# Patient Record
Sex: Male | Born: 1950
Health system: Southern US, Community
[De-identification: ages and names within clinical notes are randomized; demographics above are authoritative.]

## PROBLEM LIST (undated history)

## (undated) DIAGNOSIS — M1990 Unspecified osteoarthritis, unspecified site: Secondary | ICD-10-CM

## (undated) DIAGNOSIS — I10 Essential (primary) hypertension: Secondary | ICD-10-CM

## (undated) DIAGNOSIS — N400 Enlarged prostate without lower urinary tract symptoms: Secondary | ICD-10-CM

## (undated) DIAGNOSIS — G5711 Meralgia paresthetica, right lower limb: Secondary | ICD-10-CM

## (undated) DIAGNOSIS — F419 Anxiety disorder, unspecified: Secondary | ICD-10-CM

## (undated) DIAGNOSIS — N189 Chronic kidney disease, unspecified: Secondary | ICD-10-CM

## (undated) DIAGNOSIS — F32A Depression, unspecified: Secondary | ICD-10-CM

## (undated) DIAGNOSIS — K219 Gastro-esophageal reflux disease without esophagitis: Secondary | ICD-10-CM

## (undated) DIAGNOSIS — H269 Unspecified cataract: Secondary | ICD-10-CM

## (undated) DIAGNOSIS — C4492 Squamous cell carcinoma of skin, unspecified: Secondary | ICD-10-CM

## (undated) DIAGNOSIS — F329 Major depressive disorder, single episode, unspecified: Secondary | ICD-10-CM

## (undated) DIAGNOSIS — Z5189 Encounter for other specified aftercare: Secondary | ICD-10-CM

## (undated) HISTORY — DX: Unspecified cataract: H26.9

## (undated) HISTORY — DX: Unspecified osteoarthritis, unspecified site: M19.90

## (undated) HISTORY — DX: Benign prostatic hyperplasia without lower urinary tract symptoms: N40.0

## (undated) HISTORY — DX: Encounter for other specified aftercare: Z51.89

## (undated) HISTORY — DX: Meralgia paresthetica, right lower limb: G57.11

## (undated) HISTORY — DX: Depression, unspecified: F32.A

## (undated) HISTORY — DX: Essential (primary) hypertension: I10

## (undated) HISTORY — DX: Chronic kidney disease, unspecified: N18.9

## (undated) HISTORY — DX: Major depressive disorder, single episode, unspecified: F32.9

## (undated) HISTORY — DX: Gastro-esophageal reflux disease without esophagitis: K21.9

## (undated) HISTORY — DX: Anxiety disorder, unspecified: F41.9

## (undated) HISTORY — PX: SPLENECTOMY: SUR1306

## (undated) HISTORY — DX: Squamous cell carcinoma of skin, unspecified: C44.92

## (undated) HISTORY — PX: HERNIA REPAIR: SHX51

---

## 2004-12-24 ENCOUNTER — Ambulatory Visit: Payer: Self-pay | Admitting: Internal Medicine

## 2005-06-30 ENCOUNTER — Ambulatory Visit: Payer: Self-pay | Admitting: Internal Medicine

## 2005-07-07 ENCOUNTER — Ambulatory Visit: Payer: Self-pay | Admitting: Internal Medicine

## 2007-02-09 ENCOUNTER — Ambulatory Visit: Payer: Self-pay | Admitting: Internal Medicine

## 2007-02-09 LAB — CONVERTED CEMR LAB
ALT: 20 units/L (ref 0–40)
Albumin: 3.4 g/dL — ABNORMAL LOW (ref 3.5–5.2)
Alkaline Phosphatase: 67 units/L (ref 39–117)
BUN: 10 mg/dL (ref 6–23)
Basophils Absolute: 0.1 10*3/uL (ref 0.0–0.1)
Basophils Relative: 0.8 % (ref 0.0–1.0)
Bilirubin Urine: NEGATIVE
CO2: 35 meq/L — ABNORMAL HIGH (ref 19–32)
Calcium: 9.4 mg/dL (ref 8.4–10.5)
Creatinine, Ser: 0.9 mg/dL (ref 0.4–1.5)
Hemoglobin, Urine: NEGATIVE
Hemoglobin: 15.6 g/dL (ref 13.0–17.0)
Ketones, ur: NEGATIVE mg/dL
LDL Cholesterol: 121 mg/dL — ABNORMAL HIGH (ref 0–99)
Leukocytes, UA: NEGATIVE
MCHC: 34 g/dL (ref 30.0–36.0)
Monocytes Relative: 13.4 % — ABNORMAL HIGH (ref 3.0–11.0)
Platelets: 311 10*3/uL (ref 150–400)
RBC: 4.76 M/uL (ref 4.22–5.81)
RDW: 12.7 % (ref 11.5–14.6)
Specific Gravity, Urine: 1.025 (ref 1.000–1.03)
TSH: 1.68 microintl units/mL (ref 0.35–5.50)
Total Bilirubin: 1 mg/dL (ref 0.3–1.2)
Total Protein, Urine: NEGATIVE mg/dL
Total Protein: 6.8 g/dL (ref 6.0–8.3)
Triglycerides: 124 mg/dL (ref 0–149)
Urine Glucose: NEGATIVE mg/dL
VLDL: 25 mg/dL (ref 0–40)
pH: 7 (ref 5.0–8.0)

## 2007-02-14 ENCOUNTER — Ambulatory Visit: Payer: Self-pay | Admitting: Internal Medicine

## 2007-03-01 ENCOUNTER — Ambulatory Visit: Payer: Self-pay

## 2007-04-06 ENCOUNTER — Ambulatory Visit: Payer: Self-pay | Admitting: Internal Medicine

## 2007-06-25 ENCOUNTER — Encounter: Payer: Self-pay | Admitting: Internal Medicine

## 2007-06-25 DIAGNOSIS — F411 Generalized anxiety disorder: Secondary | ICD-10-CM | POA: Insufficient documentation

## 2007-06-25 DIAGNOSIS — N400 Enlarged prostate without lower urinary tract symptoms: Secondary | ICD-10-CM | POA: Insufficient documentation

## 2007-06-25 DIAGNOSIS — F329 Major depressive disorder, single episode, unspecified: Secondary | ICD-10-CM

## 2007-06-25 DIAGNOSIS — F528 Other sexual dysfunction not due to a substance or known physiological condition: Secondary | ICD-10-CM | POA: Insufficient documentation

## 2007-06-25 DIAGNOSIS — F3289 Other specified depressive episodes: Secondary | ICD-10-CM | POA: Insufficient documentation

## 2007-06-25 DIAGNOSIS — I10 Essential (primary) hypertension: Secondary | ICD-10-CM | POA: Insufficient documentation

## 2007-06-25 DIAGNOSIS — K219 Gastro-esophageal reflux disease without esophagitis: Secondary | ICD-10-CM | POA: Insufficient documentation

## 2009-03-13 ENCOUNTER — Ambulatory Visit: Payer: Self-pay | Admitting: Internal Medicine

## 2009-03-13 LAB — CONVERTED CEMR LAB
Albumin: 3.7 g/dL (ref 3.5–5.2)
BUN: 10 mg/dL (ref 6–23)
Basophils Relative: 3.4 % — ABNORMAL HIGH (ref 0.0–3.0)
Chloride: 103 meq/L (ref 96–112)
Cholesterol: 207 mg/dL — ABNORMAL HIGH (ref 0–200)
Creatinine, Ser: 1 mg/dL (ref 0.4–1.5)
Direct LDL: 150.3 mg/dL
Eosinophils Relative: 1.4 % (ref 0.0–5.0)
GFR calc non Af Amer: 81.25 mL/min (ref >60)
HDL: 46.4 mg/dL (ref >39.00)
Hemoglobin, Urine: NEGATIVE
Leukocytes, UA: NEGATIVE
MCV: 96.8 fL (ref 78.0–100.0)
Monocytes Relative: 12.2 % — ABNORMAL HIGH (ref 3.0–12.0)
Neutrophils Relative %: 50.8 % (ref 43.0–77.0)
Nitrite: NEGATIVE
PSA: 2.2 ng/mL (ref 0.10–4.00)
Platelets: 290 10*3/uL (ref 150.0–400.0)
Potassium: 3.9 meq/L (ref 3.5–5.1)
RBC: 4.9 M/uL (ref 4.22–5.81)
Specific Gravity, Urine: 1.01 (ref 1.000–1.030)
TSH: 1.88 microintl units/mL (ref 0.35–5.50)
Total CHOL/HDL Ratio: 4
Total Protein: 7.3 g/dL (ref 6.0–8.3)
Urobilinogen, UA: 0.2 (ref 0.0–1.0)
VLDL: 13.4 mg/dL (ref 0.0–40.0)
WBC: 7.2 10*3/uL (ref 4.5–10.5)

## 2009-03-19 ENCOUNTER — Ambulatory Visit: Payer: Self-pay | Admitting: Internal Medicine

## 2009-03-19 DIAGNOSIS — G5710 Meralgia paresthetica, unspecified lower limb: Secondary | ICD-10-CM | POA: Insufficient documentation

## 2010-05-15 ENCOUNTER — Ambulatory Visit: Payer: Self-pay | Admitting: Internal Medicine

## 2010-05-19 LAB — CONVERTED CEMR LAB
ALT: 20 units/L (ref 0–53)
AST: 26 units/L (ref 0–37)
Albumin: 3.9 g/dL (ref 3.5–5.2)
Alkaline Phosphatase: 67 units/L (ref 39–117)
BUN: 19 mg/dL (ref 6–23)
Basophils Absolute: 0.1 10*3/uL (ref 0.0–0.1)
Bilirubin Urine: NEGATIVE
Calcium: 9.5 mg/dL (ref 8.4–10.5)
Chloride: 99 meq/L (ref 96–112)
Cholesterol: 206 mg/dL — ABNORMAL HIGH (ref 0–200)
Creatinine, Ser: 1 mg/dL (ref 0.4–1.5)
GFR calc non Af Amer: 85.86 mL/min (ref >60)
HCT: 46.3 % (ref 39.0–52.0)
Ketones, ur: NEGATIVE mg/dL
Leukocytes, UA: NEGATIVE
Lymphs Abs: 2.2 10*3/uL (ref 0.7–4.0)
MCV: 98.3 fL (ref 78.0–100.0)
Monocytes Absolute: 0.9 10*3/uL (ref 0.1–1.0)
Monocytes Relative: 10.6 % (ref 3.0–12.0)
Neutrophils Relative %: 59.9 % (ref 43.0–77.0)
Platelets: 316 10*3/uL (ref 150.0–400.0)
RDW: 13.9 % (ref 11.5–14.6)
TSH: 1.29 microintl units/mL (ref 0.35–5.50)
Total CHOL/HDL Ratio: 5
Urobilinogen, UA: 0.2 (ref 0.0–1.0)

## 2010-05-23 ENCOUNTER — Ambulatory Visit: Payer: Self-pay | Admitting: Internal Medicine

## 2010-05-23 ENCOUNTER — Encounter: Payer: Self-pay | Admitting: Internal Medicine

## 2010-05-23 DIAGNOSIS — K439 Ventral hernia without obstruction or gangrene: Secondary | ICD-10-CM | POA: Insufficient documentation

## 2010-06-12 ENCOUNTER — Encounter: Payer: Self-pay | Admitting: Internal Medicine

## 2010-08-08 ENCOUNTER — Encounter (INDEPENDENT_AMBULATORY_CARE_PROVIDER_SITE_OTHER): Payer: Self-pay | Admitting: *Deleted

## 2010-08-14 ENCOUNTER — Ambulatory Visit (HOSPITAL_COMMUNITY): Admission: RE | Admit: 2010-08-14 | Discharge: 2010-08-15 | Payer: Self-pay | Admitting: General Surgery

## 2010-09-22 ENCOUNTER — Ambulatory Visit: Payer: Self-pay | Admitting: Internal Medicine

## 2010-11-26 NOTE — Letter (Signed)
Summary: Referral - not able to see patient  [facility]  [address] [place], [place] [address]   Phone: [phone]  Fax: [phone]    August 08, 2010   Georgina Quint. Plotnikov, M.D. [address] [place], [place] [address]   Re:   Jeffrey [name] DOB:  Apr 14, 1951 MRN:   [id number]    Dear Dr. Posey Rea:  Thank you for your kind referral of the above patient.  We have attempted to schedule the recommended procedure Screening Colonoscopy but have not been able to schedule because:   X  The patient was not available by phone and/or has not returned our calls.  ___ The patient declined to schedule the procedure at this time.  We appreciate the referral and hope that we will have the opportunity to treat this patient in the future.    Sincerely,    [facility] [phone]

## 2010-11-26 NOTE — Consult Note (Signed)
Summary: [facility]   Imported By: Lester Bennett 07/03/2010 09:53:14  _____________________________________________________________________  External Attachment:    Type:   Image     Comment:   External Document

## 2010-11-26 NOTE — Assessment & Plan Note (Signed)
Summary: CPX/WELLPATH/#/CD   Vital Signs:  Patient profile:   60 year old male Height:      72 inches Weight:      250 pounds BMI:     34.03 O2 Sat:      94 % on Room air Temp:     98.4 degrees F oral Pulse rate:   83 / minute Pulse rhythm:   regular Resp:     16 per minute BP sitting:   140 / 90  (left arm) Cuff size:   regular  Vitals Entered By: Lanier Prude, CMA(AAMA) (May 23, 2010 10:45 AM)  O2 Flow:  Room air CC: CPX Is Patient Diabetic? No Comments pt states he is not  taking Ibuprofen or KlorCon   CC:  CPX.  History of Present Illness: The patient presents for a wellness examination  C/o R abd swelling and pain  Current Medications (verified): 1)  Flomax 0.4 Mg  Cp24 (Tamsulosin Hcl) .... Once Daily 2)  Accuretic 20-12.5 Mg  Tabs (Quinapril-Hydrochlorothiazide) .... Two Tabs Q Am 3)  Adult Aspirin Ec Low Strength 81 Mg  Tbec (Aspirin) .... Once Daily 4)  Klor-Con 20 Meq  Pack (Potassium Chloride) .... Once Daily 5)  Vitamin D 1000 Unit  Tabs (Cholecalciferol) .... Once Daily 6)  Ultram 50 Mg  Tabs (Tramadol Hcl) .... Qid As Needed 7)  Ibuprofen 600 Mg  Tabs (Ibuprofen) .... Qid As Needed 8)  Prilosec 20 Mg Cpdr (Omeprazole) .Marland Kitchen.. 1 Once Daily  Allergies (verified): 1)  ! Wellbutrin (Bupropion Hcl) 2)  ! Effexor 3)  ! Mevacor 4)  ! * Higher Dose of Accuretic  Past History:  Past Medical History: Last updated: 03/19/2009 Anxiety Depression GERD Hypertension Benign prostatic hypertrophy M paresthetica R  Past Surgical History: Last updated: 03/19/2009 Splenectomy  1988 Inguinal herniorrhaphy R  Family History: Last updated: 03/19/2009 HTN  Social History: Last updated: 05/23/2010 Occupation new job - :travels a lot Married Never Smoked Alcohol use-no Drug use-no Regular exercise-no  Social History: Occupation new job - :travels a lot Married Never Smoked Alcohol use-no Drug use-no Regular exercise-no  Review of Systems   The patient complains of abdominal pain.  The patient denies anorexia, fever, weight loss, weight gain, vision loss, decreased hearing, hoarseness, chest pain, syncope, dyspnea on exertion, peripheral edema, prolonged cough, headaches, hemoptysis, melena, hematochezia, severe indigestion/heartburn, hematuria, incontinence, genital sores, muscle weakness, suspicious skin lesions, transient blindness, difficulty walking, depression, unusual weight change, abnormal bleeding, enlarged lymph nodes, angioedema, and testicular masses.    Physical Exam  General:  overweight-appearing.   Head:  Normocephalic and atraumatic without obvious abnormalities. No apparent alopecia or balding. Eyes:  No corneal or conjunctival inflammation noted. EOMI. Perrla. Ears:  External ear exam shows no significant lesions or deformities.  Otoscopic examination reveals clear canals, tympanic membranes are intact bilaterally without bulging, retraction, inflammation or discharge. Hearing is grossly normal bilaterally. Nose:  External nasal examination shows no deformity or inflammation. Nasal mucosa are pink and moist without lesions or exudates. Mouth:  Oral mucosa and oropharynx without lesions or exudates.  Teeth in good repair. Neck:  No deformities, masses, or tenderness noted. Chest Wall:  No deformities, masses, tenderness or gynecomastia noted. Lungs:  Normal respiratory effort, chest expands symmetrically. Lungs are clear to auscultation, no crackles or wheezes. Heart:  Normal rate and regular rhythm. S1 and S2 normal without gallop, murmur, click, rub or other extra sounds. Abdomen:  Bowel sounds positive,abdomen large  soft and non-tender without masses,  organomegaly  A large midline hernia more to R is noted again and it  is bigger now. Midline scar is present Rectal:  No external abnormalities noted. Normal sphincter tone. No rectal masses or tenderness. G(-) Genitalia:  Testes bilaterally descended without  nodularity, tenderness or masses. No scrotal masses or lesions. No penis lesions or urethral discharge. Prostate:  1+ enlarged.   Msk:  No deformity or scoliosis noted of thoracic or lumbar spine.   Pulses:  R and L carotid,radial,femoral,dorsalis pedis and posterior tibial pulses are full and equal bilaterally Extremities:  No clubbing, cyanosis, edema, or deformity noted with normal full range of motion of all joints.   Neurologic:  No cranial nerve deficits noted. Station and gait are normal. Plantar reflexes are down-going bilaterally. DTRs are symmetrical throughout. Sensory, motor and coordinative functions appear intact. Skin:  Intact without suspicious lesions or rashes Cervical Nodes:  No lymphadenopathy noted Inguinal Nodes:  No significant adenopathy Psych:  Cognition and judgment appear intact. Alert and cooperative with normal attention span and concentration. No apparent delusions, illusions, hallucinations   Impression & Recommendations:  Problem # 1:  WELL ADULT EXAM (ICD-V70.0) Assessment New Health and age related issues were discussed. Available screening tests and vaccinations were discussed as well. Healthy life style including good diet and execise was discussed.  The labs were reviewed with the patient.  Orders: EKG w/ Interpretation (93000) Gastroenterology Referral (GI) colon  Problem # 2:  VENTRAL HERNIA (ICD-553.20) Assessment: Deteriorated  Orders: Surgical Referral (Surgery)  Problem # 3:  HYPERTENSION (ICD-401.9) Assessment: Unchanged  His updated medication list for this problem includes:    Accuretic 20-12.5 Mg Tabs (Quinapril-hydrochlorothiazide) .Marland Kitchen..Marland Kitchen Two tabs q am  BP today: 140/90 Prior BP: 124/86 (03/19/2009)  Labs Reviewed: K+: 4.1 (05/15/2010) Creat: : 1.0 (05/15/2010)   Chol: 206 (05/15/2010)   HDL: 43.20 (05/15/2010)   LDL: 121 (02/09/2007)   TG: 99.0 (05/15/2010)  Problem # 4:  DEPRESSION (ICD-311) Assessment: Improved  Problem #  5:  ERECTILE DYSFUNCTION (ICD-302.72) Assessment: Unchanged  Problem # 6:  SPLENECTOMY, TOTAL, HX OF (ICD-V45.79) Assessment: Comment Only Will vaccinate today Augmentin Rx  Problem # 7:  PSA, INCREASED (ICD-790.93) Assessment: Comment Only  Problem # 8:  MERALGIA PARESTHETICA (ICD-355.1) Assessment: Improved  Complete Medication List: 1)  Flomax 0.4 Mg Cp24 (Tamsulosin hcl) .... Once daily 2)  Accuretic 20-12.5 Mg Tabs (Quinapril-hydrochlorothiazide) .... Two tabs q am 3)  Adult Aspirin Ec Low Strength 81 Mg Tbec (Aspirin) .... Once daily 4)  Klor-con 20 Meq Pack (Potassium chloride) .... Once daily 5)  Vitamin D 1000 Unit Tabs (Cholecalciferol) .... Once daily 6)  Ultram 50 Mg Tabs (Tramadol hcl) .... Qid as needed 7)  Ibuprofen 600 Mg Tabs (Ibuprofen) .... Qid as needed 8)  Prilosec 20 Mg Cpdr (Omeprazole) .Marland Kitchen.. 1 once daily 9)  Augmentin 875-125 Mg Tabs (Amoxicillin-pot clavulanate) .Marland Kitchen.. 1 by mouth bid  Other Orders: Pneumococcal Vaccine ([address]) Admin 1st Vaccine (60454) Menactra IM (09811) Admin of Any Addtl Vaccine (91478)  Patient Instructions: 1)  Please schedule a follow-up appointment in 4 months. 2)  BMP prior to visit, ICD-9: 3)  Lipid Panel prior to visit, ICD-9:272.0 4)  PSA prior to visit, ICD-9:596.0 Prescriptions: AUGMENTIN 875-125 MG TABS (AMOXICILLIN-POT CLAVULANATE) 1 by mouth bid  #20 x 1   Entered and Authorized by:   Tresa Garter MD   Signed by:   Tresa Garter MD on 05/23/2010   Method used:   Print then Give to  Patient   RxID:   [id number] ACCURETIC 20-12.5 MG  TABS (QUINAPRIL-HYDROCHLOROTHIAZIDE) two tabs q am  #60 Tablet x 11   Entered and Authorized by:   Tresa Garter MD   Signed by:   Tresa Garter MD on 05/23/2010   Method used:   Print then Give to Patient   RxID:   [id number] FLOMAX 0.4 MG  CP24 (TAMSULOSIN HCL) once daily  #30 Capsule x 11   Entered and Authorized by:   Tresa Garter MD    Signed by:   Tresa Garter MD on 05/23/2010   Method used:   Print then Give to Patient   RxID:   [id number]    Immunizations Administered:  Pneumonia Vaccine:    Vaccine Type: Pneumovax    Site: left deltoid    Mfr: Merck    Dose: 0.5 ml    Route: IM    Given by: Lanier Prude, CMA(AAMA)    Exp. Date: 11/12/2011    Lot #: [id number]    VIS given: 05/23/96 version given May 23, 2010.  Meningococcal Vaccine:    Vaccine Type: Menactra    Site: right deltoid    Mfr: Sanofi Pasteur    Dose: 0.5 ml    Route: IM    Given by: Lanier Prude, CMA(AAMA)    Exp. Date: 07/14/2010    Lot #: [id number]    VIS given: 11/22/06 version given May 23, 2010.

## 2011-01-07 LAB — SURGICAL PCR SCREEN: Staphylococcus aureus: POSITIVE — AB

## 2011-01-07 LAB — COMPREHENSIVE METABOLIC PANEL
Alkaline Phosphatase: 65 U/L (ref 39–117)
BUN: 9 mg/dL (ref 6–23)
Calcium: 9.1 mg/dL (ref 8.4–10.5)
Glucose, Bld: 93 mg/dL (ref 70–99)
Total Protein: 6.9 g/dL (ref 6.0–8.3)

## 2011-01-07 LAB — DIFFERENTIAL
Basophils Relative: 1 % (ref 0–1)
Lymphs Abs: 2 10*3/uL (ref 0.7–4.0)
Monocytes Relative: 12 % (ref 3–12)
Neutro Abs: 3.6 10*3/uL (ref 1.7–7.7)
Neutrophils Relative %: 56 % (ref 43–77)

## 2011-01-07 LAB — CBC
HCT: 45.7 % (ref 39.0–52.0)
MCH: 33 pg (ref 26.0–34.0)
MCHC: 34 g/dL (ref 30.0–36.0)
MCV: 97.1 fL (ref 78.0–100.0)
RDW: 13.7 % (ref 11.5–15.5)

## 2011-01-07 LAB — PROTIME-INR: INR: 0.93 (ref 0.00–1.49)

## 2011-01-07 LAB — TYPE AND SCREEN: ABO/RH(D): B POS

## 2011-03-13 NOTE — Assessment & Plan Note (Signed)
[facility]                           PRIMARY CARE OFFICE NOTE   CALEM, COCOZZA                       MRN:          [id number]  DATE:02/14/2007                            DOB:          1951/08/17    HISTORY:  The patient is a 60 year old male who presents for a wellness  examination.  Past medical history, family history, and social history  as per July 07, 2005 note.  He got remarried about a year ago.   REVIEW OF SYSTEMS:  No chest pain or shortness of breath.  No syncope.  No neurologic complaints.  He has some myalgias and arthralgias that he  attributes to Mevacor.  The rest of the 18-point review of systems is  negative.   MEDICINES:  Reviewed with the patient.   PHYSICAL EXAMINATION:  VITAL SIGNS:  Blood pressure 151/89.  Pulse 65.  Temperature 98.8.  Weight 249 pounds (was 243).  Looks well.  HEENT:  Moist mucosa.  NECK:  Supple.  Mild right-sided bruit.  No thyromegaly.  LUNGS:  Clear.  No wheezes or rales.  HEART:  S1, S2.  No murmur, no gallop.  ABDOMEN:  Soft, nontender.  No organomegaly, no mass.  EXTREMITIES:  Lower extremities without edema.  NEUROLOGIC:  He is alert, oriented, cooperative.  Denies being  depressed.  SKIN:  Clear with aging changes.  There is AK lesion on the left ear.  RECTAL:  Normal.  Prostate without nodules, no masses.  Stool guaiac  negative.   LABORATORY DATA:  On February 09, 2007 reveal CBC normal.  Total  cholesterol 190, LDL 121.  TSH normal.  PSA normal.  Urinalysis normal.  EKG today with normal sinus rhythm.   ASSESSMENT/PLAN:  1. Normal wellness examination.  Age/health related issues discussed.      Healthy lifestyle discussed.  Weight loss and regular exercise      emphasized.  He has been actually playing in a softball league.  2. Hypertension with elevated blood pressure discussed.  He will try      to lose weight.  3. Weight gain discussed:  Diet.  4. Elevated glucose.  He will try  to lose weight and stay on a low      carbohydrate diet.  5. Myalgias.  We will discontinue Mevacor.  We will recheck in three      months.  6. Dyslipidemia.  May start on Crestor low dose every other day, if      tolerated.  7. Right carotid bruit.  Repeat ultrasound.     Jeffrey Quint. Plotnikov, MD  Electronically Signed   AVP/MedQ  DD: 02/15/2007  DT: 02/15/2007  Job #: [id number]

## 2011-05-07 ENCOUNTER — Other Ambulatory Visit: Payer: Self-pay | Admitting: Internal Medicine

## 2011-05-08 ENCOUNTER — Other Ambulatory Visit: Payer: Self-pay | Admitting: Internal Medicine

## 2011-06-18 ENCOUNTER — Encounter: Payer: Self-pay | Admitting: Internal Medicine

## 2011-06-18 DIAGNOSIS — Z0000 Encounter for general adult medical examination without abnormal findings: Secondary | ICD-10-CM

## 2011-06-18 DIAGNOSIS — Z0389 Encounter for observation for other suspected diseases and conditions ruled out: Secondary | ICD-10-CM

## 2011-06-19 ENCOUNTER — Other Ambulatory Visit (INDEPENDENT_AMBULATORY_CARE_PROVIDER_SITE_OTHER): Payer: Self-pay

## 2011-06-19 ENCOUNTER — Other Ambulatory Visit: Payer: Self-pay

## 2011-06-19 DIAGNOSIS — Z0000 Encounter for general adult medical examination without abnormal findings: Secondary | ICD-10-CM

## 2011-06-19 DIAGNOSIS — Z0389 Encounter for observation for other suspected diseases and conditions ruled out: Secondary | ICD-10-CM

## 2011-06-19 LAB — URINALYSIS, ROUTINE W REFLEX MICROSCOPIC
Bilirubin Urine: NEGATIVE
Nitrite: NEGATIVE
Total Protein, Urine: NEGATIVE
pH: 7 (ref 5.0–8.0)

## 2011-06-19 LAB — COMPREHENSIVE METABOLIC PANEL
Albumin: 3.5 g/dL (ref 3.5–5.2)
CO2: 34 mEq/L — ABNORMAL HIGH (ref 19–32)
Chloride: 102 mEq/L (ref 96–112)
GFR: 83.52 mL/min (ref >60.00)
Glucose, Bld: 92 mg/dL (ref 70–99)
Potassium: 4.1 mEq/L (ref 3.5–5.1)
Sodium: 142 mEq/L (ref 135–145)
Total Protein: 6.6 g/dL (ref 6.0–8.3)

## 2011-06-19 LAB — CBC WITH DIFFERENTIAL/PLATELET
Basophils Absolute: 0 10*3/uL (ref 0.0–0.1)
Eosinophils Relative: 1.3 % (ref 0.0–5.0)
Monocytes Absolute: 0.9 10*3/uL (ref 0.1–1.0)
Monocytes Relative: 12.1 % — ABNORMAL HIGH (ref 3.0–12.0)
Neutrophils Relative %: 58 % (ref 43.0–77.0)
Platelets: 347 10*3/uL (ref 150.0–400.0)
RDW: 13.7 % (ref 11.5–14.6)
WBC: 7.6 10*3/uL (ref 4.5–10.5)

## 2011-06-19 LAB — TSH: TSH: 1.52 u[IU]/mL (ref 0.35–5.50)

## 2011-06-25 ENCOUNTER — Ambulatory Visit (INDEPENDENT_AMBULATORY_CARE_PROVIDER_SITE_OTHER): Payer: No Typology Code available for payment source | Admitting: Internal Medicine

## 2011-06-25 VITALS — BP 128/82 | HR 68 | Temp 98.4°F | Wt 249.0 lb

## 2011-06-25 DIAGNOSIS — I10 Essential (primary) hypertension: Secondary | ICD-10-CM

## 2011-06-25 DIAGNOSIS — Z9081 Acquired absence of spleen: Secondary | ICD-10-CM

## 2011-06-25 DIAGNOSIS — N400 Enlarged prostate without lower urinary tract symptoms: Secondary | ICD-10-CM

## 2011-06-25 DIAGNOSIS — R972 Elevated prostate specific antigen [PSA]: Secondary | ICD-10-CM

## 2011-06-25 DIAGNOSIS — Z9089 Acquired absence of other organs: Secondary | ICD-10-CM

## 2011-06-25 DIAGNOSIS — K439 Ventral hernia without obstruction or gangrene: Secondary | ICD-10-CM

## 2011-06-25 DIAGNOSIS — Z0000 Encounter for general adult medical examination without abnormal findings: Secondary | ICD-10-CM

## 2011-06-25 DIAGNOSIS — Z136 Encounter for screening for cardiovascular disorders: Secondary | ICD-10-CM

## 2011-06-25 MED ORDER — FINASTERIDE 5 MG PO TABS: 5.0000 mg | ORAL_TABLET | Freq: Every day | ORAL | Status: DC

## 2011-06-25 MED ORDER — AMOXICILLIN-POT CLAVULANATE 875-125 MG PO TABS: 1.0000 | ORAL_TABLET | Freq: Two times a day (BID) | ORAL | Status: AC

## 2011-06-25 MED ORDER — QUINAPRIL-HYDROCHLOROTHIAZIDE 20-12.5 MG PO TABS: 1.0000 | ORAL_TABLET | Freq: Every day | ORAL | Status: DC

## 2011-06-25 MED ORDER — TAMSULOSIN HCL 0.4 MG PO CAPS: 0.4000 mg | ORAL_CAPSULE | Freq: Every day | ORAL | Status: DC

## 2011-06-25 MED ORDER — TADALAFIL 5 MG PO TABS: 5.0000 mg | ORAL_TABLET | Freq: Every day | ORAL | Status: DC

## 2011-06-25 NOTE — Assessment & Plan Note (Signed)
Start Proscar Urol consult

## 2011-06-25 NOTE — Progress Notes (Signed)
Subjective:    Patient ID: Jeffrey [name], male    DOB: 07/16/1951, 60 y.o.   MRN: [id number]  HPI  The patient is here for a wellness exam. The patient has been doing well overall without major physical or psychological issues going on lately. Review of Systems  Constitutional: Negative for fever, appetite change, fatigue and unexpected weight change.  HENT: Negative for nosebleeds, congestion, sore throat, sneezing, trouble swallowing and neck pain.   Eyes: Negative for itching and visual disturbance.  Respiratory: Positive for shortness of breath. Negative for cough.   Cardiovascular: Negative for chest pain, palpitations and leg swelling.  Gastrointestinal: Negative for nausea, diarrhea, blood in stool, abdominal distention and anal bleeding.  Genitourinary: Negative for frequency and hematuria.  Musculoskeletal: Negative for back pain, joint swelling and gait problem.  Skin: Negative for rash.  Neurological: Negative for dizziness, tremors, speech difficulty and weakness.  Psychiatric/Behavioral: Negative for suicidal ideas, sleep disturbance, dysphoric mood and agitation. The patient is not nervous/anxious and is not hyperactive.    Wt Readings from Last 3 Encounters:  06/25/11 249 lb (112.946 kg)  05/23/10 250 lb (113.399 kg)  03/19/09 249 lb (112.946 kg)   BP Readings from Last 3 Encounters:  06/25/11 128/82  05/23/10 140/90  03/19/09 124/86        Objective:   Physical Exam  Constitutional: He is oriented to person, place, and time. He appears well-developed. No distress.       Obese  HENT:  Head: Normocephalic and atraumatic.  Right Ear: External ear normal.  Left Ear: External ear normal.  Nose: Nose normal.  Mouth/Throat: Oropharynx is clear and moist. No oropharyngeal exudate.  Eyes: Conjunctivae and EOM are normal. Pupils are equal, round, and reactive to light. Right eye exhibits no discharge. Left eye exhibits no discharge. No scleral icterus.  Neck: Normal  range of motion. Neck supple. No JVD present. No tracheal deviation present. No thyromegaly present.  Cardiovascular: Normal rate, regular rhythm, normal heart sounds and intact distal pulses.  Exam reveals no gallop and no friction rub.   No murmur heard. Pulmonary/Chest: Effort normal and breath sounds normal. No stridor. No respiratory distress. He has no wheezes. He has no rales. He exhibits no tenderness.  Abdominal: Soft. Bowel sounds are normal. He exhibits mass (hernia is better). He exhibits no distension. There is no tenderness. There is no rebound and no guarding.  Genitourinary: Rectum normal and penis normal. Guaiac negative stool. No penile tenderness.       R>L lobe prostate. No nodules. 1+. NT  Musculoskeletal: Normal range of motion. He exhibits no edema and no tenderness.  Lymphadenopathy:    He has no cervical adenopathy.  Neurological: He is alert and oriented to person, place, and time. He has normal reflexes. No cranial nerve deficit. He exhibits normal muscle tone. Coordination normal.  Skin: Skin is warm and dry. No rash noted. He is not diaphoretic. No erythema. No pallor.  Psychiatric: He has a normal mood and affect. His behavior is normal. Judgment and thought content normal.     Lab Results  Component Value Date   WBC 7.6 06/19/2011   HGB 15.1 06/19/2011   HCT 46.5 06/19/2011   PLT 347.0 06/19/2011   CHOL 188 06/19/2011   TRIG 62.0 06/19/2011   HDL 43.50 06/19/2011   LDLDIRECT 151.8 05/15/2010   ALT 15 06/19/2011   AST 19 06/19/2011   NA 142 06/19/2011   K 4.1 06/19/2011   CL 102 06/19/2011  CREATININE 1.0 06/19/2011   BUN 16 06/19/2011   CO2 34* 06/19/2011   TSH 1.52 06/19/2011   PSA 6.28* 06/19/2011   INR 0.93 08/12/2010        Assessment & Plan:

## 2011-06-25 NOTE — Assessment & Plan Note (Signed)
tD <10 y Zostavax advised We discussed age appropriate health related issues, including available/recomended screening tests and vaccinations. We discussed a need for adhering to healthy diet and exercise. Labs/EKG were reviewed/ordered. All questions were answered.

## 2011-06-26 ENCOUNTER — Encounter: Payer: Self-pay | Admitting: Internal Medicine

## 2011-06-26 NOTE — Assessment & Plan Note (Signed)
Urol cons in the view of elevated PSA Added Proscar

## 2011-06-26 NOTE — Assessment & Plan Note (Signed)
Doing well 

## 2011-06-26 NOTE — Assessment & Plan Note (Signed)
Continue with current prescription therapy as reflected on the Med list.  

## 2011-06-26 NOTE — Assessment & Plan Note (Signed)
Vaccines Augmentin

## 2011-07-04 ENCOUNTER — Other Ambulatory Visit: Payer: Self-pay | Admitting: Internal Medicine

## 2011-07-06 ENCOUNTER — Other Ambulatory Visit: Payer: Self-pay | Admitting: Internal Medicine

## 2012-02-04 ENCOUNTER — Other Ambulatory Visit: Payer: Self-pay | Admitting: Internal Medicine

## 2012-02-06 ENCOUNTER — Other Ambulatory Visit: Payer: Self-pay | Admitting: Internal Medicine

## 2012-02-08 NOTE — Telephone Encounter (Signed)
Received refill request for quinapril hct 3 month supply. Pt was given 30 day supply with 11 refills in 05/2011. Called Paula at [facility] and cancelled remaining refills from 05/2011 Rx and sent 90 day supply with no refills. Pt was due for f/u in February. Please call pt to arrange f/u.

## 2012-02-24 NOTE — Telephone Encounter (Signed)
HAVE LEFT MESSAGES TO CALL FOR APPT

## 2012-03-18 ENCOUNTER — Other Ambulatory Visit: Payer: Self-pay | Admitting: Internal Medicine

## 2012-06-25 ENCOUNTER — Other Ambulatory Visit: Payer: Self-pay | Admitting: Internal Medicine

## 2012-07-15 ENCOUNTER — Other Ambulatory Visit: Payer: Self-pay | Admitting: Internal Medicine

## 2012-09-02 ENCOUNTER — Other Ambulatory Visit (INDEPENDENT_AMBULATORY_CARE_PROVIDER_SITE_OTHER): Payer: No Typology Code available for payment source

## 2012-09-02 ENCOUNTER — Other Ambulatory Visit: Payer: Self-pay | Admitting: *Deleted

## 2012-09-02 ENCOUNTER — Other Ambulatory Visit: Payer: No Typology Code available for payment source

## 2012-09-02 DIAGNOSIS — Z0000 Encounter for general adult medical examination without abnormal findings: Secondary | ICD-10-CM

## 2012-09-02 DIAGNOSIS — Z0389 Encounter for observation for other suspected diseases and conditions ruled out: Secondary | ICD-10-CM

## 2012-09-02 LAB — CBC WITH DIFFERENTIAL/PLATELET
Basophils Absolute: 0.1 10*3/uL (ref 0.0–0.1)
Basophils Relative: 0.7 % (ref 0.0–3.0)
Eosinophils Absolute: 0.1 10*3/uL (ref 0.0–0.7)
Hemoglobin: 15.4 g/dL (ref 13.0–17.0)
Lymphocytes Relative: 28.2 % (ref 12.0–46.0)
MCHC: 33 g/dL (ref 30.0–36.0)
Monocytes Relative: 12.3 % — ABNORMAL HIGH (ref 3.0–12.0)
Neutro Abs: 4.8 10*3/uL (ref 1.4–7.7)
Neutrophils Relative %: 57.8 % (ref 43.0–77.0)
RBC: 4.78 Mil/uL (ref 4.22–5.81)

## 2012-09-02 LAB — URINALYSIS, ROUTINE W REFLEX MICROSCOPIC
Leukocytes, UA: NEGATIVE
Nitrite: NEGATIVE
Specific Gravity, Urine: 1.015 (ref 1.000–1.030)
Urobilinogen, UA: 0.2 (ref 0.0–1.0)
pH: 7 (ref 5.0–8.0)

## 2012-09-02 LAB — HEPATIC FUNCTION PANEL
Albumin: 3.5 g/dL (ref 3.5–5.2)
Total Protein: 6.8 g/dL (ref 6.0–8.3)

## 2012-09-02 LAB — LIPID PANEL
HDL: 43.2 mg/dL (ref >39.00)
Total CHOL/HDL Ratio: 5
Triglycerides: 87 mg/dL (ref 0.0–149.0)
VLDL: 17.4 mg/dL (ref 0.0–40.0)

## 2012-09-02 LAB — BASIC METABOLIC PANEL
CO2: 34 mEq/L — ABNORMAL HIGH (ref 19–32)
Calcium: 9.5 mg/dL (ref 8.4–10.5)
Creatinine, Ser: 0.9 mg/dL (ref 0.4–1.5)
GFR: 94.31 mL/min (ref >60.00)
Glucose, Bld: 83 mg/dL (ref 70–99)

## 2012-09-02 LAB — PSA: PSA: 1.74 ng/mL (ref 0.10–4.00)

## 2012-09-02 LAB — TSH: TSH: 1.57 u[IU]/mL (ref 0.35–5.50)

## 2012-09-09 ENCOUNTER — Encounter: Payer: Self-pay | Admitting: Internal Medicine

## 2012-09-09 ENCOUNTER — Ambulatory Visit (INDEPENDENT_AMBULATORY_CARE_PROVIDER_SITE_OTHER): Payer: BC Managed Care – PPO | Admitting: Internal Medicine

## 2012-09-09 VITALS — BP 130/76 | HR 84 | Temp 98.1°F | Resp 16 | Ht 72.0 in | Wt 243.0 lb

## 2012-09-09 DIAGNOSIS — Z1211 Encounter for screening for malignant neoplasm of colon: Secondary | ICD-10-CM

## 2012-09-09 DIAGNOSIS — Z0000 Encounter for general adult medical examination without abnormal findings: Secondary | ICD-10-CM

## 2012-09-09 DIAGNOSIS — R972 Elevated prostate specific antigen [PSA]: Secondary | ICD-10-CM

## 2012-09-09 DIAGNOSIS — L570 Actinic keratosis: Secondary | ICD-10-CM

## 2012-09-09 DIAGNOSIS — N400 Enlarged prostate without lower urinary tract symptoms: Secondary | ICD-10-CM

## 2012-09-09 DIAGNOSIS — G5710 Meralgia paresthetica, unspecified lower limb: Secondary | ICD-10-CM

## 2012-09-09 DIAGNOSIS — Z9081 Acquired absence of spleen: Secondary | ICD-10-CM

## 2012-09-09 DIAGNOSIS — I10 Essential (primary) hypertension: Secondary | ICD-10-CM

## 2012-09-09 DIAGNOSIS — Z9089 Acquired absence of other organs: Secondary | ICD-10-CM

## 2012-09-09 DIAGNOSIS — F528 Other sexual dysfunction not due to a substance or known physiological condition: Secondary | ICD-10-CM

## 2012-09-09 MED ORDER — OMEPRAZOLE 20 MG PO CPDR: 20.0000 mg | DELAYED_RELEASE_CAPSULE | Freq: Every day | ORAL | Status: DC

## 2012-09-09 MED ORDER — FINASTERIDE 5 MG PO TABS: 5.0000 mg | ORAL_TABLET | Freq: Every day | ORAL | Status: DC

## 2012-09-09 MED ORDER — TAMSULOSIN HCL 0.4 MG PO CAPS: 0.4000 mg | ORAL_CAPSULE | Freq: Every day | ORAL | Status: DC

## 2012-09-09 MED ORDER — QUINAPRIL-HYDROCHLOROTHIAZIDE 20-12.5 MG PO TABS: 2.0000 | ORAL_TABLET | Freq: Every day | ORAL | Status: DC

## 2012-09-09 MED ORDER — AMOXICILLIN-POT CLAVULANATE 875-125 MG PO TABS: 1.0000 | ORAL_TABLET | Freq: Two times a day (BID) | ORAL | Status: DC

## 2012-09-09 NOTE — Assessment & Plan Note (Signed)
augmentin  reniewed

## 2012-09-09 NOTE — Assessment & Plan Note (Signed)
Continue with current prescription therapy as reflected on the Med list.  

## 2012-09-09 NOTE — Progress Notes (Signed)
Subjective:    HPI  The patient is here for a wellness exam. The patient has been doing well overall. Mother died last month 62 yo.   Review of Systems  Constitutional: Negative for fever, appetite change, fatigue and unexpected weight change.  HENT: Negative for nosebleeds, congestion, sore throat, sneezing, trouble swallowing and neck pain.   Eyes: Negative for itching and visual disturbance.  Respiratory: Positive for shortness of breath. Negative for cough.   Cardiovascular: Negative for chest pain, palpitations and leg swelling.  Gastrointestinal: Negative for nausea, diarrhea, blood in stool, abdominal distention and anal bleeding.  Genitourinary: Negative for frequency and hematuria.  Musculoskeletal: Negative for back pain, joint swelling and gait problem.  Skin: Negative for rash.  Neurological: Negative for dizziness, tremors, speech difficulty and weakness.  Psychiatric/Behavioral: Negative for suicidal ideas, sleep disturbance, dysphoric mood and agitation. The patient is not nervous/anxious and is not hyperactive.    Wt Readings from Last 3 Encounters:  09/09/12 243 lb (110.224 kg)  06/25/11 249 lb (112.946 kg)  05/23/10 250 lb (113.399 kg)   BP Readings from Last 3 Encounters:  09/09/12 130/76  06/25/11 128/82  05/23/10 140/90        Objective:   Physical Exam  Constitutional: He is oriented to person, place, and time. He appears well-developed. No distress.       Obese  HENT:  Head: Normocephalic and atraumatic.  Right Ear: External ear normal.  Left Ear: External ear normal.  Nose: Nose normal.  Mouth/Throat: Oropharynx is clear and moist. No oropharyngeal exudate.  Eyes: Conjunctivae normal and EOM are normal. Pupils are equal, round, and reactive to light. Right eye exhibits no discharge. Left eye exhibits no discharge. No scleral icterus.  Neck: Normal range of motion. Neck supple. No JVD present. No tracheal deviation present. No thyromegaly  present.  Cardiovascular: Normal rate, regular rhythm, normal heart sounds and intact distal pulses.  Exam reveals no gallop and no friction rub.   No murmur heard. Pulmonary/Chest: Effort normal and breath sounds normal. No stridor. No respiratory distress. He has no wheezes. He has no rales. He exhibits no tenderness.  Abdominal: Soft. Bowel sounds are normal. He exhibits mass (hernia is better). He exhibits no distension. There is no tenderness. There is no rebound and no guarding.  Genitourinary: Rectum normal and penis normal. Guaiac negative stool. No penile tenderness.       R>L lobe prostate. No nodules. 1+. NT  Musculoskeletal: Normal range of motion. He exhibits no edema and no tenderness.  Lymphadenopathy:    He has no cervical adenopathy.  Neurological: He is alert and oriented to person, place, and time. He has normal reflexes. No cranial nerve deficit. He exhibits normal muscle tone. Coordination normal.  Skin: Skin is warm and dry. No rash noted. He is not diaphoretic. No erythema. No pallor.  Psychiatric: He has a normal mood and affect. His behavior is normal. Judgment and thought content normal.   Scalp AKs  Lab Results  Component Value Date   WBC 8.3 09/02/2012   HGB 15.4 09/02/2012   HCT 46.7 09/02/2012   PLT 295.0 09/02/2012   CHOL 195 09/02/2012   TRIG 87.0 09/02/2012   HDL 43.20 09/02/2012   LDLDIRECT 151.8 05/15/2010   ALT 15 09/02/2012   AST 20 09/02/2012   NA 139 09/02/2012   K 4.5 09/02/2012   CL 100 09/02/2012   CREATININE 0.9 09/02/2012   BUN 12 09/02/2012   CO2 34* 09/02/2012  TSH 1.57 09/02/2012   PSA 1.74 09/02/2012   INR 0.93 08/12/2010        Assessment & Plan:

## 2012-09-09 NOTE — Assessment & Plan Note (Signed)
Discussed Wt loss

## 2012-09-09 NOTE — Assessment & Plan Note (Signed)
Recheck PSA 

## 2012-11-05 ENCOUNTER — Other Ambulatory Visit: Payer: Self-pay | Admitting: Internal Medicine

## 2012-11-10 NOTE — Telephone Encounter (Signed)
Please advise on Rf 

## 2012-12-21 ENCOUNTER — Encounter: Payer: Self-pay | Admitting: Internal Medicine

## 2012-12-21 ENCOUNTER — Ambulatory Visit (INDEPENDENT_AMBULATORY_CARE_PROVIDER_SITE_OTHER): Payer: BC Managed Care – PPO | Admitting: Internal Medicine

## 2012-12-21 VITALS — BP 140/90 | HR 76 | Temp 97.9°F | Resp 16 | Wt 250.0 lb

## 2012-12-21 DIAGNOSIS — R5381 Other malaise: Secondary | ICD-10-CM

## 2012-12-21 DIAGNOSIS — R5383 Other fatigue: Secondary | ICD-10-CM | POA: Insufficient documentation

## 2012-12-21 DIAGNOSIS — I10 Essential (primary) hypertension: Secondary | ICD-10-CM

## 2012-12-21 DIAGNOSIS — J189 Pneumonia, unspecified organism: Secondary | ICD-10-CM

## 2012-12-21 DIAGNOSIS — R682 Dry mouth, unspecified: Secondary | ICD-10-CM

## 2012-12-21 DIAGNOSIS — L570 Actinic keratosis: Secondary | ICD-10-CM

## 2012-12-21 DIAGNOSIS — K117 Disturbances of salivary secretion: Secondary | ICD-10-CM

## 2012-12-21 MED ORDER — AMOXICILLIN-POT CLAVULANATE 875-125 MG PO TABS: 1.0000 | ORAL_TABLET | Freq: Two times a day (BID) | ORAL | Status: DC

## 2012-12-21 NOTE — Progress Notes (Signed)
Subjective:    HPI  F/u pneumonia - 2 wks ago at [facility] -- pneumonia on CXR-- he took Abx and Prednisone and had a f/u visit... F/u HTN, GERD and anxiety C/o dry mouth and fatigue   Review of Systems  Constitutional: Negative for fever, appetite change, fatigue and unexpected weight change.  HENT: Negative for nosebleeds, congestion, sore throat, sneezing, trouble swallowing and neck pain.   Eyes: Negative for itching and visual disturbance.  Respiratory: Positive for shortness of breath. Negative for cough.   Cardiovascular: Negative for chest pain, palpitations and leg swelling.  Gastrointestinal: Negative for nausea, diarrhea, blood in stool, abdominal distention and anal bleeding.  Genitourinary: Negative for frequency and hematuria.  Musculoskeletal: Negative for back pain, joint swelling and gait problem.  Skin: Negative for rash.  Neurological: Negative for dizziness, tremors, speech difficulty and weakness.  Psychiatric/Behavioral: Negative for suicidal ideas, sleep disturbance, dysphoric mood and agitation. The patient is not nervous/anxious and is not hyperactive.    Wt Readings from Last 3 Encounters:  12/21/12 250 lb (113.399 kg)  09/09/12 243 lb (110.224 kg)  06/25/11 249 lb (112.946 kg)   BP Readings from Last 3 Encounters:  12/21/12 140/90  09/09/12 130/76  06/25/11 128/82        Objective:   Physical Exam  Constitutional: He is oriented to person, place, and time. He appears well-developed. No distress.  Obese  HENT:  Head: Normocephalic and atraumatic.  Right Ear: External ear normal.  Left Ear: External ear normal.  Nose: Nose normal.  Mouth/Throat: Oropharynx is clear and moist. No oropharyngeal exudate.  Eyes: Conjunctivae and EOM are normal. Pupils are equal, round, and reactive to light. Right eye exhibits no discharge. Left eye exhibits no discharge. No scleral icterus.  Neck: Normal range of motion. Neck supple. No JVD present. No tracheal  deviation present. No thyromegaly present.  Cardiovascular: Normal rate, regular rhythm, normal heart sounds and intact distal pulses.  Exam reveals no gallop and no friction rub.   No murmur heard. Pulmonary/Chest: Effort normal and breath sounds normal. No stridor. No respiratory distress. He has no wheezes. He has no rales. He exhibits no tenderness.  Abdominal: Soft. Bowel sounds are normal. He exhibits mass (hernia is better). He exhibits no distension. There is no tenderness. There is no rebound and no guarding.  Genitourinary: Rectum normal and penis normal. Guaiac negative stool. No penile tenderness.  R>L lobe prostate. No nodules. 1+. NT  Musculoskeletal: Normal range of motion. He exhibits no edema and no tenderness.  Lymphadenopathy:    He has no cervical adenopathy.  Neurological: He is alert and oriented to person, place, and time. He has normal reflexes. No cranial nerve deficit. He exhibits normal muscle tone. Coordination normal.  Skin: Skin is warm and dry. No rash noted. He is not diaphoretic. No erythema. No pallor.  Psychiatric: He has a normal mood and affect. His behavior is normal. Judgment and thought content normal.   Scalp AKs  Lab Results  Component Value Date   WBC 8.3 09/02/2012   HGB 15.4 09/02/2012   HCT 46.7 09/02/2012   PLT 295.0 09/02/2012   CHOL 195 09/02/2012   TRIG 87.0 09/02/2012   HDL 43.20 09/02/2012   LDLDIRECT 151.8 05/15/2010   ALT 15 09/02/2012   AST 20 09/02/2012   NA 139 09/02/2012   K 4.5 09/02/2012   CL 100 09/02/2012   CREATININE 0.9 09/02/2012   BUN 12 09/02/2012   CO2 34* 09/02/2012  TSH 1.57 09/02/2012   PSA 1.74 09/02/2012   INR 0.93 08/12/2010        Assessment & Plan:

## 2012-12-21 NOTE — Assessment & Plan Note (Signed)
Treated; recovering

## 2012-12-21 NOTE — Assessment & Plan Note (Signed)
Continue with current prescription therapy as reflected on the Med list.  

## 2012-12-21 NOTE — Assessment & Plan Note (Signed)
2/14 post-pneumonia discussed

## 2012-12-21 NOTE — Assessment & Plan Note (Signed)
See procedure 

## 2012-12-21 NOTE — Assessment & Plan Note (Signed)
cbg  

## 2013-03-17 ENCOUNTER — Other Ambulatory Visit: Payer: Self-pay | Admitting: Internal Medicine

## 2013-09-18 ENCOUNTER — Other Ambulatory Visit: Payer: Self-pay | Admitting: Internal Medicine

## 2013-10-04 ENCOUNTER — Ambulatory Visit (INDEPENDENT_AMBULATORY_CARE_PROVIDER_SITE_OTHER): Payer: BC Managed Care – PPO | Admitting: Nurse Practitioner

## 2013-10-04 ENCOUNTER — Encounter: Payer: Self-pay | Admitting: Nurse Practitioner

## 2013-10-04 VITALS — BP 140/80 | HR 61 | Temp 98.2°F | Ht 72.0 in | Wt 257.5 lb

## 2013-10-04 DIAGNOSIS — R8290 Unspecified abnormal findings in urine: Secondary | ICD-10-CM

## 2013-10-04 DIAGNOSIS — N10 Acute tubulo-interstitial nephritis: Secondary | ICD-10-CM

## 2013-10-04 DIAGNOSIS — R82998 Other abnormal findings in urine: Secondary | ICD-10-CM

## 2013-10-04 LAB — POCT URINALYSIS DIPSTICK
Bilirubin, UA: NEGATIVE
Glucose, UA: NEGATIVE
Nitrite, UA: POSITIVE
Urobilinogen, UA: NEGATIVE

## 2013-10-04 MED ORDER — CIPROFLOXACIN HCL 500 MG PO TABS: 500.0000 mg | ORAL_TABLET | Freq: Two times a day (BID) | ORAL | Status: DC

## 2013-10-04 MED ORDER — CEFTRIAXONE SODIUM 1 G IJ SOLR
1.0000 g | Freq: Once | INTRAMUSCULAR | Status: AC
  Administered 2013-10-04: 1 g via INTRAMUSCULAR

## 2013-10-04 NOTE — Progress Notes (Signed)
   Subjective:    Patient ID: Jeffrey [name], male    DOB: 04-01-1951, 62 y.o.   MRN: [id number]  Back Pain This is a new (pt reports last time he had back pain of this nature, he was treated for pneumonia) problem. The current episode started in the past 7 days (4da). The problem occurs constantly (pt states didn't feel well 4 da, then 3 da developed body aches & chills, yesterday developed back pain described as severe. Also noticed malodorous urine). The problem has been gradually worsening since onset. The pain is present in the thoracic spine. The quality of the pain is described as aching. The pain does not radiate. The pain is moderate. The pain is the same all the time. Exacerbated by: nothing. Stiffness is present all day. Pertinent negatives include no abdominal pain, chest pain, dysuria, fever, headaches, numbness, pelvic pain, tingling or weakness. (Frequency & malodorous urine) Risk factors: asplenia. Treatments tried: hydrocodone, and "some antibiotics he had at home" The treatment provided moderate relief.      Review of Systems  Constitutional: Positive for chills. Negative for fever and activity change.  HENT: Negative for congestion and sore throat.   Respiratory: Negative for cough, chest tightness, shortness of breath and wheezing.   Cardiovascular: Negative for chest pain.  Gastrointestinal: Negative for nausea, vomiting, abdominal pain and diarrhea.  Genitourinary: Positive for frequency and flank pain (bilateral). Negative for dysuria and pelvic pain.       Malodorous urine  Musculoskeletal: Positive for back pain and myalgias.  Neurological: Negative for tingling, weakness, numbness and headaches.       Objective:   Physical Exam  Vitals reviewed. Constitutional: He is oriented to person, place, and time. He appears well-developed and well-nourished. No distress.  HENT:  Head: Normocephalic and atraumatic.  Right Ear: External ear normal.  Left Ear: External ear  normal.  Mouth/Throat: Oropharynx is clear and moist. No oropharyngeal exudate.  Eyes: Conjunctivae are normal. Right eye exhibits no discharge. Left eye exhibits no discharge.  L eye mildly injected  Neck: Normal range of motion. Neck supple. No thyromegaly present.  Cardiovascular: Normal rate, regular rhythm and normal heart sounds.   No murmur heard. Pulmonary/Chest: Effort normal and breath sounds normal. No respiratory distress. He has no wheezes.  Abdominal: He exhibits distension. He exhibits no mass. There is no tenderness. There is no rebound and no guarding.  Musculoskeletal: He exhibits no tenderness (no cva tenderness).  Lymphadenopathy:    He has no cervical adenopathy.  Neurological: He is alert and oriented to person, place, and time.  Skin: Skin is warm and dry.  Psychiatric: He has a normal mood and affect. His behavior is normal. Thought content normal.          Assessment & Plan:  1.Acute pyelonephritis - POCT urinalysis dipstick- mod leuks, + nites, lg blood - Urine Culture; Future - Urinalysis, Routine w reflex microscopic; Future - cefTRIAXone (ROCEPHIN) injection 1 g; Inject 1 g into the muscle once. - ciprofloxacin (CIPRO) 500 MG tablet; Take 1 tablet (500 mg total) by mouth 2 (two) times daily.  Dispense: 10 tablet; Refill: 0 See pt instructions.  2 Asplenia

## 2013-10-04 NOTE — Patient Instructions (Signed)
You have a kidney infection. Please sip fluids every hour. Start oral antibiotic tomorrow morning. Eat yogurt daily to help prevent diarrhea. We need to see you in 2 days. Avoid ibuprophen for the next 2 weeks. Take tylenol for pain.  Pyelonephritis, Adult Pyelonephritis is a kidney infection. In general, there are 2 main types of pyelonephritis:  Infections that come on quickly without any warning (acute pyelonephritis).  Infections that persist for a long period of time (chronic pyelonephritis). CAUSES  Two main causes of pyelonephritis are:  Bacteria traveling from the bladder to the kidney. This is a problem especially in pregnant women. The urine in the bladder can become filled with bacteria from multiple causes, including:  Inflammation of the prostate gland (prostatitis).  Sexual intercourse in females.  Bladder infection (cystitis).  Bacteria traveling from the bloodstream to the tissue part of the kidney. Problems that may increase your risk of getting a kidney infection include:  Diabetes.  Kidney stones or bladder stones.  Cancer.  Catheters placed in the bladder.  Other abnormalities of the kidney or ureter. SYMPTOMS   Abdominal pain.  Pain in the side or flank area.  Fever.  Chills.  Upset stomach.  Blood in the urine (dark urine).  Frequent urination.  Strong or persistent urge to urinate.  Burning or stinging when urinating. DIAGNOSIS  Your caregiver may diagnose your kidney infection based on your symptoms. A urine sample may also be taken. TREATMENT  In general, treatment depends on how severe the infection is.   If the infection is mild and caught early, your caregiver may treat you with oral antibiotics and send you home.  If the infection is more severe, the bacteria may have gotten into the bloodstream. This will require intravenous (IV) antibiotics and a hospital stay. Symptoms may include:  High fever.  Severe flank pain.  Shaking  chills.  Even after a hospital stay, your caregiver may require you to be on oral antibiotics for a period of time.  Other treatments may be required depending upon the cause of the infection. HOME CARE INSTRUCTIONS   Take your antibiotics as directed. Finish them even if you start to feel better.  Make an appointment to have your urine checked to make sure the infection is gone.  Drink enough fluids to keep your urine clear or pale yellow.  Take medicines for the bladder if you have urgency and frequency of urination as directed by your caregiver. SEEK IMMEDIATE MEDICAL CARE IF:   You have a fever or persistent symptoms for more than 2-3 days.  You have a fever and your symptoms suddenly get worse.  You are unable to take your antibiotics or fluids.  You develop shaking chills.  You experience extreme weakness or fainting.  There is no improvement after 2 days of treatment. MAKE SURE YOU:  Understand these instructions.  Will watch your condition.  Will get help right away if you are not doing well or get worse. Document Released: 10/12/2005 Document Revised: 04/12/2012 Document Reviewed: 03/18/2011 [facility] Patient Information 2014 [place], [place].

## 2013-10-04 NOTE — Progress Notes (Signed)
Pre-visit discussion using our clinic review tool. No additional management support is needed unless otherwise documented below in the visit note.  

## 2013-10-05 ENCOUNTER — Other Ambulatory Visit: Payer: Self-pay | Admitting: Internal Medicine

## 2013-10-05 LAB — URINALYSIS, ROUTINE W REFLEX MICROSCOPIC
Bilirubin Urine: NEGATIVE
Glucose, UA: NEGATIVE mg/dL
Ketones, ur: NEGATIVE mg/dL
Protein, ur: NEGATIVE mg/dL
pH: 6.5 (ref 5.0–8.0)

## 2013-10-05 LAB — URINALYSIS, MICROSCOPIC ONLY
Casts: NONE SEEN
Squamous Epithelial / LPF: NONE SEEN

## 2013-10-06 ENCOUNTER — Encounter: Payer: Self-pay | Admitting: Nurse Practitioner

## 2013-10-06 ENCOUNTER — Ambulatory Visit (INDEPENDENT_AMBULATORY_CARE_PROVIDER_SITE_OTHER): Payer: BC Managed Care – PPO | Admitting: Nurse Practitioner

## 2013-10-06 VITALS — BP 148/82 | HR 65 | Temp 98.5°F | Wt 255.0 lb

## 2013-10-06 DIAGNOSIS — N10 Acute tubulo-interstitial nephritis: Secondary | ICD-10-CM

## 2013-10-06 NOTE — Patient Instructions (Signed)
Continue antibiotic & flomax. Likely you developed the urinary tract infection because flomax was stopped which led to enlarged prostate and incomplete bladder emptying. Consider seeing urology again to discuss sequela of not using flomax. Continue to sip fluids hourly to flush kidneys & bladder.  Benign Prostatic Hypertrophy  The prostate gland is part of the reproductive system of men. A normal prostate is about the size and shape of a walnut. The prostate gland makes a fluid that is mixed with sperm to make semen. This gland surrounds the urethra and is located in front of the rectum and just below the bladder. The bladder is where urine is stored. The urethra is the tube through which urine passes from the bladder to get out of the body. The prostate grows as a man ages. An enlarged prostate not caused by cancer is called benign prostatic hypertrophy (BPH). This is a common health problem in men over age 30. This condition is a normal part of aging. An enlarged prostate presses on the urethra. This makes it harder to pass urine. In the early stages of enlargement, the bladder can get by with a narrowed urethra by forcing the urine through. If the problem gets worse, medical or surgical treatment may be required.  This condition should be followed by your caregiver. Longstanding back pressure on the kidneys can cause infection. Back pressure and infection can progress to bladder damage and kidney (renal) failure. If needed, your caregiver may refer you to a specialist in kidney and prostate disease (urologist). CAUSES  The exact cause is not known.  SYMPTOMS   You are not able to completely empty your bladder.  Getting up often during the night to urinate.  Need to urinate frequently during the day.  Difficultly in starting urine flow.  Decrease in size and strength of the urine stream.  Dribbling after urination.  Pain on urination (more common with infection).  Inability to pass your  water. This needs immediate treatment. DIAGNOSIS  These tests will help your caregiver understand your problem:  Digital rectal exam (DRE). In a rectal exam, your caregiver checks your prostate by putting a gloved, lubricated finger into the rectum to feel the back of your prostate gland. This exam detects the size of the gland and abnormal lumps or growths.  Urinalysis (exam of the urine). This may include a culture if there is concern about infection.  Prostate Specific Antigen (PSA). This is a blood test used to screen for prostate cancer. It is not used alone for diagnosing prostate cancer.  Rectal ultrasound (sonogram). This test uses sound waves to electronically produce a "picture" of the prostate. It helps examine the prostate gland for cancer. TREATMENT  Mild symptoms may not need treatment. Simple observation and yearly exams may be all that is required. Medications and surgery are options for more severe problems. Your caregiver can help you make an informed decision for what is best. Two classes of medications are available for relief of prostate symptoms:  Medications that shrink the prostate. This helps relieve symptoms.  Uncommon side effects include problems with sexual function.  Medications to relax the muscle of the prostate. This also relieves the obstruction.  Side effects can include dizziness, fatigue, lightheadedness, and retrograde ejaculation (diminished volume of ejaculate). Several types of surgical treatments are available for relief of prostate symptoms:  Transurethral resection of the prostate (TURP). In this treatment, an instrument is inserted through opening at the tip of the penis. It is used to cut  away pieces of the inner core of the prostate. The pieces are removed through the same opening of the penis. This removes the obstruction and helps get rid of the symptoms.  Transurethral incision (TUIP). In this procedure, small cuts are made in the prostate.  This lessens the prostates pressure on the urethra.  Transurethral microwave thermotherapy (TUMT). This procedure uses microwaves to create heat. The heat destroys and removes a small amount of prostate tissue.  Transurethral needle ablation (TUNA). This is a procedure that uses radio frequencies to do the same as TUMT.  Interstitial laser coagulation (ILC). This is a procedure that uses a laser to do the same as TUMT and TUNA.  Transurethral electrovaporization (TUVP). This is a procedure that uses electrodes to do the same as the procedures listed above. Regardless of the method of treatment chosen, you and your caregiver will discuss the options. With this knowledge, you along with your caregiver can decide upon the best treatment for you. SEEK MEDICAL CARE IF:   You develop chills, fever of 100.5 F (38.1 C), or night sweats.  There is unexplained back pain.  Symptoms are not helped by medications prescribed.  You develop medication side effects.  Your urine becomes very dark or has a bad smell. SEEK IMMEDIATE MEDICAL CARE IF:   You are suddenly unable to urinate. This is an emergency. You should be seen immediately.  There are large amounts of blood or clots in the urine.  Your urinary problems become unmanageable.  You develop lightheadedness, severe dizziness, or you feel faint.  You develop moderate to severe low back or flank pain.  You develop chills or fever. Document Released: 10/12/2005 Document Revised: 01/04/2012 Document Reviewed: 04/27/2013 [facility] Patient Information 2014 [place], [place].

## 2013-10-06 NOTE — Progress Notes (Signed)
Subjective:    Jeffrey [name] is a 62 y.o. male who is here for follow up of pyelonephritis. He was seen in office 2 days ago with body aches, back pain, & malodorous urine. UA was pos for leuks & blood. He was given IM rocephin in ofc & started on oral cipro.Increased risk was thought to be r/t asplenia. Pt feels much improved today, reporting less back pain and no body aches. He also states he started back on flomax. He had weaned off the medicne per urologists intructions. He has noticed frequency developing over time. He re-started the medicine yesterday and is no longer having frequency.  The following portions of the patient's history were reviewed and updated as appropriate: allergies, current medications, past medical history, past social history and problem list.  Review of Systems Constitutional: negative for fatigue, fevers and night sweats Genitourinary:pos hX for BPH treated by urology with flomax. Weaned off med several mos ago as PSA had dropped from 5 to 1. Frequency has resolved since starting back on flomax and starting ABX 2 da. Musculoskeletal:positive for chronic low back pain    Objective:    BP 148/82  Pulse 65  Temp(Src) 98.5 F (36.9 C) (Oral)  Wt 255 lb (115.667 kg)  SpO2 95% General appearance: alert, cooperative, appears stated age and no distress Head: Normocephalic, without obvious abnormality, atraumatic Eyes: lids & lashes clear, sclera still mildlt injected, bilat Back: symmetric, no curvature. ROM normal. No CVA tenderness., pt has kyphosis, obese abdomen Lungs: clear to auscultation bilaterally Heart: regular rate and rhythm, S1, S2 normal, no murmur, click, rub or gallop  Laboratory:  Urine culture- pos for e.coli. Treating w/cipro. Will monitor for sensitivity report.  Assessment & Plan:     1 Pyelonephritis, pt is improved after 1 inj rocephin & 2 days cipro. Will continue cipro for 3 more days.  Likely caused by BPH- pt resumed flomax. Urinary  frequency resolved within few hours. Recmd see urology again, as he was instructed to stop flomax due to improvement in PSA.  See pt instructions.

## 2013-10-06 NOTE — Progress Notes (Signed)
Pre-visit discussion using our clinic review tool. No additional management support is needed unless otherwise documented below in the visit note.  

## 2013-10-07 LAB — URINE CULTURE: Colony Count: >=100000

## 2013-11-11 ENCOUNTER — Ambulatory Visit: Payer: BC Managed Care – PPO | Admitting: Family Medicine

## 2013-11-13 ENCOUNTER — Ambulatory Visit (INDEPENDENT_AMBULATORY_CARE_PROVIDER_SITE_OTHER): Payer: BC Managed Care – PPO | Admitting: Family Medicine

## 2013-11-13 ENCOUNTER — Encounter: Payer: Self-pay | Admitting: Family Medicine

## 2013-11-13 ENCOUNTER — Other Ambulatory Visit (INDEPENDENT_AMBULATORY_CARE_PROVIDER_SITE_OTHER): Payer: BC Managed Care – PPO

## 2013-11-13 VITALS — BP 138/76 | HR 90 | Temp 97.5°F | Resp 18

## 2013-11-13 DIAGNOSIS — G8929 Other chronic pain: Secondary | ICD-10-CM | POA: Insufficient documentation

## 2013-11-13 DIAGNOSIS — M25561 Pain in right knee: Secondary | ICD-10-CM

## 2013-11-13 DIAGNOSIS — M25569 Pain in unspecified knee: Secondary | ICD-10-CM

## 2013-11-13 MED ORDER — MELOXICAM 15 MG PO TABS: 15.0000 mg | ORAL_TABLET | Freq: Every day | ORAL | Status: DC

## 2013-11-13 NOTE — Progress Notes (Signed)
I'm seeing this patient by the request  of:    CC: acute right knee pain.   HPI: Patient is a very pleasant 63 year old gentleman who was getting out of tractor and twisted his leg and had a large pop. Since that time he has been unable to bear weight on his knee. Patient is never injured his right knee previously. Patient is unable to bear weight still and is walking with crutches. Patient denies any swelling, denies any radiation of pain, denies any numbness or tingling. Patient states that if he rotates his leg taken a severe pain that is 9/10 in severity. Patient has not tried anything other than for crutches which is the only thing that seems to help. Did have some mild discomfort at night as well. :  Past medical, surgical, family and social history reviewed. Medications reviewed all in the electronic medical record.   Review of Systems: No headache, visual changes, nausea, vomiting, diarrhea, constipation, dizziness, abdominal pain, skin rash, fevers, chills, night sweats, weight loss, swollen lymph nodes, body aches, joint swelling, muscle aches, chest pain, shortness of breath, mood changes.   Objective:    Blood pressure 138/76, pulse 90, temperature 97.5 F (36.4 C), temperature source Oral, resp. rate 18, SpO2 96.00%.   General: No apparent distress alert and oriented x3 mood and affect normal, dressed appropriately.  HEENT: Pupils equal, extraocular movements intact Respiratory: Patient's speak in full sentences and does not appear short of breath Cardiovascular: No lower extremity edema, non tender, no erythema Skin: Warm dry intact with no signs of infection or rash on extremities or on axial skeleton. Abdomen: Soft nontender Neuro: Cranial nerves II through XII are intact, neurovascularly intact in all extremities with 2+ DTRs and 2+ pulses. Lymph: No lymphadenopathy of posterior or anterior cervical chain or axillae bilaterally.  Gait normal with good balance and  coordination.  MSK: Non tender with full range of motion and good stability and symmetric strength and tone of shoulders, elbows, wrist, and ankles bilaterally.  Knee: Right Normal to inspection with no erythema or effusion or obvious bony abnormalities. Palpation normal with no warmth, minimal medial and lateral joint line tenderness, no patellar tenderness, or condyle tenderness. ROM full in flexion and extension and lower leg rotation. Ligaments with solid consistent endpoints including ACL, PCL, LCL, MCL. Positive Mcmurray's, Apley's, and Thessalonian tests. Non painful patellar compression. Patellar glide without crepitus. Patellar and quadriceps tendons unremarkable. Hamstring and quadriceps strength is normal.  Contralateral knee moderate grinding noted a full range of motion and full strength in neurovascularly intact distally.  MSK US performed of: Right knee This study was ordered, performed, and interpreted by [name] D.O.  Knee: All structures visualized. Anteromedial, anterolateral, and posterolateral menisci unremarkable without tearing, fraying, effusion, or displacement. Posterior medial meniscal. Additional displacement as well as the potential tear. There is increased upper flow in this area with hypoechoic changes. Patient's lateral meniscus was normal the patient does have significant swelling or hypoechoic changes surrounding the popliteus muscle. Patellar Tendon unremarkable on long and transverse views without effusion. No abnormality of prepatellar bursa. LCL and MCL unremarkable on long and transverse views. No abnormality of origin of medial or lateral head of the gastrocnemius.  IMPRESSION:  Right medial meniscal tear with questionable popliteus subluxation   After informed written and verbal consent, patient was seated on exam table. Right knee was prepped with alcohol swab and utilizing anterolateral approach, patient's right knee space was injected with  4:1  marcaine 0.5%: Kenalog  40mg /dL. Patient tolerated the procedure well without immediate complications.  Impression and Recommendations:     This case required medical decision making of moderate complexity.

## 2013-11-13 NOTE — Patient Instructions (Signed)
Very nice to meet you.  Try the brace Ice 20 minutes 2 times daily.  Exercises most days of the week starting in 2 days.  Meloxicam daily for 10 days then as needed Come back and see me again in 10 days.

## 2013-11-13 NOTE — Assessment & Plan Note (Signed)
Patient's history and physical was concerning for possible meniscal tear. Ultrasound did show a medial meniscal tear. Patient given an injection today and did have some pain relief but still has some discomfort with weightbearing. Patient will try conservative therapy with anti-inflammatories, bracing which patient was given today in fitted by me, as well as home exercise program. Patient will return again in 2-4 weeks for further evaluation. Patient continues to have mechanical symptoms I would like to consider further imaging including x-rays of potential MRI.  Differential also includes the popliteus subluxation over the patient will improve slowly but patient did respond well to the injection today which is optimistic .

## 2013-11-22 ENCOUNTER — Ambulatory Visit (INDEPENDENT_AMBULATORY_CARE_PROVIDER_SITE_OTHER): Payer: BC Managed Care – PPO | Admitting: Family Medicine

## 2013-11-22 ENCOUNTER — Encounter: Payer: Self-pay | Admitting: Family Medicine

## 2013-11-22 VITALS — BP 138/76 | HR 58 | Temp 98.5°F | Resp 16 | Wt 247.8 lb

## 2013-11-22 DIAGNOSIS — M25561 Pain in right knee: Secondary | ICD-10-CM

## 2013-11-22 DIAGNOSIS — M25569 Pain in unspecified knee: Secondary | ICD-10-CM

## 2013-11-22 NOTE — Progress Notes (Signed)
Pre-visit discussion using our clinic review tool. No additional management support is needed unless otherwise documented below in the visit note.  

## 2013-11-22 NOTE — Progress Notes (Signed)
   CC: Right knee pain followup  HPI: Patient is coming back for followup of his right knee pain. Patient did have a meniscal tear and was treated with a steroid injection as well as home exercises and bracing. Patient states that he is partially 95% better. Patient is able to do all activities of daily living without any discomfort and denies any swelling, any radiation of pain, any numbness or any mechanical symptoms. Patient is very happy with the results at this time. Patient does want to discuss other new problems but then again he states that he is ready nighttime. Patient did bring up right box pain and left shoulder pain but did not want to go into detail :  Past medical, surgical, family and social history reviewed. Medications reviewed all in the electronic medical record.   Review of Systems: No headache, visual changes, nausea, vomiting, diarrhea, constipation, dizziness, abdominal pain, skin rash, fevers, chills, night sweats, weight loss, swollen lymph nodes, body aches, joint swelling, muscle aches, chest pain, shortness of breath, mood changes.   Objective:    Blood pressure 138/76, pulse 58, temperature 98.5 F (36.9 C), temperature source Oral, resp. rate 16, weight 247 lb 12.8 oz (112.401 kg), SpO2 96.00%.   General: No apparent distress alert and oriented x3 mood and affect normal, dressed appropriately.  HEENT: Pupils equal, extraocular movements intact Respiratory: Patient's speak in full sentences and does not appear short of breath Cardiovascular: No lower extremity edema, non tender, no erythema Skin: Warm dry intact with no signs of infection or rash on extremities or on axial skeleton. Abdomen: Soft nontender Neuro: Cranial nerves II through XII are intact, neurovascularly intact in all extremities with 2+ DTRs and 2+ pulses. Lymph: No lymphadenopathy of posterior or anterior cervical chain or axillae bilaterally.  Gait normal with good balance and coordination.   MSK: Non tender with full range of motion and good stability and symmetric strength and tone of shoulders, elbows, wrist, and ankles bilaterally.  Knee: Right Normal to inspection with no erythema or effusion or obvious bony abnormalities. Palpation normal with no warmth, no medial and lateral joint line tenderness, no patellar tenderness, or condyle tenderness. ROM full in flexion and extension and lower leg rotation. Ligaments with solid consistent endpoints including ACL, PCL, LCL, MCL. Negative Mcmurray's, Apley's, and Thessalonian tests. Non painful patellar compression. Patellar glide without crepitus. Patellar and quadriceps tendons unremarkable. Hamstring and quadriceps strength is normal.  Contralateral knee moderate grinding noted a full range of motion and full strength in neurovascularly intact distally.   Impression and Recommendations:     This case required medical decision making of moderate complexity.

## 2013-11-22 NOTE — Assessment & Plan Note (Signed)
Patient has improved significantly at this time Patient encouraged to continue the exercises 3 times a week for the next 6 weeks. Discussed wearing a brace with increasing activity the patient is able to start walking again on a regular basis. Patient will come back and see me again in 6 weeks for further followup. Patient was given a handout for questionable piriformis syndrome as well shoulder bursitis today but we did not go into greater detail. Spent greater than 25 minutes with patient face-to-face and had greater than 50% of counseling including as described above in assessment and plan.

## 2013-11-22 NOTE — Patient Instructions (Signed)
Great to see you Continue exercises 3 times a week for next 6 weeks.  For the piriformis wear a tennis ball in back right pocket with sitting.  Wear brace when on treadmill and long walking.  Ice 20 minutes after activity.  Giving you exercises for hip and shoulder to have potentially do 2 times a week.  Fish oil 2 grams daily Tylenol 650 mg three times a day best medicine for arthritis.  Tumeric 500mg  two times day Glucosamine 750 mg twice daily can help as well.  Come back again in 6 weeks.

## 2013-12-06 ENCOUNTER — Other Ambulatory Visit (INDEPENDENT_AMBULATORY_CARE_PROVIDER_SITE_OTHER): Payer: BC Managed Care – PPO

## 2013-12-06 ENCOUNTER — Ambulatory Visit (INDEPENDENT_AMBULATORY_CARE_PROVIDER_SITE_OTHER): Payer: BC Managed Care – PPO | Admitting: Internal Medicine

## 2013-12-06 ENCOUNTER — Encounter: Payer: Self-pay | Admitting: Internal Medicine

## 2013-12-06 VITALS — BP 160/98 | HR 76 | Temp 98.5°F | Resp 16

## 2013-12-06 DIAGNOSIS — Z23 Encounter for immunization: Secondary | ICD-10-CM

## 2013-12-06 DIAGNOSIS — R972 Elevated prostate specific antigen [PSA]: Secondary | ICD-10-CM

## 2013-12-06 DIAGNOSIS — Z9081 Acquired absence of spleen: Secondary | ICD-10-CM

## 2013-12-06 DIAGNOSIS — Z0000 Encounter for general adult medical examination without abnormal findings: Secondary | ICD-10-CM

## 2013-12-06 DIAGNOSIS — K219 Gastro-esophageal reflux disease without esophagitis: Secondary | ICD-10-CM

## 2013-12-06 DIAGNOSIS — Z9089 Acquired absence of other organs: Secondary | ICD-10-CM

## 2013-12-06 DIAGNOSIS — I10 Essential (primary) hypertension: Secondary | ICD-10-CM

## 2013-12-06 LAB — CBC WITH DIFFERENTIAL/PLATELET
BASOS PCT: 0.3 % (ref 0.0–3.0)
Basophils Absolute: 0 10*3/uL (ref 0.0–0.1)
EOS PCT: 1 % (ref 0.0–5.0)
Eosinophils Absolute: 0.1 10*3/uL (ref 0.0–0.7)
HCT: 49.1 % (ref 39.0–52.0)
Hemoglobin: 15.7 g/dL (ref 13.0–17.0)
LYMPHS PCT: 22.6 % (ref 12.0–46.0)
Lymphs Abs: 1.7 10*3/uL (ref 0.7–4.0)
MCHC: 32 g/dL (ref 30.0–36.0)
MCV: 100 fl (ref 78.0–100.0)
Monocytes Absolute: 0.8 10*3/uL (ref 0.1–1.0)
Monocytes Relative: 10.1 % (ref 3.0–12.0)
NEUTROS PCT: 66 % (ref 43.0–77.0)
Neutro Abs: 5 10*3/uL (ref 1.4–7.7)
Platelets: 323 10*3/uL (ref 150.0–400.0)
RBC: 4.91 Mil/uL (ref 4.22–5.81)
RDW: 14.6 % (ref 11.5–14.6)
WBC: 7.6 10*3/uL (ref 4.5–10.5)

## 2013-12-06 LAB — URINALYSIS
Bilirubin Urine: NEGATIVE
HGB URINE DIPSTICK: NEGATIVE
Ketones, ur: NEGATIVE
Leukocytes, UA: NEGATIVE
Nitrite: NEGATIVE
SPECIFIC GRAVITY, URINE: 1.015 (ref 1.000–1.030)
Total Protein, Urine: NEGATIVE
URINE GLUCOSE: NEGATIVE
UROBILINOGEN UA: 0.2 (ref 0.0–1.0)
pH: 7.5 (ref 5.0–8.0)

## 2013-12-06 LAB — BASIC METABOLIC PANEL
BUN: 13 mg/dL (ref 6–23)
CALCIUM: 9.8 mg/dL (ref 8.4–10.5)
CO2: 34 mEq/L — ABNORMAL HIGH (ref 19–32)
Chloride: 101 mEq/L (ref 96–112)
Creatinine, Ser: 0.9 mg/dL (ref 0.4–1.5)
GFR: 91.5 mL/min (ref >60.00)
GLUCOSE: 91 mg/dL (ref 70–99)
Potassium: 4.7 mEq/L (ref 3.5–5.1)
Sodium: 141 mEq/L (ref 135–145)

## 2013-12-06 LAB — HEPATIC FUNCTION PANEL
ALT: 16 U/L (ref 0–53)
AST: 17 U/L (ref 0–37)
Albumin: 3.7 g/dL (ref 3.5–5.2)
Alkaline Phosphatase: 77 U/L (ref 39–117)
BILIRUBIN DIRECT: 0.1 mg/dL (ref 0.0–0.3)
TOTAL PROTEIN: 7.6 g/dL (ref 6.0–8.3)
Total Bilirubin: 1.1 mg/dL (ref 0.3–1.2)

## 2013-12-06 LAB — TSH: TSH: 2.18 u[IU]/mL (ref 0.35–5.50)

## 2013-12-06 MED ORDER — QUINAPRIL-HYDROCHLOROTHIAZIDE 20-12.5 MG PO TABS: 2.0000 | ORAL_TABLET | Freq: Every day | ORAL | Status: DC

## 2013-12-06 MED ORDER — TAMSULOSIN HCL 0.4 MG PO CAPS: 0.4000 mg | ORAL_CAPSULE | Freq: Every day | ORAL | Status: DC

## 2013-12-06 NOTE — Assessment & Plan Note (Signed)
We discussed age appropriate health related issues, including available/recomended screening tests and vaccinations. We discussed a need for adhering to healthy diet and exercise. Labs/EKG were reviewed/ordered. All questions were answered.   

## 2013-12-06 NOTE — Progress Notes (Signed)
Pre visit review using our clinic review tool, if applicable. No additional management support is needed unless otherwise documented below in the visit note. 

## 2013-12-10 ENCOUNTER — Encounter: Payer: Self-pay | Admitting: Internal Medicine

## 2013-12-10 NOTE — Assessment & Plan Note (Signed)
Check PSA. ?

## 2013-12-10 NOTE — Assessment & Plan Note (Signed)
Vaccinations Abx for fever prn

## 2013-12-10 NOTE — Assessment & Plan Note (Signed)
Continue with current prescription therapy as reflected on the Med list.  

## 2013-12-10 NOTE — Progress Notes (Signed)
Subjective:    HPI  The patient is here for a wellness exam. The patient has been doing well overall without major physical or psychological issues going on lately. F/u HTN, GERD, elev PSA and anxiety    Review of Systems  Constitutional: Negative for fever, appetite change, fatigue and unexpected weight change.  HENT: Negative for congestion, nosebleeds, sneezing, sore throat and trouble swallowing.   Eyes: Negative for itching and visual disturbance.  Respiratory: Positive for shortness of breath. Negative for cough.   Cardiovascular: Negative for chest pain, palpitations and leg swelling.  Gastrointestinal: Negative for nausea, diarrhea, blood in stool, abdominal distention and anal bleeding.  Genitourinary: Negative for frequency and hematuria.  Musculoskeletal: Negative for back pain, gait problem, joint swelling and neck pain.  Skin: Negative for rash.  Neurological: Negative for dizziness, tremors, speech difficulty and weakness.  Psychiatric/Behavioral: Negative for suicidal ideas, sleep disturbance, dysphoric mood and agitation. The patient is not nervous/anxious and is not hyperactive.    Wt Readings from Last 3 Encounters:  11/22/13 247 lb 12.8 oz (112.401 kg)  10/06/13 255 lb (115.667 kg)  10/04/13 257 lb 8 oz (116.801 kg)   BP Readings from Last 3 Encounters:  12/06/13 160/98  11/22/13 138/76  11/13/13 138/76        Objective:   Physical Exam  Constitutional: He is oriented to person, place, and time. He appears well-developed. No distress.  Obese  HENT:  Head: Normocephalic and atraumatic.  Right Ear: External ear normal.  Left Ear: External ear normal.  Nose: Nose normal.  Mouth/Throat: Oropharynx is clear and moist. No oropharyngeal exudate.  Eyes: Conjunctivae and EOM are normal. Pupils are equal, round, and reactive to light. Right eye exhibits no discharge. Left eye exhibits no discharge. No scleral icterus.  Neck: Normal range of motion. Neck  supple. No JVD present. No tracheal deviation present. No thyromegaly present.  Cardiovascular: Normal rate, regular rhythm, normal heart sounds and intact distal pulses.  Exam reveals no gallop and no friction rub.   No murmur heard. Pulmonary/Chest: Effort normal and breath sounds normal. No stridor. No respiratory distress. He has no wheezes. He has no rales. He exhibits no tenderness.  Abdominal: Soft. Bowel sounds are normal. He exhibits mass (hernia is better). He exhibits no distension. There is no tenderness. There is no rebound and no guarding.  Genitourinary: Rectum normal and penis normal. Guaiac negative stool. No penile tenderness.  R>L lobe prostate. No nodules. 1+. NT  Musculoskeletal: Normal range of motion. He exhibits no edema and no tenderness.  Lymphadenopathy:    He has no cervical adenopathy.  Neurological: He is alert and oriented to person, place, and time. He has normal reflexes. No cranial nerve deficit. He exhibits normal muscle tone. Coordination normal.  Skin: Skin is warm and dry. No rash noted. He is not diaphoretic. No erythema. No pallor.  Psychiatric: He has a normal mood and affect. His behavior is normal. Judgment and thought content normal.    Lab Results  Component Value Date   WBC 7.6 12/06/2013   HGB 15.7 12/06/2013   HCT 49.1 12/06/2013   PLT 323.0 12/06/2013   CHOL 195 09/02/2012   TRIG 87.0 09/02/2012   HDL 43.20 09/02/2012   LDLDIRECT 151.8 05/15/2010   ALT 16 12/06/2013   AST 17 12/06/2013   NA 141 12/06/2013   K 4.7 12/06/2013   CL 101 12/06/2013   CREATININE 0.9 12/06/2013   BUN 13 12/06/2013   CO2 34* 12/06/2013  TSH 2.18 12/06/2013   PSA 1.74 09/02/2012   INR 0.93 08/12/2010        Assessment & Plan:

## 2013-12-11 ENCOUNTER — Ambulatory Visit: Payer: BC Managed Care – PPO | Admitting: Family Medicine

## 2013-12-14 ENCOUNTER — Ambulatory Visit (INDEPENDENT_AMBULATORY_CARE_PROVIDER_SITE_OTHER)
Admission: RE | Admit: 2013-12-14 | Discharge: 2013-12-14 | Disposition: A | Discharge status: Home / Self Care | Payer: BC Managed Care – PPO | Source: Ambulatory Visit | Attending: Family Medicine | Admitting: Family Medicine

## 2013-12-14 ENCOUNTER — Ambulatory Visit (INDEPENDENT_AMBULATORY_CARE_PROVIDER_SITE_OTHER): Payer: BC Managed Care – PPO | Admitting: Family Medicine

## 2013-12-14 ENCOUNTER — Encounter: Payer: Self-pay | Admitting: Family Medicine

## 2013-12-14 VITALS — BP 136/78 | HR 77 | Temp 97.3°F | Resp 18 | Wt 247.0 lb

## 2013-12-14 DIAGNOSIS — M25569 Pain in unspecified knee: Secondary | ICD-10-CM

## 2013-12-14 DIAGNOSIS — M25561 Pain in right knee: Secondary | ICD-10-CM

## 2013-12-14 MED ORDER — DICLOFENAC SODIUM 2 % TD SOLN: 2.0000 "application " | Freq: Two times a day (BID) | TRANSDERMAL | Status: DC

## 2013-12-14 NOTE — Patient Instructions (Signed)
Good to see you Start the exercises again 3-4 times a week Ice 20 minutes at the end of the day or after exercises.  Biking or elliptical would be better than treadmill.  Wear brace Try topical medicine 2 times a day Continue the tylenol 650 mg three times a day.  Lets get xrays downstairs today No news is good news  Come back in 3-4 weeks.

## 2013-12-14 NOTE — Progress Notes (Signed)
Pre visit review using our clinic review tool, if applicable. No additional management support is needed unless otherwise documented below in the visit note. 

## 2013-12-14 NOTE — Progress Notes (Signed)
   CC: Right knee pain followup  HPI: Patient is coming back for followup of his right knee pain. Patient did have a meniscal tear and was treated with a steroid injection as well as home exercises and bracing. Patient was seen previously was 95% better. Patient states he started doing significant more activity and has been climbing on and off tractors more frequently. Patient states that he is having more soreness. Patient does state over the course last 3 days it seems to almost resolved on its own again. Patient denies any giving on him or any clicking. Patient has been wearing a brace which has been helpful and has started taking Tylenol. Patient is unable to take meloxicam because it did raise his blood pressure. Has not been doing any icing. States that he needs to sleep with a pillow between his legs does that seems to help the pain.   Past medical, surgical, family and social history reviewed. Medications reviewed all in the electronic medical record.   Review of Systems: No headache, visual changes, nausea, vomiting, diarrhea, constipation, dizziness, abdominal pain, skin rash, fevers, chills, night sweats, weight loss, swollen lymph nodes, body aches, joint swelling, muscle aches, chest pain, shortness of breath, mood changes.   Objective:    Blood pressure 136/78, pulse 77, temperature 97.3 F (36.3 C), temperature source Oral, resp. rate 18, weight 247 lb (112.038 kg), SpO2 98.00%.   General: No apparent distress alert and oriented x3 mood and affect normal, dressed appropriately.  HEENT: Pupils equal, extraocular movements intact Respiratory: Patient's speak in full sentences and does not appear short of breath Cardiovascular: No lower extremity edema, non tender, no erythema Skin: Warm dry intact with no signs of infection or rash on extremities or on axial skeleton. Abdomen: Soft nontender Neuro: Cranial nerves II through XII are intact, neurovascularly intact in all extremities  with 2+ DTRs and 2+ pulses. Lymph: No lymphadenopathy of posterior or anterior cervical chain or axillae bilaterally.  Gait normal with good balance and coordination.  MSK: Non tender with full range of motion and good stability and symmetric strength and tone of shoulders, elbows, wrist, and ankles bilaterally.  Knee: Right Normal to inspection with no erythema or effusion or obvious bony abnormalities. Palpation normal with no warmth, no medial and lateral joint line tenderness, no patellar tenderness, or condyle tenderness. ROM full in flexion and extension and lower leg rotation. Ligaments with solid consistent endpoints including ACL, PCL, LCL, MCL. POSITIVE Mcmurray's, Apley's, and Thessalonian tests. Non painful patellar compression. Patellar glide without crepitus. Patellar and quadriceps tendons unremarkable. Hamstring and quadriceps strength is normal.  Contralateral knee moderate grinding noted a full range of motion and full strength in neurovascularly intact distally.   Impression and Recommendations:     This case required medical decision making of moderate complexity.

## 2013-12-14 NOTE — Assessment & Plan Note (Signed)
Patient does have a meniscal tear overall. He noticed and likely will do decent with conservative therapy. I would like to get x-rays to see how much underlying osteoarthritis is causing some discomfort. Patient is responding to Tylenol and we'll continue this for regular basis. I would also like him to do icing and start his exercises again in 3-4 times a week. In addition all this patient will try topical anti-inflammatories to try to decrease the side effects. We also discussed vitamins that can be beneficial. Patient will come back in 3 weeks for further evaluation. If he continues to have pain I would like to do an ultrasound and likely an injection again.

## 2013-12-18 ENCOUNTER — Telehealth: Payer: Self-pay | Admitting: *Deleted

## 2013-12-18 NOTE — Telephone Encounter (Signed)
PA initiated and faxed to [phone].  Waiting insurance approval.

## 2013-12-20 ENCOUNTER — Telehealth: Payer: Self-pay | Admitting: *Deleted

## 2013-12-20 NOTE — Telephone Encounter (Signed)
PA for Pennsaid approved by [employer] from [id number].  Letter sent to batch scan

## 2014-01-10 ENCOUNTER — Ambulatory Visit: Payer: BC Managed Care – PPO | Admitting: Family Medicine

## 2014-01-17 ENCOUNTER — Ambulatory Visit: Payer: BC Managed Care – PPO | Admitting: Family Medicine

## 2014-02-10 ENCOUNTER — Other Ambulatory Visit: Payer: Self-pay | Admitting: Internal Medicine

## 2014-12-01 ENCOUNTER — Encounter (HOSPITAL_BASED_OUTPATIENT_CLINIC_OR_DEPARTMENT_OTHER): Payer: Self-pay | Admitting: *Deleted

## 2014-12-01 ENCOUNTER — Emergency Department (HOSPITAL_BASED_OUTPATIENT_CLINIC_OR_DEPARTMENT_OTHER)
Admission: EM | Admit: 2014-12-01 | Discharge: 2014-12-01 | Disposition: A | Discharge status: Home / Self Care | Payer: BLUE CROSS/BLUE SHIELD | Attending: Emergency Medicine | Admitting: Emergency Medicine

## 2014-12-01 ENCOUNTER — Emergency Department (HOSPITAL_BASED_OUTPATIENT_CLINIC_OR_DEPARTMENT_OTHER): Payer: BLUE CROSS/BLUE SHIELD

## 2014-12-01 DIAGNOSIS — Z79899 Other long term (current) drug therapy: Secondary | ICD-10-CM | POA: Insufficient documentation

## 2014-12-01 DIAGNOSIS — F329 Major depressive disorder, single episode, unspecified: Secondary | ICD-10-CM | POA: Insufficient documentation

## 2014-12-01 DIAGNOSIS — Z8669 Personal history of other diseases of the nervous system and sense organs: Secondary | ICD-10-CM | POA: Insufficient documentation

## 2014-12-01 DIAGNOSIS — Y9289 Other specified places as the place of occurrence of the external cause: Secondary | ICD-10-CM | POA: Insufficient documentation

## 2014-12-01 DIAGNOSIS — N400 Enlarged prostate without lower urinary tract symptoms: Secondary | ICD-10-CM | POA: Insufficient documentation

## 2014-12-01 DIAGNOSIS — S8992XA Unspecified injury of left lower leg, initial encounter: Secondary | ICD-10-CM | POA: Diagnosis present

## 2014-12-01 DIAGNOSIS — X58XXXA Exposure to other specified factors, initial encounter: Secondary | ICD-10-CM | POA: Diagnosis not present

## 2014-12-01 DIAGNOSIS — Y9389 Activity, other specified: Secondary | ICD-10-CM | POA: Diagnosis not present

## 2014-12-01 DIAGNOSIS — Z791 Long term (current) use of non-steroidal anti-inflammatories (NSAID): Secondary | ICD-10-CM | POA: Diagnosis not present

## 2014-12-01 DIAGNOSIS — Z7982 Long term (current) use of aspirin: Secondary | ICD-10-CM | POA: Diagnosis not present

## 2014-12-01 DIAGNOSIS — M25562 Pain in left knee: Secondary | ICD-10-CM

## 2014-12-01 DIAGNOSIS — Y998 Other external cause status: Secondary | ICD-10-CM | POA: Diagnosis not present

## 2014-12-01 DIAGNOSIS — S8392XA Sprain of unspecified site of left knee, initial encounter: Secondary | ICD-10-CM

## 2014-12-01 DIAGNOSIS — K219 Gastro-esophageal reflux disease without esophagitis: Secondary | ICD-10-CM | POA: Diagnosis not present

## 2014-12-01 DIAGNOSIS — F419 Anxiety disorder, unspecified: Secondary | ICD-10-CM | POA: Diagnosis not present

## 2014-12-01 DIAGNOSIS — I10 Essential (primary) hypertension: Secondary | ICD-10-CM | POA: Insufficient documentation

## 2014-12-01 NOTE — ED Notes (Signed)
Patient here with left knee pain after twisting same yesterday while getting out of car, pain with any movement.

## 2014-12-01 NOTE — ED Notes (Signed)
Pt refused crutches.

## 2014-12-01 NOTE — ED Provider Notes (Signed)
CSN: [id number]     Arrival date & time 12/01/14  1219 History   First MD Initiated Contact with Patient 12/01/14 1233     Chief Complaint  Patient presents with  . Knee Injury     (Consider location/radiation/quality/duration/timing/severity/associated sxs/prior Treatment) The history is provided by the patient. No language interpreter was used.  GRANVIL DJORDJEVIC is a 64 y/o M with PMHx of anxiety, depression, GERD, HTN, BPH, meralgia paresthetica of the right side presenting to the ED with left knee pain that started yesterday afternoon after the patient twisted his left knee when getting out of the car. Patient reported that he has an aching sensation in his left knee with a mild throbbing discomfort that comes and goes. Stated that he feels a stretching sensation in his hamstring. Patient reported that he noticed a little swelling in his left knee, but stated that he has been icing his leg all day. Stated that 10 years ago he hurt his left knee that resolved within a couple of months. Stated that he has been seen before for his right knee at [facility], Dr. Tamala Julian regarding injury with cortisone shots and medications. Patient reported that he is able to apply pressure to the left knee without difficulty. Denied numbness, tingling, loss of sensation, surgery to the left knee, direct trauma to left knee, fever, changes to skin colored, hot to touch, hip pain. PCP Dr. Alain Marion  Past Medical History  Diagnosis Date  . Anxiety   . Depression   . GERD (gastroesophageal reflux disease)   . Hypertension   . BPH (benign prostatic hypertrophy)   . Meralgia paresthetica of right side    Past Surgical History  Procedure Laterality Date  . Hernia repair      inguinal  . Splenectomy     Family History  Problem Relation Age of Onset  . Hypertension Other   . Heart disease Mother 57    chf   History  Substance Use Topics  . Smoking status: Never Smoker   . Smokeless tobacco: Not  on file  . Alcohol Use: No    Review of Systems  Constitutional: Negative for fever.  Musculoskeletal: Positive for arthralgias (left knee pain ).  Skin: Negative for color change and wound.  Neurological: Negative for weakness and numbness.      Allergies  Bupropion hcl; Lovastatin; and Venlafaxine  Home Medications   Prior to Admission medications   Medication Sig Start Date End Date Taking? Authorizing Provider  aspirin 81 MG tablet Take 81 mg by mouth daily.      Historical Provider, MD  Cholecalciferol 1000 UNITS tablet Take 1,000 Units by mouth daily.      Historical Provider, MD  Diclofenac Sodium (PENNSAID) 2 % SOLN Place 2 application onto the skin 2 (two) times daily. 12/14/13   Lyndal Pulley, DO  finasteride (PROSCAR) 5 MG tablet TAKE 1 TABLET (5 MG TOTAL) BY MOUTH DAILY.    Aleksei Plotnikov V, MD  omeprazole (PRILOSEC) 20 MG capsule Take 1 capsule (20 mg total) by mouth daily. 09/09/12   Aleksei Plotnikov V, MD  quinapril-hydrochlorothiazide (ACCURETIC) 20-12.5 MG per tablet Take 2 tablets by mouth daily. 12/06/13   Aleksei Plotnikov V, MD  tadalafil (CIALIS) 5 MG tablet Take 5 mg by mouth daily. 06/25/11 10/06/13  Aleksei Plotnikov V, MD  tamsulosin (FLOMAX) 0.4 MG CAPS capsule Take 1 capsule (0.4 mg total) by mouth daily. 12/06/13   Cassandria Anger, MD  traMADol Veatrice Bourbon)  50 MG tablet Take 50 mg by mouth 4 (four) times daily as needed.      Historical Provider, MD   BP 153/70 mmHg  Pulse 58  Temp(Src) 97.7 F (36.5 C)  Resp 18  Ht 6' (1.829 m)  Wt 250 lb (113.399 kg)  BMI 33.90 kg/m2  SpO2 97% Physical Exam  Constitutional: He is oriented to person, place, and time. He appears well-developed and well-nourished. No distress.  HENT:  Head: Normocephalic and atraumatic.  Eyes: Conjunctivae and EOM are normal.  Neck: Normal range of motion. Neck supple.  Cardiovascular: Normal rate, regular rhythm and normal heart sounds.  Exam reveals no friction rub.   No  murmur heard. Pulses:      Radial pulses are 2+ on the right side, and 2+ on the left side.       Dorsalis pedis pulses are 2+ on the right side, and 2+ on the left side.  Cap refill < 3 seconds  Pulmonary/Chest: Effort normal and breath sounds normal. No respiratory distress. He has no wheezes. He has no rales.  Musculoskeletal: Normal range of motion. He exhibits edema and tenderness.       Left knee: He exhibits swelling and effusion. He exhibits normal range of motion, no ecchymosis, no deformity, no laceration, no erythema, no bony tenderness and normal meniscus. Tenderness found.       Legs: Mild edema noted to the left knee with negative deformities, ecchymosis, lacerations, lesions, sores, wounds. Full flexion and extension noted to the left knee without difficulty or ataxia. Full flexion of the left hip without difficulty or ataxia. FROM to the LLE.   Neurological: He is alert and oriented to person, place, and time. No cranial nerve deficit. He exhibits normal muscle tone. Coordination normal.  Strength 5+/5+ to lower extremities bilaterally with resistance applied, equal distribution noted Strength intact to the digits of the feet bilaterally  Sensation intact with differentiation to sharp and dull touch   Skin: Skin is warm and dry. No rash noted. He is not diaphoretic. No erythema.  Psychiatric: He has a normal mood and affect. His behavior is normal. Thought content normal.  Nursing note and vitals reviewed.   ED Course  Procedures (including critical care time)  Dg Knee Complete 4 Views Left  12/01/2014   CLINICAL DATA:  Twisted left knee, posterolateral pain  EXAM: LEFT KNEE - COMPLETE 4+ VIEW  COMPARISON:  None.  FINDINGS: No fracture or dislocation is seen.  The joint spaces are preserved.  The visualized soft tissues are unremarkable.  Possible small suprapatellar knee joint effusion.  IMPRESSION: No fracture or dislocation is seen.  Possible small suprapatellar knee joint  effusion.   Electronically Signed   By: Julian Hy M.D.   On: 12/01/2014 12:46    Labs Review Labs Reviewed - No data to display  Imaging Review Dg Knee Complete 4 Views Left  12/01/2014   CLINICAL DATA:  Twisted left knee, posterolateral pain  EXAM: LEFT KNEE - COMPLETE 4+ VIEW  COMPARISON:  None.  FINDINGS: No fracture or dislocation is seen.  The joint spaces are preserved.  The visualized soft tissues are unremarkable.  Possible small suprapatellar knee joint effusion.  IMPRESSION: No fracture or dislocation is seen.  Possible small suprapatellar knee joint effusion.   Electronically Signed   By: Julian Hy M.D.   On: 12/01/2014 12:46     EKG Interpretation None      MDM   Final diagnoses:  Left  knee sprain, initial encounter  Left knee pain    Medications - No data to display  Filed Vitals:   12/01/14 1229 12/01/14 1352  BP: 174/79 153/70  Pulse: 64 58  Temp: 97.7 F (36.5 C)   Resp: 18 18  Height: 6' (1.829 m)   Weight: 250 lb (113.399 kg)   SpO2: 98% 97%   Plain film of left knee no fracture dislocation is seen. Possible small suprapatellar joint effusion identified.  Cap refill less than 3 seconds. Pulses palpable and strong. Sensation intact. Strength intact to left lower extremity. Negative signs of ischemia. Patient is able to flex and extend the knee without difficulty - doubt patellar dislocation, patellar tendon rupture. Patient is able to flex and extend the left hip without difficulty - doubt quadriceps tendon rupture. Doubt septic joint. Doubt compartment syndrome. Suspicion to be left knee sprain secondary to presentation. Patient placed in knee immobilization with crutches. Patient stable, afebrile. Patient not septic appearing. Discharged patient. Referred patient to PCP and orthopedics. Discussed with patient to rest and stay hydrated. Discussed with patient to rest, ice, elevate. Discussed with patient to closely monitor symptoms and if symptoms  are to worsen or change to report back to the ED - strict return instructions given.  Patient agreed to plan of care, understood, all questions answered.   Jamse Mead, PA-C 12/01/14 1401  Veryl Speak, MD 12/01/14 [phone]

## 2014-12-01 NOTE — Discharge Instructions (Signed)
Please call your doctor for a followup appointment within 24-48 hours. When you talk to your doctor please let them know that you were seen in the emergency department and have them acquire all of your records so that they can discuss the findings with you and formulate a treatment plan to fully care for your new and ongoing problems. Please call and set up an appointment with your primary care  Provider to be seen and assessed Please follow-up with orthopedics Please keep knee immobilizer on at all times When resting please ice the knee-please have toes above nose to proper elevation Please avoid any physical or strenuous activity, please no heavy lifting Please use crutches when walking Please continue to monitor symptoms closely and if symptoms are to worsen or change (fever greater than 101, chills, sweating, nausea, vomiting, chest pain, shortness of breathe, difficulty breathing, weakness, numbness, tingling, worsening or changes to pain pattern, Fall, injury, increased swelling, redness to the knee, red streaks running up or down the leg, tightness to the calf, calf pain, changes to skin colored to the toes or foot, coldness to the foot or toes) please report back to the Emergency Department immediately.    Combined Knee Ligament Sprain Combined knee ligament sprain is a tear of more than one of the major ligaments of the knee. The four knee ligaments are the anterior cruciate ligament (ACL), posterior cruciate ligament (PCL), medial collateral ligament (MCL) and lateral collateral ligament (LCL). Ligaments connect bones. They often cross a joint to hold the bones together. The ligaments of the knee keep the thigh bone (femur) and shinbone (tibia) in alignment. These ligaments allow the joint to move within a certain range of motion. Movement outside this range causes a ligament strain. Injury to multiple ligaments at the same time results in difficulty playing sports and in daily living. The most  common multiple knee ligament injury involves the ACL and MCL. SYMPTOMS   A "popping" sound heard or felt at the time of injury.  Inability to continue activity after injury.  Inflammation of the knee within 6 hours after injury.  Possibly, deformity of the knee.  Inability to straighten the knee.  Feeling of the knee giving way or buckling.  Sometimes, locking of the knee, if the joint cartilage (meniscus) is injured.  Rarely, numbness, weakness, paralysis, discoloration, or coldness, due to nerve or blood vessel injury. CAUSES  Spraining of multiple ligaments occurs when a force is placed on the ligaments that exceeds their strength. This is often caused by a direct hit (trauma). It may also be caused by a non-contact injury (hyperextending the knee while twisting it).  RISK INCREASES WITH:  Contact sports (football, rugby, lacrosse). Sports that involve pivoting, jumping, cutting, or changing direction (basketball, gymnastics, soccer, volleyball). Sports on uneven ground (cross-country running, soccer).  Poor strength and/or flexibility.  Improper fitted or padded equipment. PREVENTION  Warm up and stretch properly before activity.  Maintain physical fitness:  Thigh, leg, and knee flexibility.  Muscle strength and endurance.  Learn and use proper exercise technique.  Wear proper and well fitting equipment (correct length of cleats for surface). PROGNOSIS  Without treatment, the knee will continue to give way and become vulnerable to recurring injury. Recurring injury can happen during athletics or daily living. If the injury includes damage to a nerve or artery, the chance of a poor outcome increases. Surgery is often needed to regain stability of the knee. RELATED COMPLICATIONS  Frequently recurring symptoms, including:  Knee giving  way.  Joint instability.  Inflammation.  Injury to the joint cartilage (meniscus). This may result in locking and/or swelling of  the knee.  Injury to joint (articular) cartilage of the thigh bone or shinbone. This may result in arthritis of the knee.  Injury to other ligaments of the knee.  Knee stiffness (loss of knee motion).  Permanent injury to nerves (numbness, weakness, or paralysis) or arteries.  Removal (amputation) of the leg, due to nerve or artery injury. TREATMENT  Treatment first involves medicine and ice, to reduce pain and inflammation. Crutches may be advised, to decrease pain while walking. The knee may be restrained. Rehabilitation focuses on reducing swelling, regaining range of motion, and regaining muscle control and strength. It may also include receiving proper use training, wearing a brace, and education. (Avoid sports that involve pivoting, cutting, changing direction, jumping and landing). Surgery often offers the best chance for full recovery. Surgery from combined ACL/MCL injury involves replacement (reconstruction) of the ACL. This also allows for MCL healing. Despite surgery, some athletes may never return to their prior level of competition. The ability to return to sports depends on the related injuries and demands of the sport.  MEDICATION   If pain medicine is needed, nonsteroidal anti-inflammatory medicines (aspirin and ibuprofen), or other minor pain relievers (acetaminophen), are often advised.  Do not take pain medicine for 7 days before surgery.  Stronger pain relievers may be prescribed. Use only as directed and only as much as you need.  Contact your caregiver immediately if any bleeding, stomach upset, or signs of an allergic reaction occur. COLD THERAPY  Cold treatment (icing) should be applied for 10 to 15 minutes every 2 to 3 hours for inflammation and pain, and immediately after activity that aggravates your symptoms. Use ice packs or an ice massage. SEEK MEDICAL CARE IF:   Symptoms get worse or do not improve in 6 weeks, despite treatment.  After injury or surgery, any  of the following occur:  Pain, numbness, coldness, or a blue, gray, or dark color occurs in the foot or toenails.  Increased pain, swelling, redness, drainage of fluids, or bleeding in the affected area.  Signs of infection (headache, muscle aches, dizziness, or a general ill feeling with fever).  New, unexplained symptoms develop. (Drugs used in treatment may produce side effects.) Document Released: 10/12/2005 Document Revised: 01/04/2012 Document Reviewed: 01/24/2009 [facility] Patient Information 2015 [place], [place]. This information is not intended to replace advice given to you by your health care provider. Make sure you discuss any questions you have with your health care provider.

## 2014-12-06 ENCOUNTER — Ambulatory Visit: Payer: BLUE CROSS/BLUE SHIELD | Admitting: Internal Medicine

## 2014-12-07 ENCOUNTER — Other Ambulatory Visit (INDEPENDENT_AMBULATORY_CARE_PROVIDER_SITE_OTHER): Payer: BLUE CROSS/BLUE SHIELD

## 2014-12-07 ENCOUNTER — Encounter: Payer: Self-pay | Admitting: Family Medicine

## 2014-12-07 ENCOUNTER — Ambulatory Visit (INDEPENDENT_AMBULATORY_CARE_PROVIDER_SITE_OTHER): Payer: BLUE CROSS/BLUE SHIELD | Admitting: Family Medicine

## 2014-12-07 VITALS — BP 140/88 | HR 59 | Ht 72.0 in | Wt 259.0 lb

## 2014-12-07 DIAGNOSIS — S8992XA Unspecified injury of left lower leg, initial encounter: Secondary | ICD-10-CM

## 2014-12-07 DIAGNOSIS — S8990XA Unspecified injury of unspecified lower leg, initial encounter: Secondary | ICD-10-CM | POA: Insufficient documentation

## 2014-12-07 DIAGNOSIS — M25562 Pain in left knee: Secondary | ICD-10-CM

## 2014-12-07 NOTE — Patient Instructions (Signed)
Good to see you Keep up with the chiropractor.  Ice can help still with a little inflammation.  Continue the pennsaid 2 times daily for next week then as needed Continue the compression for 2 weeks Exercises 3 times a week.  See me in 3 weeks if not perfect.    Popliteus Tendinitis with Rehab Tendonitis is a condition that is characterized by inflammation of a tendon. A tendon is the soft tissue that connects muscles to the skeletal system allowing for body movements. Popliteus tendonitis affects the popliteus tendon, which connects the popliteus muscle to the thigh bone (femur) near the knee. The popliteus muscle helps bend and rotate the knee. Popliteus tendonitis is often caused by a tendon tear (strain). Strains are classified into three categories. Grade 1 strains cause pain, but the tendon is not lengthened. Grade 2 strains include a lengthened ligament due to the ligament being stretched or partially ruptured. With grade 2 strains there is still function, although the function may be diminished. Grade 3 strains are characterized by a complete tear of the tendon or muscle, and function is usually impaired. SYMPTOMS   Pain in the knee, specifically the outer (lateral) and back (posterior) portions.  Pain that worsens with use of the popliteus muscle (standing on a slightly bent knee or rotating the knee).  A crackling sound (crepitation) when the tendon is moved or touched (uncommon, except when tested just after exercising). CAUSES Popliteus tendonitis occurs when damage to the popliteus tendon elicits an inflammatory (healing) response. Popliteus tendonitis is often an overuse injury. RISK INCREASES WITH:  Activities that require extensive running or walking downhill.  Poor strength and flexibility.  Failure to warm-up properly before activity.  Flat feet. PREVENTION  Warm up and stretch properly before activity.  Allow for adequate recovery between workouts.  Maintain  physical fitness:  Strength, flexibility, and endurance.  Cardiovascular fitness.  Learn and implement proper training regimens and sports technique.  Arch supports (orthotics) for individuals with flat feet. PROGNOSIS  If treated properly, then the symptoms of popliteus tendonitis usually resolve within 6 weeks.  RELATED COMPLICATIONS   Prolonged healing time, if improperly treated or re-injured.  Recurrent symptoms that result in a chronic problem. TREATMENT  Treatment initially involves the use of ice and medication to help reduce pain and inflammation. The use of strengthening and stretching exercises may help reduce pain with activity. These exercises may be performed at home or with referral to a therapist. Many individuals find that the use of a compression bandage or a knee sleeve helps reduce symptoms. If you have flat feet, then your caregiver may recommend arch supports. It is important to learn/modify techniques for running uphill/downhill that do not aggravate your symptoms. If symptoms persist for greater than 6 months despite conservative (non-surgical) treatment, then surgery may be recommended to remove the tendon sheath (lining).  MEDICATION   If pain medication is necessary, then nonsteroidal anti-inflammatory medications, such as aspirin and ibuprofen, or other minor pain relievers, such as acetaminophen, are often recommended.  Do not take pain medication within 7 days before surgery.  Prescription pain relievers may be given if deemed necessary by your caregiver. Use only as directed and only as much as you need. HEAT AND COLD  Cold treatment (icing) relieves pain and reduces inflammation. Cold treatment should be applied for 10 to 15 minutes every 2 to 3 hours for inflammation and pain and immediately after any activity that aggravates your symptoms. Use ice packs or massage the  area with a piece of ice (ice massage).  Heat treatment may be used prior to performing  the stretching and strengthening activities prescribed by your caregiver, physical therapist, or athletic trainer. Use a heat pack or soak the injury in warm water. SEEK MEDICAL CARE IF:  Treatment seems to offer no benefit, or the condition worsens.  Any medications produce adverse side effects. EXERCISES RANGE OF MOTION (ROM) AND STRETCHING EXERCISES - Popliteus Tendinitis These exercises may help you when beginning to rehabilitate your injury. Your symptoms may resolve with or without further involvement from your physician, physical therapist or athletic trainer. While completing these exercises, remember:   Restoring tissue flexibility helps normal motion to return to the joints. This allows healthier, less painful movement and activity.  An effective stretch should be held for at least 30 seconds.  A stretch should never be painful. You should only feel a gentle lengthening or release in the stretched tissue. STRETCH - Gastroc, Standing   Place hands on wall.  Extend right / left leg, keeping the front knee somewhat bent.  Slightly point your toes inward on your back foot.  Keeping your right / left heel on the floor and your knee straight, shift your weight toward the wall, not allowing your back to arch.  You should feel a gentle stretch in the right / left calf. Hold this position for __________ seconds. Repeat __________ times. Complete this stretch __________ times per day. STRETCH - Soleus, Standing   Place hands on wall.  Extend right / left leg, keeping the other knee somewhat bent.  Slightly point your toes inward on your back foot.  Keep your right / left heel on the floor, bend your back knee, and slightly shift your weight over the back leg so that you feel a gentle stretch deep in your back calf.  Hold this position for __________ seconds. Repeat __________ times. Complete this stretch __________ times per day. STRETCH - Gastrocsoleus, Standing  Note: This  exercise can place a lot of stress on your foot and ankle. Please complete this exercise only if specifically instructed by your caregiver.   Place the ball of your right / left foot on a step, keeping your other foot firmly on the same step.  Hold on to the wall or a rail for balance.  Slowly lift your other foot, allowing your body weight to press your heel down over the edge of the step.  You should feel a stretch in your right / left calf.  Hold this position for __________ seconds.  Repeat this exercise with a slight bend in your right / left knee. Repeat __________ times. Complete this stretch __________ times per day.  STRETCH - Hamstrings, Standing  Stand or sit and extend your right / left leg, placing your foot on a chair or foot stool  Keeping a slight arch in your low back and your hips straight forward.  Lead with your chest and lean forward at the waist until you feel a gentle stretch in the back of your right / left knee or thigh. (When done correctly, this exercise requires leaning only a small distance.)  Hold this position for __________ seconds. Repeat __________ times. Complete this stretch __________ times per day. STRETCH - Hamstrings, Supine   Lie on your back. Loop a belt or towel over the ball of your right / left foot.  Straighten your right / left knee and slowly pull on the belt to raise your leg. Do not  allow the right / left knee to bend. Keep your opposite leg flat on the floor.  Raise the leg until you feel a gentle stretch behind your right / left knee or thigh. Hold this position for __________ seconds. Repeat __________ times. Complete this stretch __________ times per day.  STRETCH - Hamstrings, Doorway  Lie on your back with your right / left leg extended and resting on the wall and the opposite leg flat on the ground through the door. Initially, position your bottom farther away from the wall than the illustration shows.  Keep your right /  left knee straight. If you feel a stretch behind your knee or thigh, hold this position for __________ seconds.  If you do not feel a stretch, scoot your bottom closer to the door, and hold __________ seconds. Repeat __________ times. Complete this stretch __________ times per day.  STRETCH - Quadriceps, Prone   Lie on your stomach on a firm surface, such as a bed or padded floor.  Bend your right / left knee and grasp your ankle. If you are unable to reach, your ankle or pant leg, use a belt around your foot to lengthen your reach.  Gently pull your heel toward your buttocks. Your knee should not slide out to the side. You should feel a stretch in the front of your thigh and/or knee.  Hold this position for __________ seconds. Repeat __________ times. Complete this stretch __________ times per day.  STRENGTHENING EXERCISES - Popliteus Tendinitis These exercises may help you when beginning to rehabilitate your injury. They may resolve your symptoms with or without further involvement from your physician, physical therapist or athletic trainer. While completing these exercises, remember:   Muscles can gain both the endurance and the strength needed for everyday activities through controlled exercises.  Complete these exercises as instructed by your physician, physical therapist or athletic trainer. Progress the resistance and repetitions only as guided. STRENGTH - Hamstring, Isometrics   Lie on your back on a firm surface.  Bend your right / left knee approximately __________ degrees.  Dig your heel into the surface as if you are trying to pull it toward your buttocks. Tighten the muscles in the back of your thighs to "dig" as hard as you can without increasing any pain.  Hold this position for __________ seconds.  Release the tension gradually and allow your muscle to completely relax for __________ seconds in between each exercise. Repeat __________ times. Complete this exercise  __________ times per day.  STRENGTH - Hamstring, Curls   Lay on your stomach with your legs extended. (If you lay on a bed, your feet may hang over the edge.)  Tighten the muscles in the back of your thigh to bend your right / left knee up to 90 degrees. Keep your hips flat on the bed/floor.  Hold this position for __________ seconds.  Slowly lower your leg back to the starting position. Repeat __________ times. Complete this exercise __________ times per day.  OPTIONAL ANKLE WEIGHTS: Begin with ____________________, but DO NOT exceed ____________________. Increase in1 lb/0.5 kg increments. Document Released: 10/12/2005 Document Revised: 01/04/2012 Document Reviewed: 01/24/2009 [facility] Patient Information 2015 [place], [place]. This information is not intended to replace advice given to you by your health care provider. Make sure you discuss any questions you have with your health care provider.

## 2014-12-07 NOTE — Progress Notes (Signed)
Pre visit review using our clinic review tool, if applicable. No additional management support is needed unless otherwise documented below in the visit note. 

## 2014-12-07 NOTE — Assessment & Plan Note (Signed)
Patient given HEP, icing, compression, Topical Nsaids, discussed avoiding activity RTC in 3 weeks.  Spent  25 minutes with patient face-to-face and had greater than 50% of counseling including as described above in assessment and plan.

## 2014-12-07 NOTE — Progress Notes (Signed)
   CC: left knee pain  HPI: patient is here for acute pain on his left knee. Patient was seen approximately 1 year ago for his right knee and was found to havechronic meniscal tear. Patient did do very well with conservative therapy after injection.  Patient though states that he was getting out of his car or truck and twisted his left knee. Patient heard an audible pop and then had significant amount of swelling and pain. Patient was seen in the emergency department and was diagnosed with a sprain. Patient describes his pain as more of a dull throbbing aching pain. Stated to get better after taking some anti-inflammatories. Patient did have some tramadol as well. Patient states that over the course of the last 72 hours it has improved where he is now ambulating better. Denies any radiation down the leg or any numbness or tingling. Patient rates the severity of pain and now is 3 out of 10.      Past medical, surgical, family and social history reviewed. Medications reviewed all in the electronic medical record.   Review of Systems: No headache, visual changes, nausea, vomiting, diarrhea, constipation, dizziness, abdominal pain, skin rash, fevers, chills, night sweats, weight loss, swollen lymph nodes, body aches, joint swelling, muscle aches, chest pain, shortness of breath, mood changes.   Objective:    Blood pressure 140/88, pulse 59, height 6' (1.829 m), weight 259 lb (117.482 kg), SpO2 97 %.   General: No apparent distress alert and oriented x3 mood and affect normal, dressed appropriately.  HEENT: Pupils equal, extraocular movements intact Respiratory: Patient's speak in full sentences and does not appear short of breath Cardiovascular: No lower extremity edema, non tender, no erythema Skin: Warm dry intact with no signs of infection or rash on extremities or on axial skeleton. Abdomen: Soft nontender Neuro: Cranial nerves II through XII are intact, neurovascularly intact in all  extremities with 2+ DTRs and 2+ pulses. Lymph: No lymphadenopathy of posterior or anterior cervical chain or axillae bilaterally.  Gait normal with good balance and coordination.  MSK: Non tender with full range of motion and good stability and symmetric strength and tone of shoulders, elbows, wrist, and ankles bilaterally.  Knee: left Mild valgus deformity Patient some mild tenderness over the lateral joint line ROM full in flexion and extension and lower leg rotation. Ligaments with solid consistent endpoints including ACL, PCL, LCL, MCL. Negative Mcmurray's, Apley's, and Thessalonian tests. Non painful patellar compression. Patellar glide without crepitus. Patellar and quadriceps tendons unremarkable. Hamstring and quadriceps strength is normal.  Contralateral knee moderate grinding noted a full range of motion and full strength in neurovascularly intact distally.  MSK US performed of: Left This study was ordered, performed, and interpreted by [name] D.O.  Knee: All structures visualized. Anteromedial, anterolateral, posteromedial, and posterolateral menisci unremarkable without tearing, fraying, effusion, or displacement. Severe arthritis. All compartment.s Significant swelling of popliteus no tear.  Patellar Tendon unremarkable on long and transverse views without effusion. No abnormality of prepatellar bursa. LCL and MCL unremarkable on long and transverse views. No abnormality of origin of medial or lateral head of the gastrocnemius.  IMPRESSION:  Popliteus tendonitis and severe arthritis.    Impression and Recommendations:     This case required medical decision making of moderate complexity.

## 2014-12-28 ENCOUNTER — Ambulatory Visit (INDEPENDENT_AMBULATORY_CARE_PROVIDER_SITE_OTHER): Payer: BLUE CROSS/BLUE SHIELD | Admitting: Internal Medicine

## 2014-12-28 ENCOUNTER — Encounter: Payer: Self-pay | Admitting: Internal Medicine

## 2014-12-28 ENCOUNTER — Ambulatory Visit (INDEPENDENT_AMBULATORY_CARE_PROVIDER_SITE_OTHER)
Admission: RE | Admit: 2014-12-28 | Discharge: 2014-12-28 | Disposition: A | Discharge status: Home / Self Care | Payer: BLUE CROSS/BLUE SHIELD | Source: Ambulatory Visit | Attending: Internal Medicine | Admitting: Internal Medicine

## 2014-12-28 VITALS — BP 146/80 | HR 65 | Temp 98.2°F | Wt 260.8 lb

## 2014-12-28 DIAGNOSIS — R05 Cough: Secondary | ICD-10-CM

## 2014-12-28 DIAGNOSIS — J208 Acute bronchitis due to other specified organisms: Secondary | ICD-10-CM

## 2014-12-28 DIAGNOSIS — N528 Other male erectile dysfunction: Secondary | ICD-10-CM

## 2014-12-28 DIAGNOSIS — F528 Other sexual dysfunction not due to a substance or known physiological condition: Secondary | ICD-10-CM

## 2014-12-28 DIAGNOSIS — N529 Male erectile dysfunction, unspecified: Secondary | ICD-10-CM | POA: Insufficient documentation

## 2014-12-28 DIAGNOSIS — R059 Cough, unspecified: Secondary | ICD-10-CM

## 2014-12-28 DIAGNOSIS — I10 Essential (primary) hypertension: Secondary | ICD-10-CM

## 2014-12-28 DIAGNOSIS — J209 Acute bronchitis, unspecified: Secondary | ICD-10-CM | POA: Insufficient documentation

## 2014-12-28 MED ORDER — QUINAPRIL-HYDROCHLOROTHIAZIDE 20-12.5 MG PO TABS: 2.0000 | ORAL_TABLET | Freq: Every day | ORAL | Status: DC

## 2014-12-28 MED ORDER — TADALAFIL 20 MG PO TABS: 20.0000 mg | ORAL_TABLET | Freq: Every day | ORAL | Status: DC | PRN

## 2014-12-28 MED ORDER — PROMETHAZINE-CODEINE 6.25-10 MG/5ML PO SYRP: 5.0000 mL | ORAL_SOLUTION | ORAL | Status: DC | PRN

## 2014-12-28 MED ORDER — TAMSULOSIN HCL 0.4 MG PO CAPS: 0.4000 mg | ORAL_CAPSULE | Freq: Every day | ORAL | Status: DC

## 2014-12-28 NOTE — Assessment & Plan Note (Signed)
Cialis 20 mg po qd prn

## 2014-12-28 NOTE — Assessment & Plan Note (Signed)
Denies cough on ACE

## 2014-12-28 NOTE — Progress Notes (Signed)
Subjective:    HPI  C/o bronchitis sx's. Was seen 1 wk ago and given Augmentin (makes stomach upset) Pt stopped Prednisone due to high BP. C/o ED Proair has helped He is 75% better F/u HTN, GERD, elev PSA and anxiety    Review of Systems  Constitutional: Negative for fever, appetite change, fatigue and unexpected weight change.  HENT: Negative for congestion, nosebleeds, sneezing, sore throat and trouble swallowing.   Eyes: Negative for itching and visual disturbance.  Respiratory: Positive for cough. Negative for shortness of breath.   Cardiovascular: Negative for chest pain, palpitations and leg swelling.  Gastrointestinal: Negative for nausea, diarrhea, blood in stool, abdominal distention and anal bleeding.  Genitourinary: Negative for frequency and hematuria.  Musculoskeletal: Negative for back pain, joint swelling, gait problem and neck pain.  Skin: Negative for rash.  Neurological: Negative for dizziness, tremors, speech difficulty and weakness.  Psychiatric/Behavioral: Negative for suicidal ideas, sleep disturbance, dysphoric mood and agitation. The patient is not nervous/anxious and is not hyperactive.    Wt Readings from Last 3 Encounters:  12/28/14 260 lb 12 oz (118.275 kg)  12/07/14 259 lb (117.482 kg)  12/01/14 250 lb (113.399 kg)   BP Readings from Last 3 Encounters:  12/28/14 146/80  12/07/14 140/88  12/01/14 153/70        Objective:   Physical Exam  Constitutional: He is oriented to person, place, and time. He appears well-developed. No distress.  Obese  HENT:  Head: Normocephalic and atraumatic.  Right Ear: External ear normal.  Left Ear: External ear normal.  Nose: Nose normal.  Mouth/Throat: Oropharynx is clear and moist. No oropharyngeal exudate.  Eyes: Conjunctivae and EOM are normal. Pupils are equal, round, and reactive to light. Right eye exhibits no discharge. Left eye exhibits no discharge. No scleral icterus.  Neck: Normal range of  motion. Neck supple. No JVD present. No tracheal deviation present. No thyromegaly present.  Cardiovascular: Normal rate, regular rhythm, normal heart sounds and intact distal pulses.  Exam reveals no gallop and no friction rub.   No murmur heard. Pulmonary/Chest: Effort normal and breath sounds normal. No stridor. No respiratory distress. He has no wheezes. He has no rales. He exhibits no tenderness.  Abdominal: Soft. Bowel sounds are normal. He exhibits mass (hernia is better). He exhibits no distension. There is no tenderness. There is no rebound and no guarding.  Genitourinary: Rectum normal and penis normal. Guaiac negative stool. No penile tenderness.  Musculoskeletal: Normal range of motion. He exhibits no edema or tenderness.  Lymphadenopathy:    He has no cervical adenopathy.  Neurological: He is alert and oriented to person, place, and time. He has normal reflexes. No cranial nerve deficit. He exhibits normal muscle tone. Coordination normal.  Skin: Skin is warm and dry. No rash noted. He is not diaphoretic. No erythema. No pallor.  Psychiatric: He has a normal mood and affect. His behavior is normal. Judgment and thought content normal.    Lab Results  Component Value Date   WBC 7.6 12/06/2013   HGB 15.7 12/06/2013   HCT 49.1 12/06/2013   PLT 323.0 12/06/2013   CHOL 195 09/02/2012   TRIG 87.0 09/02/2012   HDL 43.20 09/02/2012   LDLDIRECT 151.8 05/15/2010   ALT 16 12/06/2013   AST 17 12/06/2013   NA 141 12/06/2013   K 4.7 12/06/2013   CL 101 12/06/2013   CREATININE 0.9 12/06/2013   BUN 13 12/06/2013   CO2 34* 12/06/2013   TSH 2.18 12/06/2013  PSA 1.74 09/02/2012   INR 0.93 08/12/2010        Assessment & Plan:

## 2014-12-28 NOTE — Assessment & Plan Note (Signed)
CXR D/c abx  Prom/cod

## 2015-03-11 ENCOUNTER — Other Ambulatory Visit: Payer: Self-pay | Admitting: Internal Medicine

## 2015-07-04 ENCOUNTER — Ambulatory Visit (INDEPENDENT_AMBULATORY_CARE_PROVIDER_SITE_OTHER): Payer: BLUE CROSS/BLUE SHIELD | Admitting: Internal Medicine

## 2015-07-04 ENCOUNTER — Encounter: Payer: Self-pay | Admitting: Internal Medicine

## 2015-07-04 VITALS — BP 150/90 | HR 64 | Temp 98.2°F | Ht 72.0 in | Wt 258.0 lb

## 2015-07-04 DIAGNOSIS — F411 Generalized anxiety disorder: Secondary | ICD-10-CM | POA: Diagnosis not present

## 2015-07-04 DIAGNOSIS — M545 Low back pain, unspecified: Secondary | ICD-10-CM | POA: Insufficient documentation

## 2015-07-04 DIAGNOSIS — I10 Essential (primary) hypertension: Secondary | ICD-10-CM

## 2015-07-04 LAB — POCT URINALYSIS DIPSTICK
Bilirubin, UA: NEGATIVE
Blood, UA: NEGATIVE
Glucose, UA: NEGATIVE
Ketones, UA: NEGATIVE
LEUKOCYTES UA: NEGATIVE
Nitrite, UA: NEGATIVE
PROTEIN UA: NEGATIVE
SPEC GRAV UA: 1.025
Urobilinogen, UA: 2
pH, UA: 6

## 2015-07-04 MED ORDER — TRAMADOL HCL 50 MG PO TABS: 50.0000 mg | ORAL_TABLET | Freq: Four times a day (QID) | ORAL | Status: DC | PRN

## 2015-07-04 MED ORDER — PREDNISONE 10 MG PO TABS: ORAL_TABLET | ORAL | Status: DC

## 2015-07-04 MED ORDER — CYCLOBENZAPRINE HCL 5 MG PO TABS: 5.0000 mg | ORAL_TABLET | Freq: Three times a day (TID) | ORAL | Status: DC | PRN

## 2015-07-04 NOTE — Assessment & Plan Note (Signed)
Most likely c/w msk strain or DJD, DDD flare  - for Udip but likely to be negative I think, ok for tramadol prn, muscle relaxer prn, and predpac asd, for f/u PCP if not improved 2-3 days

## 2015-07-04 NOTE — Addendum Note (Signed)
Addended by: Lyman Bishop on: 07/04/2015 06:20 PM   Modules accepted: Orders

## 2015-07-04 NOTE — Patient Instructions (Signed)
Please take all new medication as prescribed - the pain medication, prednisone, and muscle relaxer as needed  Your urine specimen was negative for infection  Please continue all other medications as before, and refills have been done if requested.  Please have the pharmacy call with any other refills you may need.  Please keep your appointments with your specialists as you may have planned

## 2015-07-04 NOTE — Progress Notes (Signed)
Subjective:    Patient ID: Jeffrey [name], male    DOB: 10/31/1950, 64 y.o.   MRN: [id number]  HPI  Here to f/u with c/o 2 -3 days onset right LBP after bending to fix a faucet at home, but no bowel or bladder change, fever, wt loss,  worsening LE pain/numbness/weakness, gait change or falls.  Denies urinary symptoms such as dysuria, frequency, urgency, flank pain, hematuria or n/v, fever, chills. Pt denies chest pain, increased sob or doe, wheezing, orthopnea, PND, increased LE swelling, palpitations, dizziness or syncope.  He is very concerned about UTI and PNA as he had back discomfort with these as well in fairly recent past, and is s/p splenectomy, going out of town next wk.  Sees urology with BPH, no hx of renal stone. Denies worsening depressive symptoms, suicidal ideation, or panic; Past Medical History  Diagnosis Date  . Anxiety   . Depression   . GERD (gastroesophageal reflux disease)   . Hypertension   . BPH (benign prostatic hypertrophy)   . Meralgia paresthetica of right side    Past Surgical History  Procedure Laterality Date  . Hernia repair      inguinal  . Splenectomy      reports that he has never smoked. He does not have any smokeless tobacco history on file. He reports that he does not drink alcohol or use illicit drugs. family history includes Heart disease (age of onset: 21) in his mother; Hypertension in his other. Allergies  Allergen Reactions  . Bupropion Hcl   . Lovastatin     REACTION: myalgia  . Venlafaxine    Current Outpatient Prescriptions on File Prior to Visit  Medication Sig Dispense Refill  . albuterol (PROVENTIL HFA;VENTOLIN HFA) 108 (90 BASE) MCG/ACT inhaler Inhale 2 puffs into the lungs every 6 (six) hours as needed for wheezing or shortness of breath.    Marland Kitchen aspirin 81 MG tablet Take 81 mg by mouth daily.      . Cholecalciferol 1000 UNITS tablet Take 1,000 Units by mouth daily.      . Diclofenac Sodium (PENNSAID) 2 % SOLN Place 2 application  onto the skin 2 (two) times daily. 112 g 1  . finasteride (PROSCAR) 5 MG tablet TAKE 1 TABLET (5 MG TOTAL) BY MOUTH DAILY. 90 tablet 2  . glucosamine-chondroitin 500-400 MG tablet Take 2 tablets by mouth 1 day or 1 dose.    Marland Kitchen omeprazole (PRILOSEC) 20 MG capsule Take 1 capsule (20 mg total) by mouth daily. 90 capsule 3  . promethazine-codeine (PHENERGAN WITH CODEINE) 6.25-10 MG/5ML syrup Take 5 mLs by mouth every 4 (four) hours as needed. 300 mL 0  . quinapril-hydrochlorothiazide (ACCURETIC) 20-12.5 MG per tablet Take 2 tablets by mouth daily. 180 tablet 3  . tadalafil (CIALIS) 20 MG tablet Take 1 tablet (20 mg total) by mouth daily as needed for erectile dysfunction. 12 tablet 3  . tamsulosin (FLOMAX) 0.4 MG CAPS capsule Take 1 capsule (0.4 mg total) by mouth daily. 90 capsule 2  . traMADol (ULTRAM) 50 MG tablet Take 50 mg by mouth 4 (four) times daily as needed.      . vitamin B-12 (CYANOCOBALAMIN) 500 MCG tablet Take 500 mcg by mouth daily.    . vitamin C (ASCORBIC ACID) 500 MG tablet Take 500 mg by mouth.     No current facility-administered medications on file prior to visit.   Review of Systems  Constitutional: Negative for unusual diaphoresis or night sweats HENT: Negative for  ringing in ear or discharge Eyes: Negative for double vision or worsening visual disturbance.  Respiratory: Negative for choking and stridor.   Gastrointestinal: Negative for vomiting or other signifcant bowel change Genitourinary: Negative for hematuria or change in urine volume.  Musculoskeletal: Negative for other MSK pain or swelling Skin: Negative for color change and worsening wound.  Neurological: Negative for tremors and numbness other than noted  Psychiatric/Behavioral: Negative for decreased concentration or agitation other than above       Objective:   Physical Exam BP 150/90 mmHg  Pulse 64  Temp(Src) 98.2 F (36.8 C) (Oral)  Ht 6' (1.829 m)  Wt 258 lb (117.028 kg)  BMI 34.98 kg/m2  SpO2  98% VS noted,  Constitutional: Pt appears in no significant distress HENT: Head: NCAT.  Right Ear: External ear normal.  Left Ear: External ear normal.  Eyes: . Pupils are equal, round, and reactive to light. Conjunctivae and EOM are normal Neck: Normal range of motion. Neck supple.  Cardiovascular: Normal rate and regular rhythm.   Pulmonary/Chest: Effort normal and breath sounds without rales or wheezing.  Abd:  Soft, NT, ND, + BS, no flank tender Spine: nontender, no paravertebral tender or spasm appreciated Neurological: Pt is alert. Not confused , motor grossly intact Skin: Skin is warm. No rash, no LE edema Psychiatric: Pt behavior is normal. No agitation. mild nervous, not depressed affect    Assessment & Plan:

## 2015-07-04 NOTE — Assessment & Plan Note (Signed)
stable overall by history and exam, and pt to continue medical treatment as before,  to f/u any worsening symptoms or concerns 

## 2015-07-04 NOTE — Assessment & Plan Note (Signed)
Mild elev today likely situational, o/w stable overall by history and exam, recent data reviewed with pt, and pt to continue medical treatment as before,  to f/u any worsening symptoms or concerns BP Readings from Last 3 Encounters:  07/04/15 150/90  12/28/14 146/80  12/07/14 140/88

## 2015-07-04 NOTE — Progress Notes (Signed)
Pre visit review using our clinic review tool, if applicable. No additional management support is needed unless otherwise documented below in the visit note. 

## 2015-08-06 ENCOUNTER — Telehealth: Payer: Self-pay | Admitting: Internal Medicine

## 2015-08-06 DIAGNOSIS — Z1211 Encounter for screening for malignant neoplasm of colon: Secondary | ICD-10-CM

## 2015-08-06 NOTE — Telephone Encounter (Signed)
Ok Thx 

## 2015-08-06 NOTE — Telephone Encounter (Signed)
Pt called in and would like for Plot to pt a referral in for him to get a coloscopy

## 2015-08-07 NOTE — Telephone Encounter (Signed)
Notified pt md has place referral../lmb 

## 2015-08-13 ENCOUNTER — Ambulatory Visit (AMBULATORY_SURGERY_CENTER): Payer: Self-pay | Admitting: *Deleted

## 2015-08-13 VITALS — Ht 72.0 in | Wt 259.2 lb

## 2015-08-13 DIAGNOSIS — Z1211 Encounter for screening for malignant neoplasm of colon: Secondary | ICD-10-CM

## 2015-08-13 MED ORDER — NA SULFATE-K SULFATE-MG SULF 17.5-3.13-1.6 GM/177ML PO SOLN: ORAL | Status: DC

## 2015-08-13 NOTE — Progress Notes (Signed)
No egg or soy allergy  No anesthesia or intubation problems per pt  No diet medications taken  Registered in EMMI   

## 2015-08-22 ENCOUNTER — Other Ambulatory Visit: Payer: Self-pay | Admitting: Internal Medicine

## 2015-08-27 ENCOUNTER — Ambulatory Visit (AMBULATORY_SURGERY_CENTER): Payer: BLUE CROSS/BLUE SHIELD | Admitting: Internal Medicine

## 2015-08-27 ENCOUNTER — Encounter: Payer: Self-pay | Admitting: Internal Medicine

## 2015-08-27 VITALS — BP 113/80 | HR 53 | Temp 97.5°F | Resp 19 | Ht 72.0 in | Wt 259.0 lb

## 2015-08-27 DIAGNOSIS — Z1211 Encounter for screening for malignant neoplasm of colon: Secondary | ICD-10-CM

## 2015-08-27 MED ORDER — SODIUM CHLORIDE 0.9 % IV SOLN: 500.0000 mL | INTRAVENOUS | Status: DC

## 2015-08-27 NOTE — Patient Instructions (Signed)
Discharge instructions given. Normal exam. Resume previous medications. YOU HAD AN ENDOSCOPIC PROCEDURE TODAY AT THE [facility]:   Refer to the procedure report that was given to you for any specific questions about what was found during the examination.  If the procedure report does not answer your questions, please call your gastroenterologist to clarify.  If you requested that your care partner not be given the details of your procedure findings, then the procedure report has been included in a sealed envelope for you to review at your convenience later.  YOU SHOULD EXPECT: Some feelings of bloating in the abdomen. Passage of more gas than usual.  Walking can help get rid of the air that was put into your GI tract during the procedure and reduce the bloating. If you had a lower endoscopy (such as a colonoscopy or flexible sigmoidoscopy) you may notice spotting of blood in your stool or on the toilet paper. If you underwent a bowel prep for your procedure, you may not have a normal bowel movement for a few days.  Please Note:  You might notice some irritation and congestion in your nose or some drainage.  This is from the oxygen used during your procedure.  There is no need for concern and it should clear up in a day or so.  SYMPTOMS TO REPORT IMMEDIATELY:   Following lower endoscopy (colonoscopy or flexible sigmoidoscopy):  Excessive amounts of blood in the stool  Significant tenderness or worsening of abdominal pains  Swelling of the abdomen that is new, acute  Fever of 100F or higher   For urgent or emergent issues, a gastroenterologist can be reached at any hour by calling [phone].   DIET: Your first meal following the procedure should be a small meal and then it is ok to progress to your normal diet. Heavy or fried foods are harder to digest and may make you feel nauseous or bloated.  Likewise, meals heavy in dairy and vegetables can increase bloating.  Drink plenty  of fluids but you should avoid alcoholic beverages for 24 hours.  ACTIVITY:  You should plan to take it easy for the rest of today and you should NOT DRIVE or use heavy machinery until tomorrow (because of the sedation medicines used during the test).    FOLLOW UP: Our staff will call the number listed on your records the next business day following your procedure to check on you and address any questions or concerns that you may have regarding the information given to you following your procedure. If we do not reach you, we will leave a message.  However, if you are feeling well and you are not experiencing any problems, there is no need to return our call.  We will assume that you have returned to your regular daily activities without incident.  If any biopsies were taken you will be contacted by phone or by letter within the next 1-3 weeks.  Please call us at [phone] if you have not heard about the biopsies in 3 weeks.    SIGNATURES/CONFIDENTIALITY: You and/or your care partner have signed paperwork which will be entered into your electronic medical record.  These signatures attest to the fact that that the information above on your After Visit Summary has been reviewed and is understood.  Full responsibility of the confidentiality of this discharge information lies with you and/or your care-partner. 

## 2015-08-27 NOTE — Op Note (Signed)
[facility]  [address] [place], [address]   COLONOSCOPY PROCEDURE REPORT  PATIENT: Jeffrey [name], Jeffrey [name]  MR#: [id number] BIRTHDATE: 1951-05-20 , 64  yrs. old GENDER: male ENDOSCOPIST: Eustace Quail, MD REFERRED BY:.Direct Self PROCEDURE DATE:  08/27/2015 PROCEDURE:   Colonoscopy, screening First Screening Colonoscopy - Avg.  risk and is 50 yrs.  old or older - No.  Prior Negative Screening - Now for repeat screening. 10 or more years since last screening  History of Adenoma - Now for follow-up colonoscopy & has been > or = to 3 yrs.  N/A  Polyps removed today? No Recommend repeat exam, <10 yrs? No ASA CLASS:   Class II INDICATIONS:Screening for colonic neoplasia and Colorectal Neoplasm Risk Assessment for this procedure is average risk. . Patient reports a negative index colonoscopy at age 19 (no details) MEDICATIONS: Monitored anesthesia care and Propofol 250 mg IV  DESCRIPTION OF PROCEDURE:   After the risks benefits and alternatives of the procedure were thoroughly explained, informed consent was obtained.  The digital rectal exam revealed hemorrhoids.   The LB [id number]  endoscope was introduced through the anus and advanced to the cecum, which was identified by both the appendix and ileocecal valve. No adverse events experienced.   The quality of the prep was excellent.  (Suprep was used)  The instrument was then slowly withdrawn as the colon was fully examined. Estimated blood loss is zero unless otherwise noted in this procedure report.    COLON FINDINGS: A normal appearing cecum, ileocecal valve, and appendiceal orifice were identified.  The ascending, transverse, descending, sigmoid colon, and rectum appeared unremarkable. Retroflexed views revealed internal hemorrhoids. The time to cecum = 2.1 Withdrawal time = 7.9   The scope was withdrawn and the procedure completed. COMPLICATIONS: There were no immediate complications.  ENDOSCOPIC  IMPRESSION: Normal colonoscopy  RECOMMENDATIONS: Continue current colorectal screening recommendations for "routine risk" patients with a repeat colonoscopy in 10 years.  eSigned:  Eustace Quail, MD 08/27/2015 9:36 AM   cc: The Patient and Altamese Redmond.  Plotnikov, MD

## 2015-08-27 NOTE — Progress Notes (Signed)
Transferred to recovery room. A/O x3, pleased with MAC.  VSS.  Report to Celia, RN. 

## 2015-08-28 ENCOUNTER — Telehealth: Payer: Self-pay | Admitting: *Deleted

## 2015-08-28 NOTE — Telephone Encounter (Signed)
  Follow up Call-  Call back number 08/27/2015  Post procedure Call Back phone  # [phone]  Permission to leave phone message Yes     Patient questions:  Do you have a fever, pain , or abdominal swelling? No. Pain Score  0 *  Have you tolerated food without any problems? Yes.    Have you been able to return to your normal activities? Yes.    Do you have any questions about your discharge instructions: Diet   No. Medications  No. Follow up visit  No.  Do you have questions or concerns about your Care? No.  Actions: * If pain score is 4 or above: No action needed, pain <4.

## 2015-12-03 ENCOUNTER — Telehealth: Payer: Self-pay

## 2015-12-03 MED ORDER — TAMSULOSIN HCL 0.4 MG PO CAPS: 0.4000 mg | ORAL_CAPSULE | Freq: Every day | ORAL | Status: DC

## 2015-12-03 NOTE — Telephone Encounter (Signed)
Recd rx refill request for tamsulosin from [facility]----this rx denied, patient needs office visit---patient has scheduled well exam for 2/10 at 9am, i have sent 15 day supply to pharm---can get additional refill after office visit

## 2015-12-06 ENCOUNTER — Encounter: Payer: Self-pay | Admitting: Internal Medicine

## 2015-12-06 ENCOUNTER — Ambulatory Visit (INDEPENDENT_AMBULATORY_CARE_PROVIDER_SITE_OTHER): Payer: Medicare Other | Admitting: Internal Medicine

## 2015-12-06 VITALS — BP 132/80 | HR 59 | Temp 97.9°F | Ht 72.0 in | Wt 263.2 lb

## 2015-12-06 DIAGNOSIS — N400 Enlarged prostate without lower urinary tract symptoms: Secondary | ICD-10-CM

## 2015-12-06 DIAGNOSIS — M5010 Cervical disc disorder with radiculopathy, unspecified cervical region: Secondary | ICD-10-CM

## 2015-12-06 DIAGNOSIS — R972 Elevated prostate specific antigen [PSA]: Secondary | ICD-10-CM

## 2015-12-06 DIAGNOSIS — R609 Edema, unspecified: Secondary | ICD-10-CM | POA: Insufficient documentation

## 2015-12-06 DIAGNOSIS — N320 Bladder-neck obstruction: Secondary | ICD-10-CM | POA: Diagnosis not present

## 2015-12-06 DIAGNOSIS — Z0001 Encounter for general adult medical examination with abnormal findings: Secondary | ICD-10-CM

## 2015-12-06 DIAGNOSIS — E785 Hyperlipidemia, unspecified: Secondary | ICD-10-CM | POA: Diagnosis not present

## 2015-12-06 DIAGNOSIS — Z23 Encounter for immunization: Secondary | ICD-10-CM

## 2015-12-06 DIAGNOSIS — M545 Low back pain, unspecified: Secondary | ICD-10-CM

## 2015-12-06 DIAGNOSIS — Z0000 Encounter for general adult medical examination without abnormal findings: Secondary | ICD-10-CM

## 2015-12-06 MED ORDER — FINASTERIDE 5 MG PO TABS: 5.0000 mg | ORAL_TABLET | Freq: Every day | ORAL | Status: DC

## 2015-12-06 MED ORDER — OMEPRAZOLE 20 MG PO CPDR: 20.0000 mg | DELAYED_RELEASE_CAPSULE | Freq: Every day | ORAL | Status: DC

## 2015-12-06 MED ORDER — TAMSULOSIN HCL 0.4 MG PO CAPS: 0.4000 mg | ORAL_CAPSULE | Freq: Every day | ORAL | Status: DC

## 2015-12-06 MED ORDER — QUINAPRIL-HYDROCHLOROTHIAZIDE 20-12.5 MG PO TABS: 2.0000 | ORAL_TABLET | Freq: Every day | ORAL | Status: DC

## 2015-12-06 MED ORDER — CYCLOBENZAPRINE HCL 5 MG PO TABS: 5.0000 mg | ORAL_TABLET | Freq: Three times a day (TID) | ORAL | Status: DC | PRN

## 2015-12-06 NOTE — Progress Notes (Signed)
Subjective:  Patient ID: Jeffrey [name], male    DOB: 05/22/1951  Age: 65 y.o. MRN: [id number]  CC: No chief complaint on file.   HPI [name] presents for a well exam C/o R neck pain going down to R thumb x 6-8 wks Driving [id number] mi per week  Outpatient Prescriptions Prior to Visit  Medication Sig Dispense Refill  . aspirin 81 MG tablet Take 81 mg by mouth daily.      . cyclobenzaprine (FLEXERIL) 5 MG tablet Take 1 tablet (5 mg total) by mouth 3 (three) times daily as needed for muscle spasms. 30 tablet 1  . finasteride (PROSCAR) 5 MG tablet TAKE 1 TABLET (5 MG TOTAL) BY MOUTH DAILY. 90 tablet 2  . omeprazole (PRILOSEC) 20 MG capsule Take 1 capsule (20 mg total) by mouth daily. 90 capsule 3  . quinapril-hydrochlorothiazide (ACCURETIC) 20-12.5 MG per tablet Take 2 tablets by mouth daily. 180 tablet 3  . tamsulosin (FLOMAX) 0.4 MG CAPS capsule Take 1 capsule (0.4 mg total) by mouth daily. --patient has scheduled 2/10 appt with dr plotnikov 10 capsule 0  . Cholecalciferol 1000 UNITS tablet Take 1,000 Units by mouth daily.      Marland Kitchen glucosamine-chondroitin 500-400 MG tablet Take 2 tablets by mouth 1 day or 1 dose.    . tadalafil (CIALIS) 20 MG tablet Take 1 tablet (20 mg total) by mouth daily as needed for erectile dysfunction. (Patient not taking: Reported on 08/13/2015) 12 tablet 3  . traMADol (ULTRAM) 50 MG tablet Take 1 tablet (50 mg total) by mouth every 6 (six) hours as needed. (Patient not taking: Reported on 08/13/2015) 60 tablet 1  . vitamin B-12 (CYANOCOBALAMIN) 500 MCG tablet Take 500 mcg by mouth daily.    . vitamin C (ASCORBIC ACID) 500 MG tablet Take 500 mg by mouth.     No facility-administered medications prior to visit.    ROS Review of Systems  Constitutional: Negative for appetite change, fatigue and unexpected weight change.  HENT: Negative for congestion, nosebleeds, sneezing, sore throat and trouble swallowing.   Eyes: Negative for itching and visual disturbance.   Respiratory: Negative for cough.   Cardiovascular: Negative for chest pain, palpitations and leg swelling.  Gastrointestinal: Negative for nausea, diarrhea, blood in stool and abdominal distention.  Genitourinary: Negative for frequency and hematuria.  Musculoskeletal: Positive for neck pain and neck stiffness. Negative for back pain, joint swelling and gait problem.  Skin: Negative for rash.  Neurological: Negative for dizziness, tremors, speech difficulty and weakness.  Psychiatric/Behavioral: Negative for suicidal ideas, sleep disturbance, dysphoric mood and agitation. The patient is not nervous/anxious.     Objective:  BP 132/80 mmHg  Pulse 59  Temp(Src) 97.9 F (36.6 C) (Oral)  Ht 6' (1.829 m)  Wt 263 lb 4 oz (119.409 kg)  BMI 35.70 kg/m2  SpO2 96%  BP Readings from Last 3 Encounters:  12/06/15 132/80  08/27/15 113/80  07/04/15 150/90    Wt Readings from Last 3 Encounters:  12/06/15 263 lb 4 oz (119.409 kg)  08/27/15 259 lb (117.482 kg)  08/13/15 259 lb 3.2 oz (117.572 kg)    Physical Exam  Constitutional: He is oriented to person, place, and time. He appears well-developed. No distress.  NAD  HENT:  Mouth/Throat: Oropharynx is clear and moist.  Eyes: Conjunctivae are normal. Pupils are equal, round, and reactive to light.  Neck: Normal range of motion. No JVD present. No thyromegaly present.  Cardiovascular: Normal rate, regular rhythm, normal  heart sounds and intact distal pulses.  Exam reveals no gallop and no friction rub.   No murmur heard. Pulmonary/Chest: Effort normal and breath sounds normal. No respiratory distress. He has no wheezes. He has no rales. He exhibits no tenderness.  Abdominal: Soft. Bowel sounds are normal. He exhibits no distension and no mass. There is no tenderness. There is no rebound and no guarding.  Musculoskeletal: Normal range of motion. He exhibits tenderness. He exhibits no edema.  Lymphadenopathy:    He has no cervical adenopathy.   Neurological: He is alert and oriented to person, place, and time. He has normal reflexes. No cranial nerve deficit. He exhibits normal muscle tone. He displays a negative Romberg sign. Coordination and gait normal.  Skin: Skin is warm and dry. No rash noted.  Psychiatric: He has a normal mood and affect. His behavior is normal. Judgment and thought content normal.  R neck tender Obese LE trace edema B   Lab Results  Component Value Date   WBC 7.6 12/06/2013   HGB 15.7 12/06/2013   HCT 49.1 12/06/2013   PLT 323.0 12/06/2013   GLUCOSE 91 12/06/2013   CHOL 195 09/02/2012   TRIG 87.0 09/02/2012   HDL 43.20 09/02/2012   LDLDIRECT 151.8 05/15/2010   LDLCALC 134* 09/02/2012   ALT 16 12/06/2013   AST 17 12/06/2013   NA 141 12/06/2013   K 4.7 12/06/2013   CL 101 12/06/2013   CREATININE 0.9 12/06/2013   BUN 13 12/06/2013   CO2 34* 12/06/2013   TSH 2.18 12/06/2013   PSA 1.74 09/02/2012   INR 0.93 08/12/2010    Dg Chest 2 View  12/28/2014  CLINICAL DATA:  Cough, wheezing for 4 days EXAM: CHEST  2 VIEW COMPARISON:  08/12/2010 FINDINGS: Cardiomediastinal silhouette is stable. No acute infiltrate or pleural effusion. No pulmonary edema. Stable bilateral basilar atelectasis or scarring. Mild degenerative changes thoracic spine. Again noted dextroscoliosis lower thoracic spine. IMPRESSION: No active disease. Again noted dextroscoliosis of lower thoracic spine. Electronically Signed   By: Lahoma Crocker M.D.   On: 12/28/2014 16:32    Assessment & Plan:   Diagnoses and all orders for this visit:  Well adult exam -     Basic metabolic panel; Future -     CBC with Differential/Platelet; Future -     Hepatic function panel; Future -     Lipid panel; Future -     Hepatitis C antibody; Future -     PSA; Future -     TSH; Future -     Urinalysis; Future  Cervical disc disorder with radiculopathy of cervical region -     CBC with Differential/Platelet; Future  BPH (benign prostatic  hyperplasia) -     CBC with Differential/Platelet; Future -     Hepatitis C antibody; Future  PSA elevation -     CBC with Differential/Platelet; Future -     Hepatitis C antibody; Future  Right-sided low back pain without sciatica -     CBC with Differential/Platelet; Future  Dyslipidemia -     CBC with Differential/Platelet; Future -     Lipid panel; Future -     TSH; Future  Bladder neck obstruction -     CBC with Differential/Platelet; Future -     PSA; Future -     Urinalysis; Future  Need for prophylactic vaccination against Streptococcus pneumoniae (pneumococcus) -     Pneumococcal conjugate vaccine 13-valent  Other orders -  Discontinue: quinapril-hydrochlorothiazide (ACCURETIC) 20-12.5 MG tablet; Take 2 tablets by mouth daily. -     Discontinue: tamsulosin (FLOMAX) 0.4 MG CAPS capsule; Take 1 capsule (0.4 mg total) by mouth daily. -     Discontinue: finasteride (PROSCAR) 5 MG tablet; Take 1 tablet (5 mg total) by mouth daily. -     omeprazole (PRILOSEC) 20 MG capsule; Take 1 capsule (20 mg total) by mouth daily. -     quinapril-hydrochlorothiazide (ACCURETIC) 20-12.5 MG tablet; Take 2 tablets by mouth daily. -     finasteride (PROSCAR) 5 MG tablet; Take 1 tablet (5 mg total) by mouth daily. -     tamsulosin (FLOMAX) 0.4 MG CAPS capsule; Take 1 capsule (0.4 mg total) by mouth daily. -     cyclobenzaprine (FLEXERIL) 5 MG tablet; Take 1 tablet (5 mg total) by mouth 3 (three) times daily as needed for muscle spasms.   I have discontinued Mr. Schreyer Cholecalciferol, vitamin C, glucosamine-chondroitin, vitamin B-12, tadalafil, and traMADol. I am also having him maintain his aspirin, omeprazole, quinapril-hydrochlorothiazide, finasteride, tamsulosin, and cyclobenzaprine.  Meds ordered this encounter  Medications  . DISCONTD: quinapril-hydrochlorothiazide (ACCURETIC) 20-12.5 MG tablet    Sig: Take 2 tablets by mouth daily.    Dispense:  180 tablet    Refill:  3  .  DISCONTD: tamsulosin (FLOMAX) 0.4 MG CAPS capsule    Sig: Take 1 capsule (0.4 mg total) by mouth daily.    Dispense:  90 capsule    Refill:  0  . DISCONTD: finasteride (PROSCAR) 5 MG tablet    Sig: Take 1 tablet (5 mg total) by mouth daily.    Dispense:  90 tablet    Refill:  2  . omeprazole (PRILOSEC) 20 MG capsule    Sig: Take 1 capsule (20 mg total) by mouth daily.    Dispense:  90 capsule    Refill:  3  . quinapril-hydrochlorothiazide (ACCURETIC) 20-12.5 MG tablet    Sig: Take 2 tablets by mouth daily.    Dispense:  180 tablet    Refill:  3  . finasteride (PROSCAR) 5 MG tablet    Sig: Take 1 tablet (5 mg total) by mouth daily.    Dispense:  90 tablet    Refill:  2  . tamsulosin (FLOMAX) 0.4 MG CAPS capsule    Sig: Take 1 capsule (0.4 mg total) by mouth daily.    Dispense:  90 capsule    Refill:  0  . cyclobenzaprine (FLEXERIL) 5 MG tablet    Sig: Take 1 tablet (5 mg total) by mouth 3 (three) times daily as needed for muscle spasms.    Dispense:  30 tablet    Refill:  1     Follow-up: Return in about 6 months (around 06/04/2016) for a follow-up visit.  Walker Kehr, MD

## 2015-12-06 NOTE — Assessment & Plan Note (Signed)
Chronic Dr Diona Fanti q 12 mo Flomax, Finasteride qod

## 2015-12-06 NOTE — Progress Notes (Signed)
Pre visit review using our clinic review tool, if applicable. No additional management support is needed unless otherwise documented below in the visit note. 

## 2015-12-06 NOTE — Assessment & Plan Note (Signed)
11/2015 R Pt is seeing a chiropractor -- 50% better

## 2015-12-06 NOTE — Assessment & Plan Note (Signed)
Here for medicare wellness/physical  Diet: heart healthy  Physical activity: not sedentary  Depression/mood screen: negative  Hearing: a little decreased to whispered voice  Visual acuity: grossly normal, performs annual eye exam  ADLs: capable  Fall risk: none  Home safety: good  Cognitive evaluation: intact to orientation, naming, recall and repetition  EOL planning: adv directives, full code/ I agree  I have personally reviewed and have noted  1. The patient's medical, surgical and social history  2. Their use of alcohol, tobacco or illicit drugs  3. Their current medications and supplements  4. The patient's functional ability including ADL's, fall risks, home safety risks and hearing or visual impairment.  5. Diet and physical activities  6. Evidence for depression or mood disorders 7. The roster of all physicians providing medical care to patient - is listed in the Snapshot section of the chart and reviewed today.    Today patient counseled on age appropriate routine health concerns for screening and prevention, each reviewed and up to date or declined. Immunizations reviewed and up to date or declined. Labs ordered and reviewed. Risk factors for depression reviewed and negative. Hearing function and visual acuity are intact. ADLs screened and addressed as needed. Functional ability and level of safety reviewed and appropriate. Education, counseling and referrals performed based on assessed risks today. Patient provided with a copy of personalized plan for preventive services.  Colon nl 2016 dr Henrene Pastor

## 2015-12-06 NOTE — Patient Instructions (Signed)

## 2015-12-09 ENCOUNTER — Other Ambulatory Visit (INDEPENDENT_AMBULATORY_CARE_PROVIDER_SITE_OTHER): Payer: Medicare Other

## 2015-12-09 ENCOUNTER — Telehealth: Payer: Self-pay

## 2015-12-09 DIAGNOSIS — Z0000 Encounter for general adult medical examination without abnormal findings: Secondary | ICD-10-CM | POA: Diagnosis not present

## 2015-12-09 DIAGNOSIS — M545 Low back pain, unspecified: Secondary | ICD-10-CM

## 2015-12-09 DIAGNOSIS — E785 Hyperlipidemia, unspecified: Secondary | ICD-10-CM

## 2015-12-09 DIAGNOSIS — N320 Bladder-neck obstruction: Secondary | ICD-10-CM

## 2015-12-09 DIAGNOSIS — R972 Elevated prostate specific antigen [PSA]: Secondary | ICD-10-CM

## 2015-12-09 DIAGNOSIS — N400 Enlarged prostate without lower urinary tract symptoms: Secondary | ICD-10-CM

## 2015-12-09 DIAGNOSIS — M5010 Cervical disc disorder with radiculopathy, unspecified cervical region: Secondary | ICD-10-CM

## 2015-12-09 LAB — BASIC METABOLIC PANEL
BUN: 19 mg/dL (ref 6–23)
CHLORIDE: 102 meq/L (ref 96–112)
CO2: 37 meq/L — AB (ref 19–32)
CREATININE: 1 mg/dL (ref 0.40–1.50)
Calcium: 9.5 mg/dL (ref 8.4–10.5)
GFR: 79.49 mL/min (ref >60.00)
GLUCOSE: 105 mg/dL — AB (ref 70–99)
Potassium: 4.5 mEq/L (ref 3.5–5.1)
Sodium: 144 mEq/L (ref 135–145)

## 2015-12-09 LAB — HEPATIC FUNCTION PANEL
ALBUMIN: 3.7 g/dL (ref 3.5–5.2)
ALT: 13 U/L (ref 0–53)
AST: 16 U/L (ref 0–37)
Alkaline Phosphatase: 61 U/L (ref 39–117)
BILIRUBIN TOTAL: 0.7 mg/dL (ref 0.2–1.2)
Bilirubin, Direct: 0.1 mg/dL (ref 0.0–0.3)
TOTAL PROTEIN: 6.6 g/dL (ref 6.0–8.3)

## 2015-12-09 LAB — URINALYSIS
BILIRUBIN URINE: NEGATIVE
HGB URINE DIPSTICK: NEGATIVE
KETONES UR: NEGATIVE
LEUKOCYTES UA: NEGATIVE
NITRITE: NEGATIVE
PH: 6.5 (ref 5.0–8.0)
Specific Gravity, Urine: 1.02 (ref 1.000–1.030)
Total Protein, Urine: NEGATIVE
Urine Glucose: NEGATIVE
Urobilinogen, UA: 0.2 (ref 0.0–1.0)

## 2015-12-09 LAB — CBC WITH DIFFERENTIAL/PLATELET
BASOS PCT: 0.7 % (ref 0.0–3.0)
Basophils Absolute: 0.1 10*3/uL (ref 0.0–0.1)
EOS PCT: 2.6 % (ref 0.0–5.0)
Eosinophils Absolute: 0.2 10*3/uL (ref 0.0–0.7)
HEMATOCRIT: 46.4 % (ref 39.0–52.0)
HEMOGLOBIN: 15.3 g/dL (ref 13.0–17.0)
Lymphocytes Relative: 30.4 % (ref 12.0–46.0)
Lymphs Abs: 2.7 10*3/uL (ref 0.7–4.0)
MCHC: 33 g/dL (ref 30.0–36.0)
MCV: 98 fl (ref 78.0–100.0)
MONOS PCT: 11.3 % (ref 3.0–12.0)
Monocytes Absolute: 1 10*3/uL (ref 0.1–1.0)
Neutro Abs: 4.8 10*3/uL (ref 1.4–7.7)
Neutrophils Relative %: 55 % (ref 43.0–77.0)
Platelets: 306 10*3/uL (ref 150.0–400.0)
RBC: 4.74 Mil/uL (ref 4.22–5.81)
RDW: 14 % (ref 11.5–15.5)
WBC: 8.8 10*3/uL (ref 4.0–10.5)

## 2015-12-09 LAB — LIPID PANEL
CHOLESTEROL: 191 mg/dL (ref 0–200)
HDL: 49.2 mg/dL (ref >39.00)
LDL CALC: 124 mg/dL — AB (ref 0–99)
NonHDL: 141.49
TRIGLYCERIDES: 89 mg/dL (ref 0.0–149.0)
Total CHOL/HDL Ratio: 4
VLDL: 17.8 mg/dL (ref 0.0–40.0)

## 2015-12-09 LAB — PSA: PSA: 1.4 ng/mL (ref 0.10–4.00)

## 2015-12-09 LAB — TSH: TSH: 2.08 u[IU]/mL (ref 0.35–4.50)

## 2015-12-09 NOTE — Telephone Encounter (Signed)
PA initiated for [place], [place]

## 2015-12-09 NOTE — Telephone Encounter (Signed)
PA approved.

## 2015-12-09 NOTE — Telephone Encounter (Signed)
PA approved 12/09/2015 - 12/08/2016

## 2015-12-10 LAB — HEPATITIS C ANTIBODY: HCV AB: NEGATIVE

## 2016-03-03 ENCOUNTER — Other Ambulatory Visit: Payer: Self-pay | Admitting: Internal Medicine

## 2016-03-05 MED ORDER — TAMSULOSIN HCL 0.4 MG PO CAPS: 0.4000 mg | ORAL_CAPSULE | Freq: Every day | ORAL | Status: DC

## 2016-03-05 NOTE — Addendum Note (Signed)
Addended by: Karle Barr on: 03/05/2016 05:25 PM   Modules accepted: Orders

## 2016-03-18 ENCOUNTER — Ambulatory Visit (INDEPENDENT_AMBULATORY_CARE_PROVIDER_SITE_OTHER): Payer: Medicare Other | Admitting: Internal Medicine

## 2016-03-18 ENCOUNTER — Encounter: Payer: Self-pay | Admitting: Internal Medicine

## 2016-03-18 VITALS — BP 130/70 | HR 69 | Resp 20 | Wt 267.0 lb

## 2016-03-18 DIAGNOSIS — R109 Unspecified abdominal pain: Secondary | ICD-10-CM

## 2016-03-18 NOTE — Patient Instructions (Signed)
Please continue all other medications as before, and refills have been done if requested.  Please have the pharmacy call with any other refills you may need.  Please continue your efforts at being more active, low cholesterol diet, and weight control.  Please keep your appointments with your specialists as you may have planned     

## 2016-03-18 NOTE — Assessment & Plan Note (Signed)
Afeb, s/p splenectomy, with some rib tender on left side in the midaxillary line, but no rash or other abnormal, ok to follow for now

## 2016-03-18 NOTE — Progress Notes (Signed)
Pre visit review using our clinic review tool, if applicable. No additional management support is needed unless otherwise documented below in the visit note. 

## 2016-03-18 NOTE — Progress Notes (Signed)
   Subjective:    Patient ID: Jeffrey [name], male    DOB: [birth year], 65 y.o.   MRN: [id number]  HPI here thinking he might have a rash since he has some discomfort and itching to left lateral chest wall.  Thinks me may have a rash, but wife couldn't see one.  Pt denies fever, wt loss, night sweats, loss of appetite, or other constitutional symptoms  Denies worsening reflux, other abd pain, dysphagia, n/v, bowel change or blood. Past Medical History  Diagnosis Date  . Anxiety   . Depression   . GERD (gastroesophageal reflux disease)   . Hypertension   . BPH (benign prostatic hypertrophy)   . Meralgia paresthetica of right side   . Arthritis     knees  . Blood transfusion without reported diagnosis   . Cataract     "beginnings of"  . Chronic kidney disease     kidney infection   Past Surgical History  Procedure Laterality Date  . Hernia repair      inguinal, umbilical  . Splenectomy      reports that he has never smoked. He has never used smokeless tobacco. He reports that he drinks alcohol. He reports that he does not use illicit drugs. family history includes Heart disease (age of onset: 51) in his mother; Hypertension in his other. There is no history of Colon cancer, Esophageal cancer, Stomach cancer, or Rectal cancer. Allergies  Allergen Reactions  . Bupropion Hcl     Unsure of reaction  . Lovastatin     REACTION: myalgia  . Venlafaxine     Unsure of reaction   Current Outpatient Prescriptions on File Prior to Visit  Medication Sig Dispense Refill  . aspirin 81 MG tablet Take 81 mg by mouth daily.      . cyclobenzaprine (FLEXERIL) 5 MG tablet Take 1 tablet (5 mg total) by mouth 3 (three) times daily as needed for muscle spasms. 30 tablet 1  . finasteride (PROSCAR) 5 MG tablet Take 1 tablet (5 mg total) by mouth daily. 90 tablet 2  . omeprazole (PRILOSEC) 20 MG capsule Take 1 capsule (20 mg total) by mouth daily. 90 capsule 3  . quinapril-hydrochlorothiazide (ACCURETIC)  20-12.5 MG tablet Take 2 tablets by mouth daily. 180 tablet 3  . tamsulosin (FLOMAX) 0.4 MG CAPS capsule Take 1 capsule (0.4 mg total) by mouth daily. 90 capsule 1   No current facility-administered medications on file prior to visit.   Review of Systems All otherwise neg per pt     Objective:   Physical Exam BP 130/70 mmHg  Pulse 69  Resp 20  Wt 267 lb (121.11 kg)  SpO2 95% VS noted,  Constitutional: Pt appears in no apparent distress HENT: Head: NCAT.  Right Ear: External ear normal.  Left Ear: External ear normal.  Eyes: . Pupils are equal, round, and reactive to light. Conjunctivae and EOM are normal Neck: Normal range of motion. Neck supple.  Cardiovascular: Normal rate and regular rhythm.   Pulmonary/Chest: Effort normal and breath sounds without rales or wheezing.  Abd:  Soft, NT, ND, + BS Has some mild discomfort to palpation to left rib cage about t10, no swelling, bruising or overlying skin change Neurological: Pt is alert. Not confused , motor grossly intact Skin: Skin is warm. No rash, no LE edema Psychiatric: Pt behavior is normal. No agitation.      Assessment & Plan:

## 2016-07-24 ENCOUNTER — Telehealth: Payer: Self-pay | Admitting: *Deleted

## 2016-07-24 MED ORDER — SILDENAFIL CITRATE 20 MG PO TABS: 20.0000 mg | ORAL_TABLET | Freq: Every day | ORAL | 3 refills | Status: DC | PRN

## 2016-07-24 NOTE — Telephone Encounter (Signed)
Rec'd call pt states he is wanting MD to rx something generic for ED.MD is out of office will hold until he return on Monday w/his response...Jeffrey [name]

## 2016-07-24 NOTE — Telephone Encounter (Signed)
Ok done Thx 

## 2016-07-27 ENCOUNTER — Telehealth: Payer: Self-pay

## 2016-07-27 NOTE — Telephone Encounter (Signed)
Noted He will have to pay for it with his own money Thx

## 2016-07-27 NOTE — Telephone Encounter (Signed)
Notified pt w/MD response.../lmb 

## 2016-07-27 NOTE — Telephone Encounter (Signed)
PA advised and understood

## 2016-07-27 NOTE — Telephone Encounter (Signed)
Sildenafil PA DENIED< please advise. Thanks!

## 2016-07-27 NOTE — Telephone Encounter (Signed)
PA initiated via CoverMyMeds key [id number]

## 2016-08-31 ENCOUNTER — Other Ambulatory Visit: Payer: Self-pay | Admitting: *Deleted

## 2016-08-31 MED ORDER — TAMSULOSIN HCL 0.4 MG PO CAPS: 0.4000 mg | ORAL_CAPSULE | Freq: Every day | ORAL | 1 refills | Status: DC

## 2016-09-04 DIAGNOSIS — Z23 Encounter for immunization: Secondary | ICD-10-CM | POA: Diagnosis not present

## 2016-12-28 ENCOUNTER — Other Ambulatory Visit: Payer: Self-pay | Admitting: *Deleted

## 2016-12-28 MED ORDER — FINASTERIDE 5 MG PO TABS: 5.0000 mg | ORAL_TABLET | Freq: Every day | ORAL | 0 refills | Status: DC

## 2017-03-09 NOTE — Progress Notes (Addendum)
Subjective:   Jeffrey [name] is a 66 y.o. male who presents for Medicare Annual/Subsequent preventive examination.  Review of Systems:  No ROS.  Medicare Wellness Visit.  Cardiac Risk Factors include: advanced age (>55men, >74 women);dyslipidemia;hypertension;male gender Sleep patterns: feels rested on waking, gets up 1-2 times nightly to void and sleeps 6-8 hours nightly.   Home Safety/Smoke Alarms: Feels safe in home. Smoke alarms in place.   Living environment; residence and Firearm Safety: 2-story house, no firearms. Lives with wife, no needs for DME Seat Belt Safety/Bike Helmet: Wears seat belt.   Counseling:   Eye Exam- appointment yearly Dental- appointment every 6 months  Male:   CCS- Last 08/27/15, normal, recall 10 years PSA-  Lab Results  Component Value Date   PSA 1.40 12/09/2015   PSA 1.74 09/02/2012   PSA 6.28 (H) 06/19/2011       Objective:    Vitals: BP (!) 142/82   Pulse 60   Resp 20   Ht 6' (1.829 m)   Wt 247 lb (112 kg)   SpO2 97%   BMI 33.50 kg/m   Body mass index is 33.5 kg/m.  Tobacco History  Smoking Status  . Never Smoker  Smokeless Tobacco  . Never Used     Counseling given: Not Answered   Past Medical History:  Diagnosis Date  . Anxiety   . Arthritis    knees  . Blood transfusion without reported diagnosis   . BPH (benign prostatic hypertrophy)   . Cataract    "beginnings of"  . Chronic kidney disease    kidney infection  . Depression   . GERD (gastroesophageal reflux disease)   . Hypertension   . Meralgia paresthetica of right side    Past Surgical History:  Procedure Laterality Date  . HERNIA REPAIR     inguinal, umbilical  . SPLENECTOMY     Family History  Problem Relation Age of Onset  . Heart disease Mother 37       chf  . Hypertension Other   . Colon cancer Neg Hx   . Esophageal cancer Neg Hx   . Stomach cancer Neg Hx   . Rectal cancer Neg Hx    History  Sexual Activity  . Sexual activity: Yes     Outpatient Encounter Prescriptions as of 03/10/2017  Medication Sig  . Ascorbic Acid (VITAMIN C) 100 MG tablet Take 1 tablet by mouth daily.  Marland Kitchen aspirin 81 MG tablet Take 81 mg by mouth daily.    . Cholecalciferol (VITAMIN D3) 2000 units TABS Take 1 tablet by mouth.  . finasteride (PROSCAR) 5 MG tablet Take 1 tablet (5 mg total) by mouth daily. Overdue for yearly physical w/labs must see MF for refills  . quinapril-hydrochlorothiazide (ACCURETIC) 20-12.5 MG tablet Take 2 tablets by mouth daily.  . sildenafil (REVATIO) 20 MG tablet Take 1-5 tablets (20-100 mg total) by mouth daily as needed. For ED  . tamsulosin (FLOMAX) 0.4 MG CAPS capsule Take 1 capsule (0.4 mg total) by mouth daily.  . [DISCONTINUED] cyclobenzaprine (FLEXERIL) 5 MG tablet Take 1 tablet (5 mg total) by mouth 3 (three) times daily as needed for muscle spasms. (Patient not taking: Reported on 03/10/2017)  . [DISCONTINUED] omeprazole (PRILOSEC) 20 MG capsule Take 1 capsule (20 mg total) by mouth daily. (Patient not taking: Reported on 03/10/2017)   No facility-administered encounter medications on file as of 03/10/2017.     Activities of Daily Living In your present state of health,  do you have any difficulty performing the following activities: 03/10/2017  Hearing? N  Vision? N  Difficulty concentrating or making decisions? N  Walking or climbing stairs? N  Dressing or bathing? N  Doing errands, shopping? N  Preparing Food and eating ? N  Using the Toilet? N  In the past six months, have you accidently leaked urine? N  Do you have problems with loss of bowel control? N  Managing your Medications? N  Managing your Finances? N  Housekeeping or managing your Housekeeping? N  Some recent data might be hidden    Patient Care Team: Plotnikov, Evie Lacks, MD as PCP - General Franchot Gallo, MD as Attending Physician (Urology)   Assessment:    Physical assessment deferred to PCP.  Exercise Activities and Dietary  recommendations Current Exercise Habits: Structured exercise class, Type of exercise: walking;treadmill, Time (Minutes): 45, Frequency (Times/Week): 3, Weekly Exercise (Minutes/Week): 135, Intensity: Mild  Diet (meal preparation, eat out, water intake, caffeinated beverages, dairy products, fruits and vegetables): in general, a "healthy" diet  , well balanced, low fat/ cholesterol, low salt eats a variety of fruits and vegetables daily, limits salt, fat/cholesterol, sugar, caffeine, drinks 6-8 glasses of water daily.       Goals    . lose weigh           Continue to exercise and eat healthy, enjoy life, family, and church      Fall Risk Fall Risk  03/10/2017 12/06/2015  Falls in the past year? No No   Depression Screen PHQ 2/9 Scores 03/10/2017 03/10/2017 12/06/2015  PHQ - 2 Score 0 0 0  PHQ- 9 Score 0 - -    Cognitive Function       Ad8 score reviewed for issues:  Issues making decisions: no  Less interest in hobbies / activities: no  Repeats questions, stories (family complaining): no  Trouble using ordinary gadgets (microwave, computer, phone): no  Forgets the month or year: no  Mismanaging finances: no  Remembering appts: no  Daily problems with thinking and/or memory: no Ad8 score is= 0     Immunization History  Administered Date(s) Administered  . Influenza Whole 10/16/2002, 09/05/2012  . Influenza,inj,Quad PF,36+ Mos 09/04/2016  . Influenza-Unspecified 09/05/2013, 09/16/2015  . Meningococcal Polysaccharide 05/23/2010  . Pneumococcal Conjugate-13 12/06/2015  . Pneumococcal Polysaccharide-23 05/23/2010  . Tdap 12/06/2013   Screening Tests Health Maintenance  Topic Date Due  . PNA vac Low Risk Adult (2 of 2 - PPSV23) 03/26/2017 (Originally 12/05/2016)  . INFLUENZA VACCINE  05/26/2017  . TETANUS/TDAP  12/07/2023  . COLONOSCOPY  08/26/2025  . Hepatitis C Screening  Completed      Plan:    Continue to eat heart healthy diet (full of fruits,  vegetables, whole grains, lean protein, water--limit salt, fat, and sugar intake) and increase physical activity as tolerated.  Continue doing brain stimulating activities (puzzles, reading, adult coloring books, staying active) to keep memory sharp.   I have personally reviewed and noted the following in the patient's chart:   . Medical and social history . Use of alcohol, tobacco or illicit drugs  . Current medications and supplements . Functional ability and status . Nutritional status . Physical activity . Advanced directives . List of other physicians . Vitals . Screenings to include cognitive, depression, and falls . Referrals and appointments  In addition, I have reviewed and discussed with patient certain preventive protocols, quality metrics, and best practice recommendations. A written personalized care plan for preventive services  as well as general preventive health recommendations were provided to patient.     Michiel Cowboy, RN  03/10/2017  Medical screening examination/treatment/procedure(s) were performed by non-physician practitioner and as supervising physician I was immediately available for consultation/collaboration. I agree with above. Walker Kehr, MD

## 2017-03-09 NOTE — Progress Notes (Signed)
Pre visit review using our clinic review tool, if applicable. No additional management support is needed unless otherwise documented below in the visit note. 

## 2017-03-10 ENCOUNTER — Telehealth: Payer: Self-pay | Admitting: *Deleted

## 2017-03-10 ENCOUNTER — Ambulatory Visit (INDEPENDENT_AMBULATORY_CARE_PROVIDER_SITE_OTHER): Payer: Medicare Other | Admitting: *Deleted

## 2017-03-10 VITALS — BP 142/82 | HR 60 | Resp 20 | Ht 72.0 in | Wt 247.0 lb

## 2017-03-10 DIAGNOSIS — Z0000 Encounter for general adult medical examination without abnormal findings: Secondary | ICD-10-CM | POA: Diagnosis not present

## 2017-03-10 NOTE — Telephone Encounter (Signed)
At [facility] patient stated that he needed refills for his medications. Adding that typically his visit is with PCP who refills them. He has about a week left before he will run out of his prescribed medicine.

## 2017-03-10 NOTE — Patient Instructions (Addendum)
Continue to eat heart healthy diet (full of fruits, vegetables, whole grains, lean protein, water--limit salt, fat, and sugar intake) and increase physical activity as tolerated.  Continue doing brain stimulating activities (puzzles, reading, adult coloring books, staying active) to keep memory sharp.    Jeffrey [name] , Thank you for taking time to come for your Medicare Wellness Visit. I appreciate your ongoing commitment to your health goals. Please review the following plan we discussed and let me know if I can assist you in the future.   These are the goals we discussed: Goals    . lose weigh           Continue to exercise and eat healthy, enjoy life, family, and church       This is a list of the screening recommended for you and due dates:  Health Maintenance  Topic Date Due  . Pneumonia vaccines (2 of 2 - PPSV23) 12/05/2016  . Flu Shot  05/26/2017  . Tetanus Vaccine  12/07/2023  . Colon Cancer Screening  08/26/2025  .  Hepatitis C: One time screening is recommended by Center for Disease Control  (CDC) for  adults born from [birth year] through 1966.   Completed

## 2017-03-11 NOTE — Telephone Encounter (Signed)
Ok to renew what he needs Thx

## 2017-03-12 MED ORDER — QUINAPRIL-HYDROCHLOROTHIAZIDE 20-12.5 MG PO TABS: 2.0000 | ORAL_TABLET | Freq: Every day | ORAL | 3 refills | Status: DC

## 2017-03-12 MED ORDER — SILDENAFIL CITRATE 20 MG PO TABS: 20.0000 mg | ORAL_TABLET | Freq: Every day | ORAL | 3 refills | Status: DC | PRN

## 2017-03-12 MED ORDER — TAMSULOSIN HCL 0.4 MG PO CAPS: 0.4000 mg | ORAL_CAPSULE | Freq: Every day | ORAL | 1 refills | Status: DC

## 2017-03-12 MED ORDER — FINASTERIDE 5 MG PO TABS: 5.0000 mg | ORAL_TABLET | Freq: Every day | ORAL | 0 refills | Status: DC

## 2017-03-12 NOTE — Telephone Encounter (Signed)
Completed, thank you!

## 2017-03-12 NOTE — Telephone Encounter (Signed)
Called and informed patient that his prescription medications have been refilled.

## 2017-07-26 DIAGNOSIS — N401 Enlarged prostate with lower urinary tract symptoms: Secondary | ICD-10-CM | POA: Diagnosis not present

## 2017-07-26 DIAGNOSIS — R972 Elevated prostate specific antigen [PSA]: Secondary | ICD-10-CM | POA: Diagnosis not present

## 2017-07-26 DIAGNOSIS — R3911 Hesitancy of micturition: Secondary | ICD-10-CM | POA: Diagnosis not present

## 2017-09-02 ENCOUNTER — Telehealth: Payer: Self-pay | Admitting: Internal Medicine

## 2017-09-02 MED ORDER — TAMSULOSIN HCL 0.4 MG PO CAPS: 0.4000 mg | ORAL_CAPSULE | Freq: Every day | ORAL | 0 refills | Status: DC

## 2017-09-02 NOTE — Telephone Encounter (Signed)
Patient is scheduled for 11/14 at 9:45am.  Patient is taking last pill today.  Can we send in enough pills to get patient through until 11/14?

## 2017-09-02 NOTE — Telephone Encounter (Signed)
Patient is requesting refill on tamsulosin,.  Patient is currently out of this medication b/c he was waiting on pharmacy to request from our office.  Patient uses [facility] on [address].  Patient requesting to be sent as soon as possible.

## 2017-09-02 NOTE — Telephone Encounter (Signed)
Pt is overdue for an annual appt last saw MD back 11/2015. Pls contact pt to make appt for refills...Johny Chess

## 2017-09-08 ENCOUNTER — Ambulatory Visit (INDEPENDENT_AMBULATORY_CARE_PROVIDER_SITE_OTHER): Payer: Medicare Other | Admitting: Internal Medicine

## 2017-09-08 ENCOUNTER — Other Ambulatory Visit (INDEPENDENT_AMBULATORY_CARE_PROVIDER_SITE_OTHER): Payer: Medicare Other

## 2017-09-08 ENCOUNTER — Ambulatory Visit (INDEPENDENT_AMBULATORY_CARE_PROVIDER_SITE_OTHER)
Admission: RE | Admit: 2017-09-08 | Discharge: 2017-09-08 | Disposition: A | Discharge status: Home / Self Care | Payer: Medicare Other | Source: Ambulatory Visit | Attending: Internal Medicine | Admitting: Internal Medicine

## 2017-09-08 ENCOUNTER — Encounter: Payer: Self-pay | Admitting: Internal Medicine

## 2017-09-08 VITALS — BP 142/78 | HR 65 | Temp 98.2°F | Ht 72.0 in | Wt 250.0 lb

## 2017-09-08 DIAGNOSIS — M545 Low back pain, unspecified: Secondary | ICD-10-CM

## 2017-09-08 DIAGNOSIS — I10 Essential (primary) hypertension: Secondary | ICD-10-CM

## 2017-09-08 DIAGNOSIS — Z23 Encounter for immunization: Secondary | ICD-10-CM | POA: Diagnosis not present

## 2017-09-08 DIAGNOSIS — J9811 Atelectasis: Secondary | ICD-10-CM | POA: Diagnosis not present

## 2017-09-08 DIAGNOSIS — N400 Enlarged prostate without lower urinary tract symptoms: Secondary | ICD-10-CM | POA: Diagnosis not present

## 2017-09-08 LAB — URINALYSIS
Bilirubin Urine: NEGATIVE
Hgb urine dipstick: NEGATIVE
Ketones, ur: NEGATIVE
Leukocytes, UA: NEGATIVE
NITRITE: NEGATIVE
PH: 8 (ref 5.0–8.0)
Specific Gravity, Urine: 1.02 (ref 1.000–1.030)
TOTAL PROTEIN, URINE-UPE24: NEGATIVE
Urine Glucose: NEGATIVE
Urobilinogen, UA: 0.2 (ref 0.0–1.0)

## 2017-09-08 MED ORDER — FINASTERIDE 5 MG PO TABS: 5.0000 mg | ORAL_TABLET | Freq: Every day | ORAL | 3 refills | Status: DC

## 2017-09-08 MED ORDER — CYCLOBENZAPRINE HCL 5 MG PO TABS: 5.0000 mg | ORAL_TABLET | Freq: Three times a day (TID) | ORAL | 1 refills | Status: DC | PRN

## 2017-09-08 MED ORDER — TAMSULOSIN HCL 0.4 MG PO CAPS: 0.4000 mg | ORAL_CAPSULE | Freq: Every day | ORAL | 3 refills | Status: DC

## 2017-09-08 MED ORDER — QUINAPRIL-HYDROCHLOROTHIAZIDE 20-12.5 MG PO TABS: 2.0000 | ORAL_TABLET | Freq: Every day | ORAL | 3 refills | Status: DC

## 2017-09-08 NOTE — Assessment & Plan Note (Signed)
Accuretic po 

## 2017-09-08 NOTE — Assessment & Plan Note (Signed)
L flank pain - likely MSK CXR UA Flexeril. Aleve

## 2017-09-08 NOTE — Progress Notes (Signed)
Subjective:  Patient ID: [name], male    DOB: 05-18-1951  Age: 66 y.o. MRN: [id number]  CC: Follow-up (Medication ) and Back Pain (Left flank back pain-ongoing for one week. Denies injury.)   HPI Jusiah ENDRIT GITTINS presents for L LBP for >1 week - not better; worse w/laying down. Mild to moderate. F/u BPH, HTN. Lost wt on diet  Outpatient Medications Prior to Visit  Medication Sig Dispense Refill  . Ascorbic Acid (VITAMIN C) 100 MG tablet Take 1 tablet by mouth daily.    Marland Kitchen aspirin 81 MG tablet Take 81 mg by mouth daily.      . Cholecalciferol (VITAMIN D3) 2000 units TABS Take 1 tablet by mouth.    . finasteride (PROSCAR) 5 MG tablet Take 1 tablet (5 mg total) by mouth daily. Overdue for yearly physical w/labs must see MF for refills 30 tablet 0  . quinapril-hydrochlorothiazide (ACCURETIC) 20-12.5 MG tablet Take 2 tablets by mouth daily. 180 tablet 3  . sildenafil (REVATIO) 20 MG tablet Take 1-5 tablets (20-100 mg total) by mouth daily as needed. For ED 60 tablet 3  . tamsulosin (FLOMAX) 0.4 MG CAPS capsule Take 1 capsule (0.4 mg total) daily by mouth. Must keep schedule appt for future refills 30 capsule 0   No facility-administered medications prior to visit.     ROS Review of Systems  Constitutional: Negative for appetite change, fatigue and unexpected weight change.  HENT: Negative for congestion, nosebleeds, sneezing, sore throat and trouble swallowing.   Eyes: Negative for itching and visual disturbance.  Respiratory: Negative for cough, shortness of breath and wheezing.   Cardiovascular: Negative for chest pain, palpitations and leg swelling.  Gastrointestinal: Negative for abdominal distention, blood in stool, diarrhea and nausea.  Genitourinary: Positive for flank pain. Negative for dysuria, frequency, hematuria and urgency.  Musculoskeletal: Positive for back pain. Negative for gait problem, joint swelling and neck pain.  Skin: Negative for rash.  Neurological:  Negative for dizziness, tremors, speech difficulty and weakness.  Psychiatric/Behavioral: Negative for agitation, dysphoric mood, sleep disturbance and suicidal ideas. The patient is not nervous/anxious.     Objective:  BP (!) 142/78 (BP Location: Left Arm, Patient Position: Sitting, Cuff Size: Normal)   Pulse 65   Temp 98.2 F (36.8 C) (Oral)   Ht 6' (1.829 m)   Wt 250 lb (113.4 kg)   SpO2 98%   BMI 33.91 kg/m   BP Readings from Last 3 Encounters:  09/08/17 (!) 142/78  03/10/17 (!) 142/82  03/18/16 130/70    Wt Readings from Last 3 Encounters:  09/08/17 250 lb (113.4 kg)  03/10/17 247 lb (112 kg)  03/18/16 267 lb (121.1 kg)    Physical Exam  Constitutional: He is oriented to person, place, and time. He appears well-developed. No distress.  NAD  HENT:  Mouth/Throat: Oropharynx is clear and moist.  Eyes: Conjunctivae are normal. Pupils are equal, round, and reactive to light.  Neck: Normal range of motion. No JVD present. No thyromegaly present.  Cardiovascular: Normal rate, regular rhythm, normal heart sounds and intact distal pulses. Exam reveals no gallop and no friction rub.  No murmur heard. Pulmonary/Chest: Effort normal and breath sounds normal. No respiratory distress. He has no wheezes. He has no rales. He exhibits no tenderness.  Abdominal: Soft. Bowel sounds are normal. He exhibits no distension and no mass. There is no tenderness. There is no rebound and no guarding.  Musculoskeletal: Normal range of motion. He exhibits tenderness. He exhibits  no edema.  Lymphadenopathy:    He has no cervical adenopathy.  Neurological: He is alert and oriented to person, place, and time. He has normal reflexes. No cranial nerve deficit. He exhibits normal muscle tone. He displays a negative Romberg sign. Coordination and gait normal.  Skin: Skin is warm and dry. No rash noted.  Psychiatric: He has a normal mood and affect. His behavior is normal. Judgment and thought content  normal.  L flank is sensitive No rash Str leg elev is (-) B  Lab Results  Component Value Date   WBC 8.8 12/09/2015   HGB 15.3 12/09/2015   HCT 46.4 12/09/2015   PLT 306.0 12/09/2015   GLUCOSE 105 (H) 12/09/2015   CHOL 191 12/09/2015   TRIG 89.0 12/09/2015   HDL 49.20 12/09/2015   LDLDIRECT 151.8 05/15/2010   LDLCALC 124 (H) 12/09/2015   ALT 13 12/09/2015   AST 16 12/09/2015   NA 144 12/09/2015   K 4.5 12/09/2015   CL 102 12/09/2015   CREATININE 1.00 12/09/2015   BUN 19 12/09/2015   CO2 37 (H) 12/09/2015   TSH 2.08 12/09/2015   PSA 1.40 12/09/2015   INR 0.93 08/12/2010    Dg Chest 2 View  Result Date: 12/28/2014 CLINICAL DATA:  Cough, wheezing for 4 days EXAM: CHEST  2 VIEW COMPARISON:  08/12/2010 FINDINGS: Cardiomediastinal silhouette is stable. No acute infiltrate or pleural effusion. No pulmonary edema. Stable bilateral basilar atelectasis or scarring. Mild degenerative changes thoracic spine. Again noted dextroscoliosis lower thoracic spine. IMPRESSION: No active disease. Again noted dextroscoliosis of lower thoracic spine. Electronically Signed   By: Lahoma Crocker M.D.   On: 12/28/2014 16:32    Assessment & Plan:   There are no diagnoses linked to this encounter. I am having Mukesh M. Harriger maintain his aspirin, Vitamin D3, vitamin C, finasteride, quinapril-hydrochlorothiazide, sildenafil, and tamsulosin.  No orders of the defined types were placed in this encounter.    Follow-up: No Follow-up on file.  Walker Kehr, MD

## 2017-09-08 NOTE — Assessment & Plan Note (Signed)
Flomax, Finasteride qod 

## 2017-09-15 ENCOUNTER — Telehealth: Payer: Self-pay | Admitting: Internal Medicine

## 2017-09-15 NOTE — Telephone Encounter (Signed)
Pt called wanting to speak with you regarding his labs results. I gave him MD response but he had a few questions that he would like to talk to you.

## 2017-09-23 NOTE — Telephone Encounter (Signed)
Pt notified of lab results

## 2017-12-24 IMAGING — DX DG CHEST 2V
2 series · 2 of 2 positions shown · non-contrast
Comparison: Chest x-ray of December 28, 2014

CLINICAL DATA: Left posterior chest discomfort for the past 7 days.
History of pneumonia, hypertension, never smoked.

EXAM:
CHEST  2 VIEW

[chest pa]
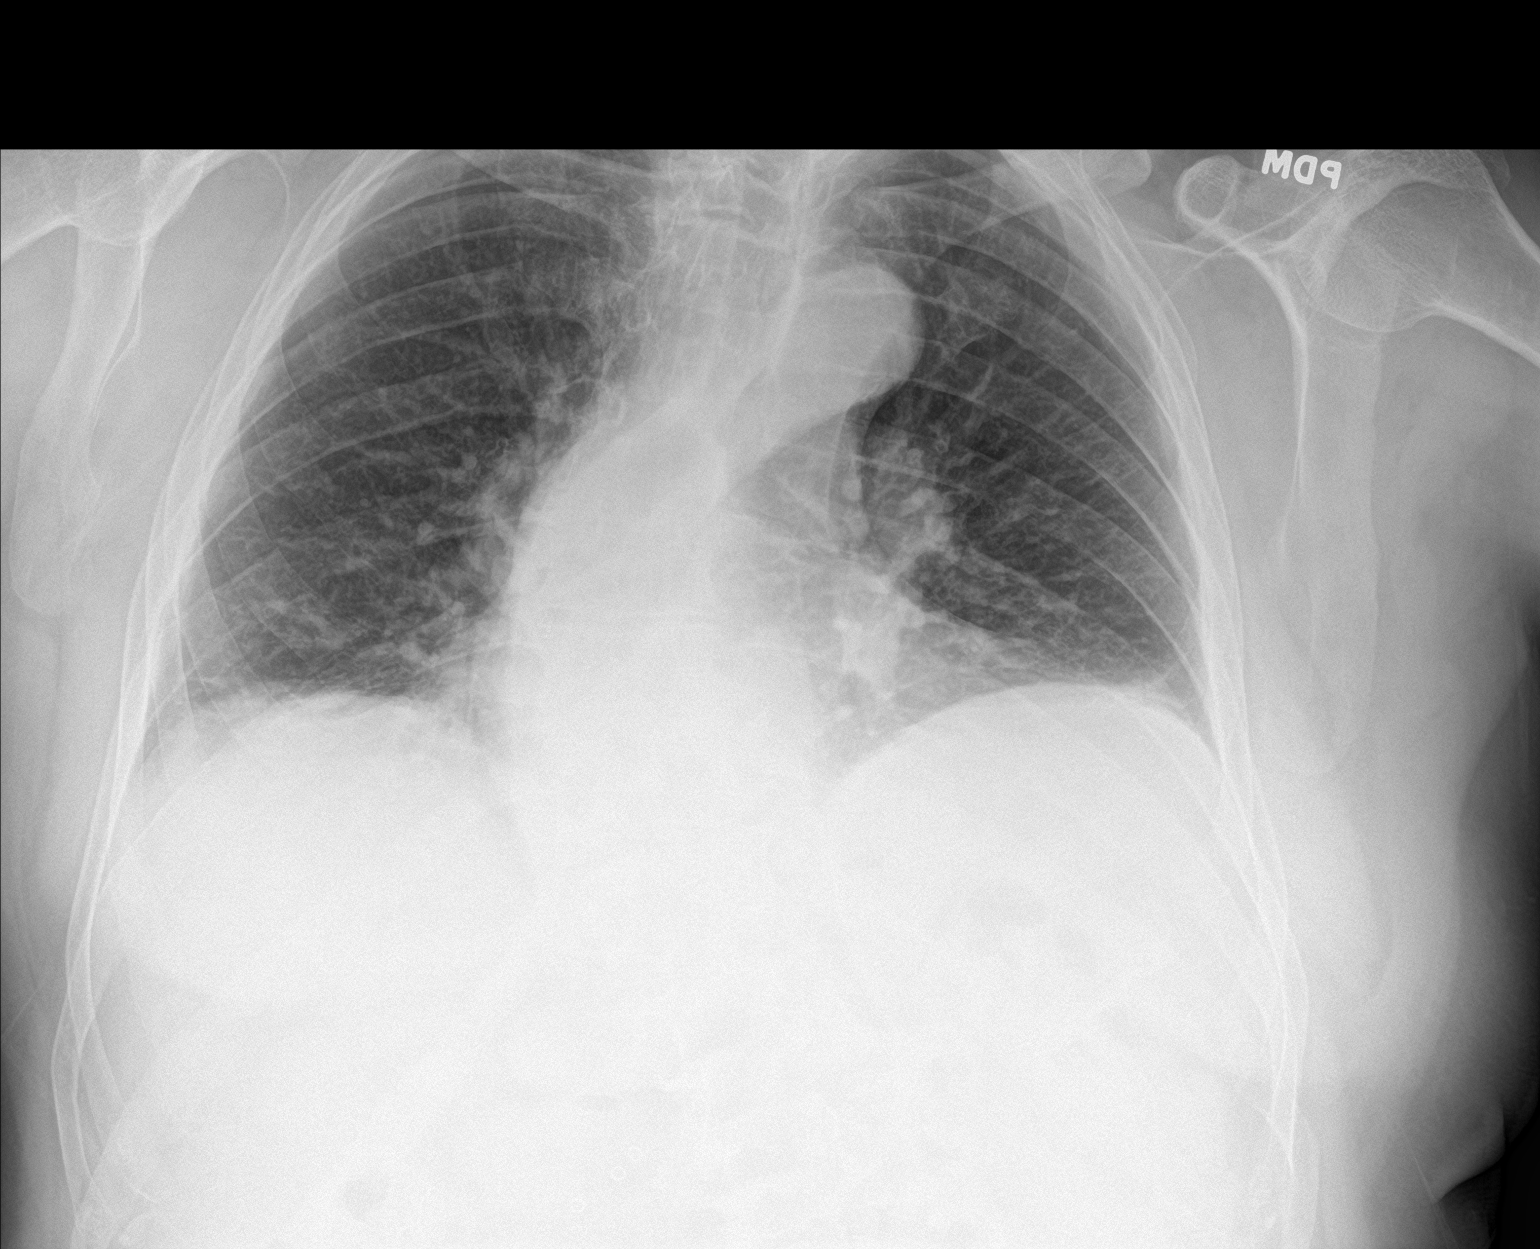

[chest lat]
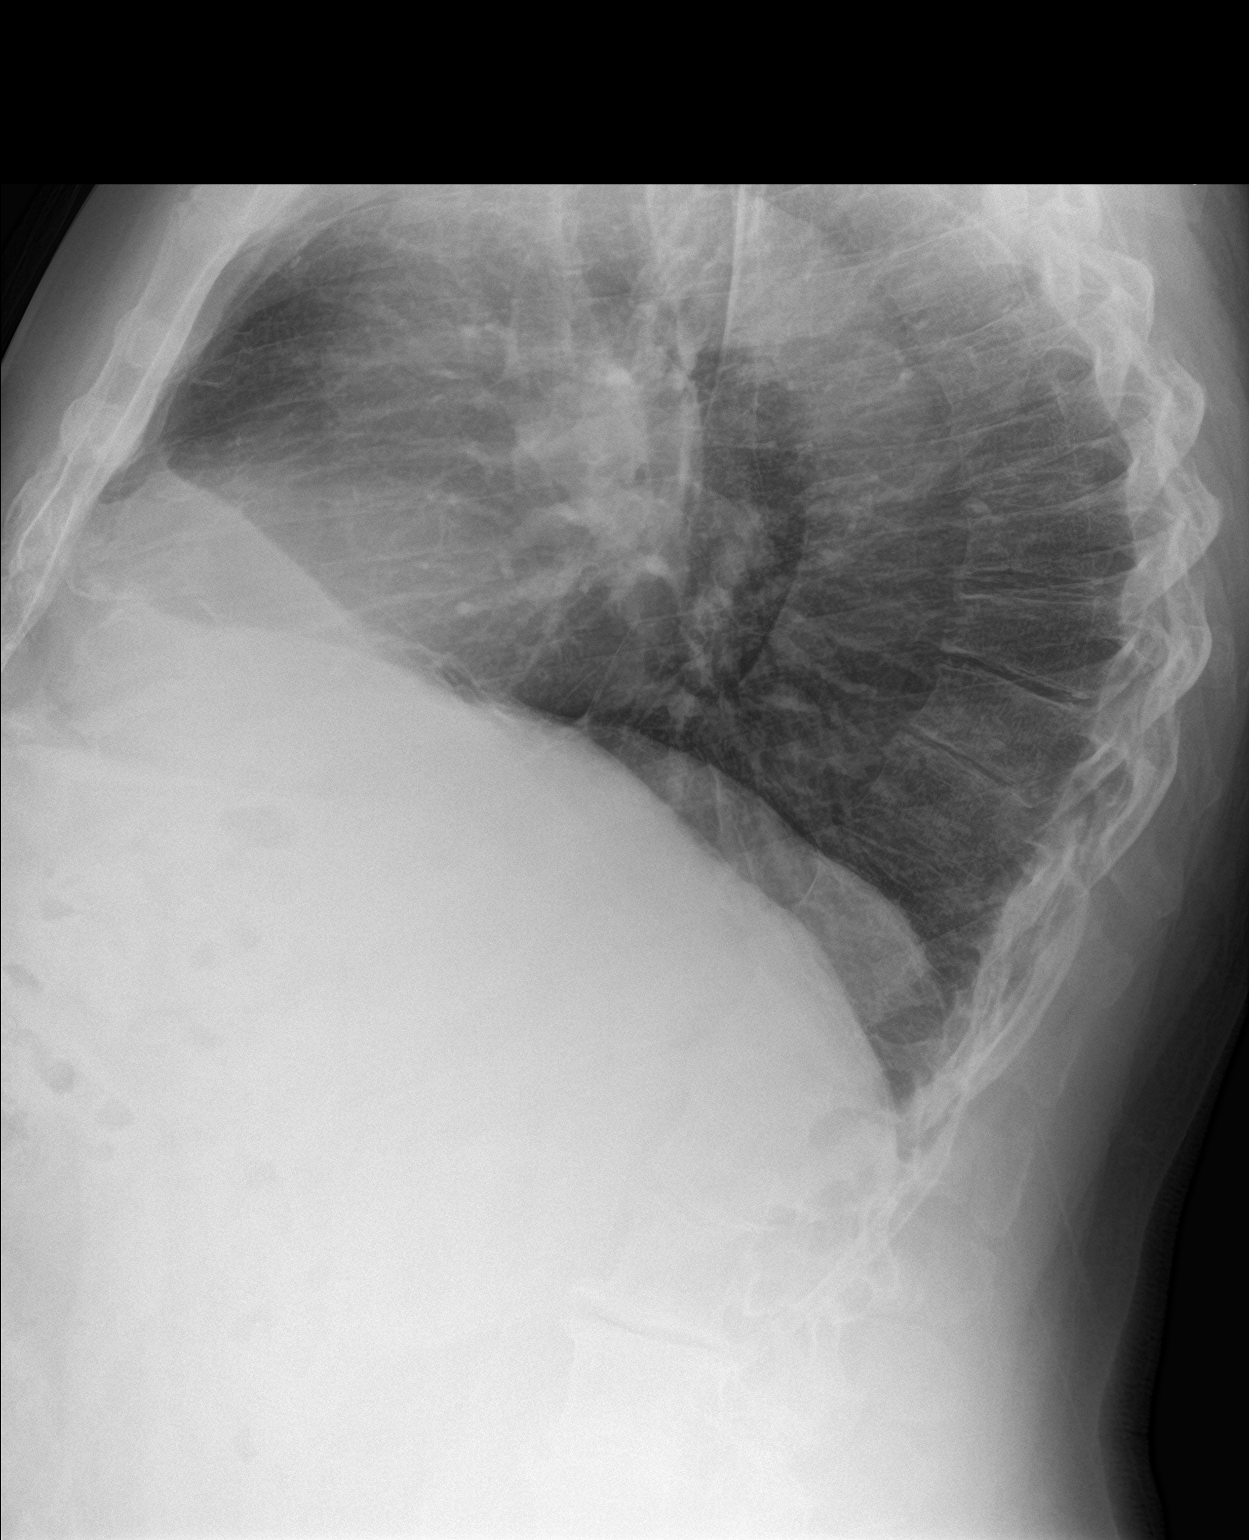

[2 of 2 positions shown; findings below may reference images not displayed]

FINDINGS: The lungs are reasonably well inflated. The lung markings are coarse
at both bases but are stable. There is no alveolar infiltrate. There
is no pleural effusion. The heart is top-normal in size but stable.
There is no pulmonary vascular congestion. There is marked
tortuosity of the descending thoracic aorta with mural
calcification. There is multilevel degenerative disc disease of the
thoracic spine with moderate dextrocurvature centered at
approximately T8.
IMPRESSION: Chronic bibasilar atelectasis or scarring.  No pneumonia nor CHF.

Thoracic aortic atherosclerosis.

## 2018-01-12 NOTE — Progress Notes (Signed)
[name] [place], [place] [address] Phone: [phone] Subjective:    I'm seeing this patient by the request  of: Plotnikov, Evie Lacks, MD    CC: Shoulder pain  QAS:[id number]  Jeffrey [name] is a 67 y.o. male coming in with complaint of right shoulder pain. Fell 6 months ago. Caught himself and a week later he experienced pain. Tore his bicep in the right arm. States he has noticed some grip weakness.  Onset- Chronic Location- Bicep, armpit Duration-now constant Character- Throbs, tight Aggravating factors- loss of ROM, reaching Reliving factors- Ice every night Therapies tried-icing and being careful with Severity-now 9 out of 10.     Past Medical History:  Diagnosis Date  . Anxiety   . Arthritis    knees  . Blood transfusion without reported diagnosis   . BPH (benign prostatic hypertrophy)   . Cataract    "beginnings of"  . Chronic kidney disease    kidney infection  . Depression   . GERD (gastroesophageal reflux disease)   . Hypertension   . Meralgia paresthetica of right side    Past Surgical History:  Procedure Laterality Date  . HERNIA REPAIR     inguinal, umbilical  . SPLENECTOMY     Social History   Socioeconomic History  . Marital status: Married    Spouse name: Not on file  . Number of children: Not on file  . Years of education: Not on file  . Highest education level: Not on file  Occupational History  . Not on file  Social Needs  . Financial resource strain: Not on file  . Food insecurity:    Worry: Not on file    Inability: Not on file  . Transportation needs:    Medical: Not on file    Non-medical: Not on file  Tobacco Use  . Smoking status: Never Smoker  . Smokeless tobacco: Never Used  Substance and Sexual Activity  . Alcohol use: Yes    Alcohol/week: 0.0 oz    Comment: occasional wine  . Drug use: No  . Sexual activity: Yes  Lifestyle  . Physical activity:    Days per week: Not  on file    Minutes per session: Not on file  . Stress: Not on file  Relationships  . Social connections:    Talks on phone: Not on file    Gets together: Not on file    Attends religious service: Not on file    Active member of club or organization: Not on file    Attends meetings of clubs or organizations: Not on file    Relationship status: Not on file  Other Topics Concern  . Not on file  Social History Narrative   Regular exercise-No   Allergies  Allergen Reactions  . Bupropion Hcl     Unsure of reaction  . Lovastatin     REACTION: myalgia  . Venlafaxine     Unsure of reaction   Family History  Problem Relation Age of Onset  . Heart disease Mother 52       chf  . Hypertension Other   . Colon cancer Neg Hx   . Esophageal cancer Neg Hx   . Stomach cancer Neg Hx   . Rectal cancer Neg Hx      Past medical history, social, surgical and family history all reviewed in electronic medical record.  No pertanent information unless stated regarding to the chief complaint.  Review of Systems:Review of systems updated and as accurate as of 01/13/18  No headache, visual changes, nausea, vomiting, diarrhea, constipation, dizziness, abdominal pain, skin rash, fevers, chills, night sweats, weight loss, swollen lymph nodes, body aches, joint swelling, muscle aches, chest pain, shortness of breath, mood changes.   Objective  Blood pressure (!) 150/90, pulse (!) 59, height 6' (1.829 m), weight 255 lb (115.7 kg), SpO2 97 %. Systems examined below as of 01/13/18   General: No apparent distress alert and oriented x3 mood and affect normal, dressed appropriately.  HEENT: Pupils equal, extraocular movements intact only on the left eye Respiratory: Patient's speak in full sentences and does not appear short of breath  Cardiovascular: No lower extremity edema, non tender, no erythema  Skin: Warm dry intact with no signs of infection or rash on extremities or on axial skeleton.  Abdomen:  Soft nontender  Neuro: Cranial nerves II through XII are intact, neurovascularly intact in all extremities with 2+ DTRs and 2+ pulses.  Lymph: No lymphadenopathy of posterior or anterior cervical chain or axillae bilaterally.  Gait normal with good balance and coordination.  MSK:  Non tender with full range of motion and good stability and symmetric strength and tone of  elbows, wrist, hip, knee and ankles bilaterally.  Arthritic changes of multiple joints  Shoulder: Right Inspection reveals bicep tendon tear Moderate tenderness to palpation diffusely Range of motion does have some mild decreased range of motion lacking last 5 degrees of external rotation in the last 5 degrees of internal rotation.  Actively has forward flexion to 50 degrees. Rotator cuff strength 3 out of 5 compared to the contralateral side Positive impingement Speeds and Yergason's tests normal. Possible labral tear Normal scapular function observed. Painful arc drop arm sign No apprehension sign Contralateral shoulder unremarkable  MSK US performed of: Right This study was ordered, performed, and interpreted by [name] D.O.  Shoulder:   Supraspinatus: Patient is a full-thickness tear noted with 0.8 cm of retraction noted.  Hypoechoic changes noted.  The underlying arthritic changes Subscapularis: Near full-thickness tear noted as well. AC joint: Moderate arthritis Glenohumeral Joint: Degenerative changes posteriorly. Glenoid Labrum:  Intact without visualized tears. Biceps Tendon: Full rupture noted. Impression: Full-thickness rotator cuff tear with retraction  Procedure: Real-time Ultrasound Guided Injection of right glenohumeral joint Device: GE Logiq Q7  Ultrasound guided injection is preferred based studies that show increased duration, increased effect, greater accuracy, decreased procedural pain, increased response rate with ultrasound guided versus blind injection.  Verbal informed consent obtained.    Time-out conducted.  Noted no overlying erythema, induration, or other signs of local infection.  Skin prepped in a sterile fashion.  Local anesthesia: Topical Ethyl chloride.  With sterile technique and under real time ultrasound guidance:  Joint visualized.  23g 1  inch needle inserted posterior approach. Pictures taken for needle placement. Patient did have injection of 2 cc of 1% lidocaine, 2 cc of 0.5% Marcaine, and 1.0 cc of Kenalog 40 mg/dL. Completed without difficulty  Pain immediately resolved suggesting accurate placement of the medication.  Advised to call if fevers/chills, erythema, induration, drainage, or persistent bleeding.  Images permanently stored and available for review in the ultrasound unit.  Impression: Technically successful ultrasound guided injection.     Impression and Recommendations:     This case required medical decision making of moderate complexity.      Note: This dictation was prepared with Dragon dictation along with smaller phrase technology. Any transcriptional errors that result  from this process are unintentional.

## 2018-01-13 ENCOUNTER — Ambulatory Visit (INDEPENDENT_AMBULATORY_CARE_PROVIDER_SITE_OTHER): Payer: Medicare Other | Admitting: Family Medicine

## 2018-01-13 ENCOUNTER — Ambulatory Visit (INDEPENDENT_AMBULATORY_CARE_PROVIDER_SITE_OTHER)
Admission: RE | Admit: 2018-01-13 | Discharge: 2018-01-13 | Disposition: A | Discharge status: Home / Self Care | Payer: Medicare Other | Source: Ambulatory Visit | Attending: Family Medicine | Admitting: Family Medicine

## 2018-01-13 ENCOUNTER — Encounter: Payer: Self-pay | Admitting: Family Medicine

## 2018-01-13 ENCOUNTER — Ambulatory Visit: Payer: Self-pay

## 2018-01-13 VITALS — BP 150/90 | HR 59 | Ht 72.0 in | Wt 255.0 lb

## 2018-01-13 DIAGNOSIS — M75101 Unspecified rotator cuff tear or rupture of right shoulder, not specified as traumatic: Secondary | ICD-10-CM | POA: Insufficient documentation

## 2018-01-13 DIAGNOSIS — M75121 Complete rotator cuff tear or rupture of right shoulder, not specified as traumatic: Secondary | ICD-10-CM | POA: Diagnosis not present

## 2018-01-13 DIAGNOSIS — M25511 Pain in right shoulder: Secondary | ICD-10-CM | POA: Diagnosis not present

## 2018-01-13 DIAGNOSIS — G8929 Other chronic pain: Secondary | ICD-10-CM

## 2018-01-13 DIAGNOSIS — M19011 Primary osteoarthritis, right shoulder: Secondary | ICD-10-CM | POA: Diagnosis not present

## 2018-01-13 NOTE — Patient Instructions (Signed)
Good to see you  Jeffrey [name] is your friend. Ice 20 minutes 2 times daily. Usually after activity and before bed. pennsaid pinkie amount topically 2 times daily as needed.   We injected the shoulder and will hope Xray downstairs of right shoulder See me again in 2 weeks

## 2018-01-13 NOTE — Assessment & Plan Note (Signed)
Patient is a what appears to be of full right-sided rotator cuff tear.  We discussed with patient in great length.  X-rays ordered today to evaluate for the amount of arthritis that is also contributing.  Given the injection to see if this will be beneficial.  We discussed that if any significant worsening of the retraction could delay any type of arthroscopic procedure that could be beneficial.  Patient understands.  Follow-up again in 2 weeks

## 2018-01-17 NOTE — Progress Notes (Signed)
Says he's feeling better

## 2018-01-31 NOTE — Progress Notes (Signed)
[name] [name], [name] [address] Phone: [phone] Subjective:      CC: Right shoulder pain follow-up  [id number] Jeffrey [name] is a 67 y.o. male coming in with complaint of right shoulder pain. He has had improvement but states that he is able to sleep and turn his car on. He still has pain with shoulder flexion.  Found to have a rotator cuff tear.  Did have retraction.  Given injection January 13, 2018.  States that he is feeling 60-70% better.  Still notices significant weakness      Past Medical History:  Diagnosis Date  . Anxiety   . Arthritis    knees  . Blood transfusion without reported diagnosis   . BPH (benign prostatic hypertrophy)   . Cataract    "beginnings of"  . Chronic kidney disease    kidney infection  . Depression   . GERD (gastroesophageal reflux disease)   . Hypertension   . Meralgia paresthetica of right side    Past Surgical History:  Procedure Laterality Date  . HERNIA REPAIR     inguinal, umbilical  . SPLENECTOMY     Social History   Socioeconomic History  . Marital status: Married    Spouse name: Not on file  . Number of children: Not on file  . Years of education: Not on file  . Highest education level: Not on file  Occupational History  . Not on file  Social Needs  . Financial resource strain: Not on file  . Food insecurity:    Worry: Not on file    Inability: Not on file  . Transportation needs:    Medical: Not on file    Non-medical: Not on file  Tobacco Use  . Smoking status: Never Smoker  . Smokeless tobacco: Never Used  Substance and Sexual Activity  . Alcohol use: Yes    Alcohol/week: 0.0 oz    Comment: occasional wine  . Drug use: No  . Sexual activity: Yes  Lifestyle  . Physical activity:    Days per week: Not on file    Minutes per session: Not on file  . Stress: Not on file  Relationships  . Social connections:    Talks on phone: Not on file    Gets  together: Not on file    Attends religious service: Not on file    Active member of club or organization: Not on file    Attends meetings of clubs or organizations: Not on file    Relationship status: Not on file  Other Topics Concern  . Not on file  Social History Narrative   Regular exercise-No   Allergies  Allergen Reactions  . Bupropion Hcl     Unsure of reaction  . Lovastatin     REACTION: myalgia  . Venlafaxine     Unsure of reaction   Family History  Problem Relation Age of Onset  . Heart disease Mother 14       chf  . Hypertension Other   . Colon cancer Neg Hx   . Esophageal cancer Neg Hx   . Stomach cancer Neg Hx   . Rectal cancer Neg Hx      Past medical history, social, surgical and family history all reviewed in electronic medical record.  No pertanent information unless stated regarding to the chief complaint.   Review of Systems:Review of systems updated and as  No headache, visual changes, nausea, vomiting, diarrhea,  constipation, dizziness, abdominal pain, skin rash, fevers, chills, night sweats, weight loss, swollen lymph nodes, body aches, joint swelling, muscle aches, chest pain, shortness of breath, mood changes.   Objective  Blood pressure 122/72, pulse 67, height 6' (1.829 m), weight 255 lb (115.7 kg), SpO2 93 %.   General: No apparent distress alert and oriented x3 mood and affect normal, dressed appropriately.  HEENT: Pupils equal, extraocular movements intact  Respiratory: Patient's speak in full sentences and does not appear short of breath  Cardiovascular: No lower extremity edema, non tender, no erythema  Skin: Warm dry intact with no signs of infection or rash on extremities or on axial skeleton.  Abdomen: Soft nontender  Neuro: Cranial nerves II through XII are intact, neurovascularly intact in all extremities with 2+ DTRs and 2+ pulses.  Lymph: No lymphadenopathy of posterior or anterior cervical chain or axillae bilaterally.  Gait normal  with good balance and coordination.  MSK: Mild tender with full range of motion and good stability and symmetric strength and tone of  elbows, wrist, hip, knee and ankles bilaterally.  Right rotator cuff shows the patient does still have weakness with 4+ out of 5 strength compared to the contralateral side.  Mild limitation in external range of motion.  Positive O'Brien test noted.  Impingement with Neer's and Hawkins.  Grip strength intact and neurovascularly intact distally   Limited musculoskeletal ultrasound was performed and interpreted by Lyndal Pulley  Limited ultrasound shows the patient does continue to have a rotator cuff tear with retraction of 0.8 cm.  Minimal underlying osteoarthritic changes.  Significant decrease in hypoechoic changes Impression: Continued rotator cuff tear    Impression and Recommendations:     This case required medical decision making of moderate complexity.      Note: This dictation was prepared with Dragon dictation along with smaller phrase technology. Any transcriptional errors that result from this process are unintentional.

## 2018-02-01 ENCOUNTER — Ambulatory Visit: Payer: Self-pay

## 2018-02-01 ENCOUNTER — Ambulatory Visit (INDEPENDENT_AMBULATORY_CARE_PROVIDER_SITE_OTHER): Payer: Medicare Other | Admitting: Family Medicine

## 2018-02-01 VITALS — BP 122/72 | HR 67 | Ht 72.0 in | Wt 255.0 lb

## 2018-02-01 DIAGNOSIS — M25511 Pain in right shoulder: Principal | ICD-10-CM

## 2018-02-01 DIAGNOSIS — M75121 Complete rotator cuff tear or rupture of right shoulder, not specified as traumatic: Secondary | ICD-10-CM

## 2018-02-01 DIAGNOSIS — G8929 Other chronic pain: Secondary | ICD-10-CM

## 2018-02-01 NOTE — Patient Instructions (Addendum)
Good to see you  Jeffrey [name] is your friend.  Stay active.  pennsaid pinkie amount topically 2 times daily as needed.  PT will be calling you soon and get you set up  Keep hands within peripheral vision.  No lifting more then 5 pounds for next 2 weeks then 10 pound max until I see you again  See me again in 5-6 weeks

## 2018-02-02 ENCOUNTER — Encounter: Payer: Self-pay | Admitting: Family Medicine

## 2018-02-02 NOTE — Assessment & Plan Note (Signed)
Minimal interval healing noted.  Patient was to continue with conservative therapy.  Sent to physical therapy.  Discussed icing regimen, topical anti-inflammatories and continuing other medications follow-up in 6 weeks

## 2018-02-15 ENCOUNTER — Ambulatory Visit: Payer: Medicare Other | Attending: Family Medicine | Admitting: Physical Therapy

## 2018-02-15 ENCOUNTER — Other Ambulatory Visit: Payer: Self-pay

## 2018-02-15 ENCOUNTER — Encounter: Payer: Self-pay | Admitting: Physical Therapy

## 2018-02-15 DIAGNOSIS — R293 Abnormal posture: Secondary | ICD-10-CM | POA: Diagnosis not present

## 2018-02-15 DIAGNOSIS — M25511 Pain in right shoulder: Secondary | ICD-10-CM | POA: Insufficient documentation

## 2018-02-15 DIAGNOSIS — G8929 Other chronic pain: Secondary | ICD-10-CM | POA: Insufficient documentation

## 2018-02-15 DIAGNOSIS — R29898 Other symptoms and signs involving the musculoskeletal system: Secondary | ICD-10-CM | POA: Insufficient documentation

## 2018-02-15 DIAGNOSIS — M6281 Muscle weakness (generalized): Secondary | ICD-10-CM | POA: Diagnosis not present

## 2018-02-15 NOTE — Patient Instructions (Signed)
Resistive Band Rowing   With resistive band anchored in door, grasp both ends. Keeping elbows bent, pull back, squeezing shoulder blades together. Hold __3-5__ seconds. Repeat __10-15__ times.   Strengthening: Resisted Internal Rotation   Hold tubing in right hand, elbow at side and forearm out. Rotate forearm in across body. Repeat __10-15__ times per set. Do __2__ sets per session.   Strengthening: Resisted External Rotation   Hold tubing in right hand, elbow at side and forearm across body. Rotate forearm out. Repeat __10-15__ times per set. Do __2__ sets per session.  **keep arm by body and step sideways**  Resisted Horizontal Abduction: Bilateral   Sit or stand, tubing in both hands, arms out in front. Keeping arms straight, pinch shoulder blades together and stretch arms out. Repeat __10-15__ times per set.

## 2018-02-15 NOTE — Therapy (Signed)
[facility] [address]  [place], [place], [address] Phone: [phone]   Fax:  [phone]  Physical Therapy Evaluation  Patient Details  Name: Jeffrey [name] MRN: [id number] Date of Birth: 1951/02/12 Referring Provider: Dr. Hulan Saas   Encounter Date: 02/15/2018  PT End of Session - 02/15/18 1409    Visit Number  1    Number of Visits  12    Date for PT Re-Evaluation  03/29/18    Authorization Type  Medicare    PT Start Time  3474    PT Stop Time  1342    PT Time Calculation (min)  37 min    Activity Tolerance  Patient tolerated treatment well    Behavior During Therapy  [facility] for tasks assessed/performed       Past Medical History:  Diagnosis Date  . Anxiety   . Arthritis    knees  . Blood transfusion without reported diagnosis   . BPH (benign prostatic hypertrophy)   . Cataract    "beginnings of"  . Chronic kidney disease    kidney infection  . Depression   . GERD (gastroesophageal reflux disease)   . Hypertension   . Meralgia paresthetica of right side     Past Surgical History:  Procedure Laterality Date  . HERNIA REPAIR     inguinal, umbilical  . SPLENECTOMY      There were no vitals filed for this visit.   Subjective Assessment - 02/15/18 1306    Subjective  Patient reporting a fall out of a semi 6-8 months ago - missed a step and landed on arms - few weeks later had significant bruising - chiro told him he had a torn bicep. In March - pain started as well as reduced strength at hand/arm - seen by MD - torn rotator cuff. Had injection ~4 weeks ago and feeling 75% better. Wants to remain conservative. Biggest complaint is pain with overhead reaching. Told not to lift greater than 10#. trying to limit self at work.     Pertinent History  HTN    Diagnostic tests  MSK u/s: rotator cuff tear with retraction of 0.8 cm    Patient Stated Goals  improve pain and function    Currently in Pain?   Yes    Pain Score  2     Pain Location  Shoulder    Pain Orientation  Right    Pain Descriptors / Indicators  Sore;Aching    Pain Type  Chronic pain    Aggravating Factors   lifting overhead    Pain Relieving Factors  ice, topical cream         [facility] PT Assessment - 02/15/18 1303      Assessment   Medical Diagnosis  Chronic R shoulder pain    Referring Provider  Dr. Hulan Saas    Onset Date/Surgical Date  -- 6-8 months ago    Hand Dominance  Left    Next MD Visit  prn    Prior Therapy  no      Precautions   Precautions  None      Restrictions   Weight Bearing Restrictions  No      Balance Screen   Has the patient fallen in the past 6 months  Yes    How many times?  1    Has the patient had a decrease in activity level because of a fear of falling?   No  Is the patient reluctant to leave their home because of a fear of falling?   No      Home Film/video editor residence    Living Arrangements  Spouse/significant other      Cognition   Overall Cognitive Status  Within Functional Limits for tasks assessed      Observation/Other Assessments   Focus on Therapeutic Outcomes (FOTO)   Shoulder: 61 (39% limited, predicted 30% limited)      Sensation   Light Touch  Appears Intact      Coordination   Gross Motor Movements are Fluid and Coordinated  No limited due to RTC tear      Posture/Postural Control   Posture/Postural Control  Postural limitations    Postural Limitations  Rounded Shoulders;Forward head      ROM / Strength   AROM / PROM / Strength  AROM;Strength      AROM   AROM Assessment Site  Shoulder    Right/Left Shoulder  Right    Right Shoulder Flexion  142 Degrees    Right Shoulder ABduction  137 Degrees    Right Shoulder Internal Rotation  -- FIR ~L3    Right Shoulder External Rotation  -- FER ~C4      Strength   Strength Assessment Site  Shoulder    Right/Left Shoulder  Right;Left    Right Shoulder Flexion  3+/5     Right Shoulder ABduction  3/5    Right Shoulder Internal Rotation  4/5    Right Shoulder External Rotation  3-/5    Left Shoulder Flexion  4+/5    Left Shoulder ABduction  4+/5    Left Shoulder Internal Rotation  5/5    Left Shoulder External Rotation  4+/5      Palpation   Palpation comment  diffusely non-tender                Objective measurements completed on examination: See above findings.      [facility] Adult PT Treatment/Exercise - 02/15/18 1303      Exercises   Exercises  Shoulder      Shoulder Exercises: Standing   External Rotation  Strengthening;Right;10 reps;Theraband isometric step outs    Theraband Level (Shoulder External Rotation)  Level 2 (Red)    Internal Rotation  Strengthening;Right;12 reps;Theraband    Theraband Level (Shoulder Internal Rotation)  Level 2 (Red)    Row  Strengthening;Both;12 reps;Theraband    Theraband Level (Shoulder Row)  Level 2 (Red)             PT Education - 02/15/18 1407    Education provided  Yes    Education Details  exam findings, POC, HEP    Person(s) Educated  Patient    Methods  Explanation;Demonstration;Handout    Comprehension  Verbalized understanding;Returned demonstration          PT Long Term Goals - 02/15/18 1507      PT LONG TERM GOAL #1   Title  patient to be independent with advanced HEP    Status  New    Target Date  03/29/18      PT LONG TERM GOAL #2   Title  patient to improve R shoulder AROM to WNL without pain    Status  New    Target Date  03/29/18      PT LONG TERM GOAL #3   Title  patient to improve R shoulder strength to >/= 4/5 without pain  Status  New    Target Date  03/29/18      PT LONG TERM GOAL #4   Title  patient to demonstrate appropriate posture and body mechanics as it relates to his daily activities    Status  New    Target Date  03/29/18             Plan - 02/15/18 1503    Clinical Impression Statement  Jeffrey [name] is a pleasant 67 y/o male  presenting to [place] today regarding primary complaints of R shoulder pain and weakness following a fall ~6-8 months ago with resultant RTC tear. Patient and MD discussing treating conservatively as patient has regained a good bit of motion following injection. Patient today with primary limitations with end range AROM, as well as strength in all planes with ER most limited. Patient to benefit from skilled PT intervention to address the above listed deficits to allow for improved funtional use of R shoulder.    Clinical Presentation  Stable    Clinical Decision Making  Low    Rehab Potential  Good    PT Frequency  2x / week    PT Duration  6 weeks    PT Treatment/Interventions  ADLs/Self Care Home Management;Cryotherapy;Electrical Stimulation;Iontophoresis 4mg /ml Dexamethasone;Moist Heat;Therapeutic exercise;Therapeutic activities;Neuromuscular re-education;Patient/family education;Manual techniques;Vasopneumatic Device;Taping;Dry needling;Passive range of motion    Consulted and Agree with Plan of Care  Patient       Patient will benefit from skilled therapeutic intervention in order to improve the following deficits and impairments:  Pain, Impaired UE functional use, Decreased strength, Decreased range of motion, Decreased activity tolerance  Visit Diagnosis: Chronic right shoulder pain  Muscle weakness (generalized)  Abnormal posture  Other symptoms and signs involving the musculoskeletal system     Problem List Patient Active Problem List   Diagnosis Date Noted  . Right rotator cuff tear 01/13/2018  . Abdominal pain 03/18/2016  . Cervical disc disorder with radiculopathy of cervical region 12/06/2015  . Edema 12/06/2015  . Lower back pain 07/04/2015  . Injury of popliteal region 12/07/2014  . Right knee pain 11/13/2013  . Pneumonia 12/21/2012  . Fatigue 12/21/2012  . Dry mouth 12/21/2012  . Actinic keratoses 09/09/2012  . Well adult exam 06/25/2011  . Post-splenectomy  06/25/2011  . PSA elevation 06/25/2011  . VENTRAL HERNIA 05/23/2010  . Meralgia paresthetica 03/19/2009  . Anxiety state 06/25/2007  . ERECTILE DYSFUNCTION 06/25/2007  . DEPRESSION 06/25/2007  . Essential hypertension 06/25/2007  . GERD 06/25/2007  . BPH (benign prostatic hyperplasia) 06/25/2007     Lanney Gins, PT, DPT 02/15/18 3:14 PM   [facility] [address]  [place], [place], [address] Phone: [phone]   Fax:  [phone]  Name: Jeffrey [name] MRN: [id number] Date of Birth: 05-26-1951

## 2018-02-21 ENCOUNTER — Ambulatory Visit: Payer: Medicare Other

## 2018-02-21 DIAGNOSIS — R29898 Other symptoms and signs involving the musculoskeletal system: Secondary | ICD-10-CM | POA: Diagnosis not present

## 2018-02-21 DIAGNOSIS — M6281 Muscle weakness (generalized): Secondary | ICD-10-CM | POA: Diagnosis not present

## 2018-02-21 DIAGNOSIS — G8929 Other chronic pain: Secondary | ICD-10-CM | POA: Diagnosis not present

## 2018-02-21 DIAGNOSIS — R293 Abnormal posture: Secondary | ICD-10-CM | POA: Diagnosis not present

## 2018-02-21 DIAGNOSIS — M25511 Pain in right shoulder: Secondary | ICD-10-CM | POA: Diagnosis not present

## 2018-02-21 NOTE — Therapy (Signed)
[facility] [address]  [place], [place], [address] Phone: [phone]   Fax:  [phone]  Physical Therapy Treatment  Patient Details  Name: Jeffrey [name] MRN: [id number] Date of Birth: July 27, 1951 Referring Provider: Dr. Hulan Saas   Encounter Date: 02/21/2018  PT End of Session - 02/21/18 1312    Visit Number  2    Number of Visits  12    Date for PT Re-Evaluation  03/29/18    Authorization Type  Medicare    PT Start Time  1312    PT Stop Time  1353    PT Time Calculation (min)  41 min    Activity Tolerance  Patient tolerated treatment well    Behavior During Therapy  [facility] for tasks assessed/performed       Past Medical History:  Diagnosis Date  . Anxiety   . Arthritis    knees  . Blood transfusion without reported diagnosis   . BPH (benign prostatic hypertrophy)   . Cataract    "beginnings of"  . Chronic kidney disease    kidney infection  . Depression   . GERD (gastroesophageal reflux disease)   . Hypertension   . Meralgia paresthetica of right side     Past Surgical History:  Procedure Laterality Date  . HERNIA REPAIR     inguinal, umbilical  . SPLENECTOMY      There were no vitals filed for this visit.  Subjective Assessment - 02/21/18 1317    Subjective  Had some difficulty with HEP ER activity.      Pertinent History  HTN    Diagnostic tests  MSK u/s: rotator cuff tear with retraction of 0.8 cm    Patient Stated Goals  improve pain and function    Currently in Pain?  Yes    Pain Score  5     Pain Location  Shoulder    Pain Orientation  Right    Pain Descriptors / Indicators  Sore;Aching    Pain Type  Chronic pain    Multiple Pain Sites  No                       [facility] Adult PT Treatment/Exercise - 02/21/18 1341      Shoulder Exercises: Supine   Protraction  Right;15 reps    Protraction Limitations  Cues for proper motion     Other Supine Exercises  R  shoulder rhythmic stabilization in sustained 90 dg flexion position 2 x 30 sec       Shoulder Exercises: Standing   External Rotation  Strengthening;Right;10 reps;Theraband    Theraband Level (Shoulder External Rotation)  Level 2 (Red)    External Rotation Limitations  isometric step-outs heavy vc's to "reset" neutral shld position btw reps     Internal Rotation  Strengthening;Right;Theraband;15 reps    Theraband Level (Shoulder Internal Rotation)  Level 2 (Red)    Internal Rotation Limitations  Cueing to maintain elbow by side     Flexion  Right;AAROM;10 reps    Flexion Limitations  orange p-ball roll up wall  required intermittent cueing to avoid shoulder "hike"    Extension  Both;10 reps;Theraband    Theraband Level (Shoulder Extension)  Level 2 (Red)    Extension Limitations  B scapular retraction  Cues for full scap. retraction/depression     Scientist, research (life sciences);Theraband;15 reps    Theraband Level (Shoulder Row)  Level 2 (Red)  Row Limitations  Tactile cues required for scapular retraction and vc's to reduce scap. elevation       Shoulder Exercises: ROM/Strengthening   UBE (Upper Arm Bike)  Lvl: 1.0, 3 min forward/backwards       Shoulder Exercises: Stretch   Other Shoulder Stretches  R UT stretch x 30 sec       Manual Therapy   Manual Therapy  Soft tissue mobilization    Manual therapy comments  Seated     Soft tissue mobilization  STM to R UT in area of reported "tightness"                  PT Long Term Goals - 02/21/18 1313      PT LONG TERM GOAL #1   Title  patient to be independent with advanced HEP    Status  On-going      PT LONG TERM GOAL #2   Title  patient to improve R shoulder AROM to WNL without pain    Status  On-going      PT LONG TERM GOAL #3   Title  patient to improve R shoulder strength to >/= 4/5 without pain    Status  On-going      PT LONG TERM GOAL #4   Title  patient to demonstrate appropriate posture and body mechanics as it  relates to his daily activities    Status  On-going            Plan - 02/21/18 1322    Clinical Impression Statement  Jeffrey [name] reporting HEP going well with exception of red TB resisted ER activity, which he reports is "difficult".  Review of this activity revealed pt. only performing in partial ROM and with fast pacing and poor control.  Pt. issued yellow TB for ER HEP activity, as he was more able to control motion with yellow band.  Tolerated addition of gentle rhythmic stabilization activities, and scapular/RTC strengthening well today however needed frequent tactile cueing for proper scapular motion and to avoid shoulder "hike" with elevation activities.  Will monitor response to today's therex and progress per pt. in upcoming visit.      PT Treatment/Interventions  ADLs/Self Care Home Management;Cryotherapy;Electrical Stimulation;Iontophoresis 4mg /ml Dexamethasone;Moist Heat;Therapeutic exercise;Therapeutic activities;Neuromuscular re-education;Patient/family education;Manual techniques;Vasopneumatic Device;Taping;Dry needling;Passive range of motion    Consulted and Agree with Plan of Care  Patient       Patient will benefit from skilled therapeutic intervention in order to improve the following deficits and impairments:  Pain, Impaired UE functional use, Decreased strength, Decreased range of motion, Decreased activity tolerance  Visit Diagnosis: Chronic right shoulder pain  Muscle weakness (generalized)  Abnormal posture  Other symptoms and signs involving the musculoskeletal system     Problem List Patient Active Problem List   Diagnosis Date Noted  . Right rotator cuff tear 01/13/2018  . Abdominal pain 03/18/2016  . Cervical disc disorder with radiculopathy of cervical region 12/06/2015  . Edema 12/06/2015  . Lower back pain 07/04/2015  . Injury of popliteal region 12/07/2014  . Right knee pain 11/13/2013  . Pneumonia 12/21/2012  . Fatigue 12/21/2012  . Dry mouth  12/21/2012  . Actinic keratoses 09/09/2012  . Well adult exam 06/25/2011  . Post-splenectomy 06/25/2011  . PSA elevation 06/25/2011  . VENTRAL HERNIA 05/23/2010  . Meralgia paresthetica 03/19/2009  . Anxiety state 06/25/2007  . ERECTILE DYSFUNCTION 06/25/2007  . DEPRESSION 06/25/2007  . Essential hypertension 06/25/2007  . GERD 06/25/2007  . BPH (benign prostatic  hyperplasia) 06/25/2007    Bess Harvest, PTA 02/21/18 2:15 PM  [facility] [address]  [place], [place], [address] Phone: [phone]   Fax:  [phone]  Name: Jeffrey [name] MRN: [id number] Date of Birth: 08-Jul-1951

## 2018-02-23 ENCOUNTER — Ambulatory Visit: Payer: Medicare Other | Attending: Family Medicine

## 2018-02-23 DIAGNOSIS — R293 Abnormal posture: Secondary | ICD-10-CM

## 2018-02-23 DIAGNOSIS — M6281 Muscle weakness (generalized): Secondary | ICD-10-CM

## 2018-02-23 DIAGNOSIS — G8929 Other chronic pain: Secondary | ICD-10-CM

## 2018-02-23 DIAGNOSIS — M25511 Pain in right shoulder: Secondary | ICD-10-CM | POA: Diagnosis not present

## 2018-02-23 DIAGNOSIS — R29898 Other symptoms and signs involving the musculoskeletal system: Secondary | ICD-10-CM | POA: Diagnosis not present

## 2018-02-23 NOTE — Addendum Note (Signed)
Addended by: Lanney Gins on: 02/23/2018 05:41 PM   Modules accepted: Orders

## 2018-02-23 NOTE — Therapy (Signed)
[facility] [address]  [place], [place], [address] Phone: [phone]   Fax:  [phone]  Physical Therapy Treatment  Patient Details  Name: Jeffrey [name] MRN: [id number] Date of Birth: [birth year] Referring Provider: Dr. Hulan Saas   Encounter Date: 02/23/2018  PT End of Session - 02/23/18 1400    Visit Number  3    Number of Visits  12    Date for PT Re-Evaluation  03/29/18    Authorization Type  Medicare    PT Start Time  1355    PT Stop Time  1439    PT Time Calculation (min)  44 min    Activity Tolerance  Patient tolerated treatment well    Behavior During Therapy  [facility] for tasks assessed/performed       Past Medical History:  Diagnosis Date  . Anxiety   . Arthritis    knees  . Blood transfusion without reported diagnosis   . BPH (benign prostatic hypertrophy)   . Cataract    "beginnings of"  . Chronic kidney disease    kidney infection  . Depression   . GERD (gastroesophageal reflux disease)   . Hypertension   . Meralgia paresthetica of right side     Past Surgical History:  Procedure Laterality Date  . HERNIA REPAIR     inguinal, umbilical  . SPLENECTOMY      There were no vitals filed for this visit.  Subjective Assessment - 02/23/18 1358    Subjective  Pt. noting some increased pain after performing HEP however this has subsided today.      Pertinent History  HTN    Diagnostic tests  MSK u/s: rotator cuff tear with retraction of 0.8 cm    Patient Stated Goals  improve pain and function    Currently in Pain?  No/denies    Pain Score  0-No pain    Multiple Pain Sites  No                       [facility] Adult PT Treatment/Exercise - 02/23/18 1359      Shoulder Exercises: Supine   Protraction  Right;10 reps    Protraction Limitations  2# Cues required to maintain elbow extension     Flexion  10 reps;AAROM    Flexion Limitations  1# on wand; reporting occasional  "catch" at R shoulder at 100 dg return motion     Other Supine Exercises  R shoulder circles in 90 dg flexion position CW, CCW 1# x 10 reps each way       Shoulder Exercises: Prone   Retraction  Right;10 reps;Weights    Retraction Weight (lbs)  2    Retraction Limitations  Cues for scapular retraction     Extension  Right;10 reps;Weights    Extension Weight (lbs)  1    Extension Limitations  Cues for scapular retraction       Shoulder Exercises: Sidelying   External Rotation  Right;10 reps    External Rotation Limitations  terminated after 10 reps as pt. demo'd fatigue unable to perform in full ROM     ABduction  Right;10 reps    ABduction Limitations  performed in painfree 0-100 dg range; cues required to stop short of pain at end range ~ 110 dg     Other Sidelying Exercises  R shoulder circles in 90 dg abduction position CW, CCW with 1# x 10 reps each  way       Shoulder Exercises: Standing   Flexion  Both;10 reps    Flexion Limitations  0-90 dg; leaning on 1/2 foam bolster with cues for scapular retraction/depression before each rep excessive R scap. elevation above 90 dg     Extension  Both;15 reps;Theraband    Theraband Level (Shoulder Extension)  Level 2 (Red)    Extension Limitations  Cues for scapular depression/retraction       Shoulder Exercises: ROM/Strengthening   UBE (Upper Arm Bike)  Lvl: 1.5, 3 min forward/backwards     Cybex Row  10 reps    Cybex Row Limitations  10#      Shoulder Exercises: IT sales professional  2 reps;30 seconds    Corner Stretch Limitations  low in doorway       Manual Therapy   Manual Therapy  Soft tissue mobilization;Myofascial release    Manual therapy comments  L sidelying     Soft tissue mobilization  STM to R shoulder complex; ttp at R infra, teres group     Myofascial Release  TPR to R infra             PT Education - 02/23/18 1454    Education provided  Yes    Education Details  self-ball release to posterior shoulder on  wall, band shoulder extension     Person(s) Educated  Patient    Methods  Explanation;Demonstration;Verbal cues;Handout    Comprehension  Verbalized understanding;Returned demonstration;Verbal cues required;Need further instruction          PT Long Term Goals - 02/21/18 1313      PT LONG TERM GOAL #1   Title  patient to be independent with advanced HEP    Status  On-going      PT LONG TERM GOAL #2   Title  patient to improve R shoulder AROM to WNL without pain    Status  On-going      PT LONG TERM GOAL #3   Title  patient to improve R shoulder strength to >/= 4/5 without pain    Status  On-going      PT LONG TERM GOAL #4   Title  patient to demonstrate appropriate posture and body mechanics as it relates to his daily activities    Status  On-going            Plan - 02/23/18 1400    Clinical Impression Statement  Jeffrey [name] reporting some soreness following HEP however, this soreness has subsided today.  Tolerated progression of RTC/scapular strengthening activities well today.  With increased R shoulder "hike" substitution with elevation activities above 90 dg however improved technique with cueing today.  Does require close monitoring and frequent cueing for proper scapular mechanics with therex.  Ended treatment pain free thus modalities deferred.  Progressing well with strengthening activities.      PT Treatment/Interventions  ADLs/Self Care Home Management;Cryotherapy;Electrical Stimulation;Iontophoresis 4mg /ml Dexamethasone;Moist Heat;Therapeutic exercise;Therapeutic activities;Neuromuscular re-education;Patient/family education;Manual techniques;Vasopneumatic Device;Taping;Dry needling;Passive range of motion    Consulted and Agree with Plan of Care  Patient       Patient will benefit from skilled therapeutic intervention in order to improve the following deficits and impairments:  Pain, Impaired UE functional use, Decreased strength, Decreased range of motion, Decreased  activity tolerance  Visit Diagnosis: Chronic right shoulder pain  Muscle weakness (generalized)  Abnormal posture  Other symptoms and signs involving the musculoskeletal system     Problem List Patient Active Problem List  Diagnosis Date Noted  . Right rotator cuff tear 01/13/2018  . Abdominal pain 03/18/2016  . Cervical disc disorder with radiculopathy of cervical region 12/06/2015  . Edema 12/06/2015  . Lower back pain 07/04/2015  . Injury of popliteal region 12/07/2014  . Right knee pain 11/13/2013  . Pneumonia 12/21/2012  . Fatigue 12/21/2012  . Dry mouth 12/21/2012  . Actinic keratoses 09/09/2012  . Well adult exam 06/25/2011  . Post-splenectomy 06/25/2011  . PSA elevation 06/25/2011  . VENTRAL HERNIA 05/23/2010  . Meralgia paresthetica 03/19/2009  . Anxiety state 06/25/2007  . ERECTILE DYSFUNCTION 06/25/2007  . DEPRESSION 06/25/2007  . Essential hypertension 06/25/2007  . GERD 06/25/2007  . BPH (benign prostatic hyperplasia) 06/25/2007    Bess Harvest, PTA 02/23/18 2:55 PM   [facility] [address]  [place], [place], [address] Phone: [phone]   Fax:  [phone]  Name: Jeffrey [name] MRN: [id number] Date of Birth: 1951-10-04

## 2018-02-28 ENCOUNTER — Ambulatory Visit: Payer: Medicare Other

## 2018-02-28 ENCOUNTER — Telehealth: Payer: Self-pay | Admitting: Internal Medicine

## 2018-02-28 DIAGNOSIS — G8929 Other chronic pain: Secondary | ICD-10-CM | POA: Diagnosis not present

## 2018-02-28 DIAGNOSIS — M25511 Pain in right shoulder: Principal | ICD-10-CM

## 2018-02-28 DIAGNOSIS — R293 Abnormal posture: Secondary | ICD-10-CM

## 2018-02-28 DIAGNOSIS — M6281 Muscle weakness (generalized): Secondary | ICD-10-CM

## 2018-02-28 DIAGNOSIS — R29898 Other symptoms and signs involving the musculoskeletal system: Secondary | ICD-10-CM

## 2018-02-28 NOTE — Therapy (Signed)
[facility] [address]  [place], [place], [address] Phone: [phone]   Fax:  [phone]  Physical Therapy Treatment  Patient Details  Name: Jeffrey [name] MRN: [id number] Date of Birth: 1951-07-22 Referring Provider: Dr. Hulan Saas   Encounter Date: 02/28/2018  PT End of Session - 02/28/18 1309    Visit Number  4    Number of Visits  12    Date for PT Re-Evaluation  03/29/18    Authorization Type  Medicare    PT Start Time  1314    PT Stop Time  1409    PT Time Calculation (min)  55 min    Activity Tolerance  Patient tolerated treatment well    Behavior During Therapy  [facility] for tasks assessed/performed       Past Medical History:  Diagnosis Date  . Anxiety   . Arthritis    knees  . Blood transfusion without reported diagnosis   . BPH (benign prostatic hypertrophy)   . Cataract    "beginnings of"  . Chronic kidney disease    kidney infection  . Depression   . GERD (gastroesophageal reflux disease)   . Hypertension   . Meralgia paresthetica of right side     Past Surgical History:  Procedure Laterality Date  . HERNIA REPAIR     inguinal, umbilical  . SPLENECTOMY      There were no vitals filed for this visit.  Subjective Assessment - 02/28/18 1316    Subjective  Pt. reporting some aching soreness at R lateral UE which he woke up with this morning and cannot identify trigger.  Pt. admits to "scrubbing" patio on Saturday ~ 15-30 min.      Pertinent History  HTN    Diagnostic tests  MSK u/s: rotator cuff tear with retraction of 0.8 cm    Patient Stated Goals  improve pain and function    Currently in Pain?  Yes    Pain Score  5     Pain Location  Shoulder    Pain Orientation  Right    Pain Descriptors / Indicators  Sore;Aching    Pain Type  Chronic pain    Pain Onset  More than a month ago    Pain Frequency  Constant    Aggravating Factors   unsure     Pain Relieving Factors  ice      Multiple Pain Sites  No                       [facility] Adult PT Treatment/Exercise - 02/28/18 1336      Shoulder Exercises: Standing   External Rotation  Right;15 reps;Strengthening;Theraband    Theraband Level (Shoulder External Rotation)  Level 1 (Yellow)    External Rotation Limitations  isometric step-outs frequent cues required to "reset" neutral position     Internal Rotation  20 reps;Right;Strengthening    Theraband Level (Shoulder Internal Rotation)  Level 2 (Red)    Flexion  Both;15 reps    Flexion Limitations  leaning on doorseal; to pt. tolerance       Shoulder Exercises: ROM/Strengthening   UBE (Upper Arm Bike)  Lvl: 1.5, 3 min forward/backwards     Cybex Row  15 reps 2 sets     Cybex Row Limitations  10# - low and mid handles       Shoulder Exercises: Isometric Strengthening   External Rotation  5X5" into doorframe  Shoulder Exercises: Stretch   Other Shoulder Stretches  R UT, LS stretch x 30 sec       Modalities   Modalities  Vasopneumatic      Vasopneumatic   Number Minutes Vasopneumatic   10 minutes    Vasopnuematic Location   Shoulder R    Vasopneumatic Pressure  Low    Vasopneumatic Temperature   coldest temp.        Manual Therapy   Manual Therapy  Soft tissue mobilization;Myofascial release    Manual therapy comments  L sidelying     Soft tissue mobilization  STM to R shoulder complex, R biceps; ttp at R infra, teres group     Myofascial Release  TPR to R infra, UT; ttp in these areas              PT Education - 02/28/18 1413    Education provided  Yes    Education Details  Isometric yellow TB ER step outs    Person(s) Educated  Patient    Methods  Explanation;Demonstration;Verbal cues;Handout    Comprehension  Verbalized understanding;Returned demonstration;Verbal cues required;Need further instruction          PT Long Term Goals - 02/21/18 1313      PT LONG TERM GOAL #1   Title  patient to be independent with  advanced HEP    Status  On-going      PT LONG TERM GOAL #2   Title  patient to improve R shoulder AROM to WNL without pain    Status  On-going      PT LONG TERM GOAL #3   Title  patient to improve R shoulder strength to >/= 4/5 without pain    Status  On-going      PT LONG TERM GOAL #4   Title  patient to demonstrate appropriate posture and body mechanics as it relates to his daily activities    Status  On-going            Plan - 02/28/18 1310    Clinical Impression Statement  Pt. reporting he "scrubbed" patio at home on Saturday and has had increased R shoulder since this over weekend.  Pt. with increased tenderness in posterior shoulder complex and anterior shoulder/biceps today, which responded well to STM with some relief.  Tolerated mild progression in scapular strengthening today however continues to require ER strengthening performed with isometric step-outs as pt. unable to maintain proper control of movement with band AROM.  Pt. encouraged to perform self-ball massage to posterior shoulder as previously issued via HEP for hopeful continued relief from tightness/tenderness in this area.  Ended treatment with ice/compression to R shoulder with some decrease in pain to end session.  Will continue to progress toward goals per pt. tolerance in coming visits.      PT Treatment/Interventions  ADLs/Self Care Home Management;Cryotherapy;Electrical Stimulation;Iontophoresis 4mg /ml Dexamethasone;Moist Heat;Therapeutic exercise;Therapeutic activities;Neuromuscular re-education;Patient/family education;Manual techniques;Vasopneumatic Device;Taping;Dry needling;Passive range of motion    Consulted and Agree with Plan of Care  Patient       Patient will benefit from skilled therapeutic intervention in order to improve the following deficits and impairments:  Pain, Impaired UE functional use, Decreased strength, Decreased range of motion, Decreased activity tolerance  Visit  Diagnosis: Chronic right shoulder pain  Muscle weakness (generalized)  Abnormal posture  Other symptoms and signs involving the musculoskeletal system     Problem List Patient Active Problem List   Diagnosis Date Noted  . Right rotator cuff tear  01/13/2018  . Abdominal pain 03/18/2016  . Cervical disc disorder with radiculopathy of cervical region 12/06/2015  . Edema 12/06/2015  . Lower back pain 07/04/2015  . Injury of popliteal region 12/07/2014  . Right knee pain 11/13/2013  . Pneumonia 12/21/2012  . Fatigue 12/21/2012  . Dry mouth 12/21/2012  . Actinic keratoses 09/09/2012  . Well adult exam 06/25/2011  . Post-splenectomy 06/25/2011  . PSA elevation 06/25/2011  . VENTRAL HERNIA 05/23/2010  . Meralgia paresthetica 03/19/2009  . Anxiety state 06/25/2007  . ERECTILE DYSFUNCTION 06/25/2007  . DEPRESSION 06/25/2007  . Essential hypertension 06/25/2007  . GERD 06/25/2007  . BPH (benign prostatic hyperplasia) 06/25/2007    Bess Harvest, PTA 02/28/18 2:31 PM  [facility] [address]  [place], [place], [address] Phone: [phone]   Fax:  [phone]  Name: GILL DELROSSI MRN: [id number] Date of Birth: 1951/01/18

## 2018-02-28 NOTE — Telephone Encounter (Signed)
Copied from [place] [id number]. Topic: Quick Communication - Rx Refill/Question >> Feb 28, 2018  2:51 PM Ether Griffins B wrote: Medication: quinapril-hydrochlorothiazide (ACCURETIC) 20-12.5 MG tablet   Pt has CPE scheduled for 04/12/18   Has the patient contacted their pharmacy? Yes.   (Agent: If no, request that the patient contact the pharmacy for the refill.) Preferred Pharmacy (with phone number or street name): [id number] - [place], [address] Agent: Please be advised that RX refills may take up to 3 business days. We ask that you follow-up with your pharmacy.

## 2018-03-01 NOTE — Telephone Encounter (Signed)
Pt called to clarify his request for quinapril-hydrochlorothiazide 20-12.5 mg. Called the pharmacy [facility] [phone]) and they said the patient never pick up the first prescription of medication. Left message on voice mail for patient  to call the office back regarding this medication. Provider  A. Plotnikov LOV   02/11/18

## 2018-03-02 ENCOUNTER — Ambulatory Visit: Payer: Medicare Other

## 2018-03-02 DIAGNOSIS — R293 Abnormal posture: Secondary | ICD-10-CM

## 2018-03-02 DIAGNOSIS — M25511 Pain in right shoulder: Secondary | ICD-10-CM | POA: Diagnosis not present

## 2018-03-02 DIAGNOSIS — G8929 Other chronic pain: Secondary | ICD-10-CM | POA: Diagnosis not present

## 2018-03-02 DIAGNOSIS — R29898 Other symptoms and signs involving the musculoskeletal system: Secondary | ICD-10-CM | POA: Diagnosis not present

## 2018-03-02 DIAGNOSIS — M6281 Muscle weakness (generalized): Secondary | ICD-10-CM | POA: Diagnosis not present

## 2018-03-02 NOTE — Therapy (Signed)
[facility] [address]  [place], [place], [address] Phone: [phone]   Fax:  [phone]  Physical Therapy Treatment  Patient Details  Name: Jeffrey [name] MRN: [id number] Date of Birth: [birth year] Referring Provider: Dr. Hulan Saas   Encounter Date: 03/02/2018  PT End of Session - 03/02/18 1314    Visit Number  5    Number of Visits  12    Date for PT Re-Evaluation  03/29/18    Authorization Type  Medicare    PT Start Time  1309    PT Stop Time  1350    PT Time Calculation (min)  41 min    Activity Tolerance  Patient tolerated treatment well    Behavior During Therapy  [facility] for tasks assessed/performed       Past Medical History:  Diagnosis Date  . Anxiety   . Arthritis    knees  . Blood transfusion without reported diagnosis   . BPH (benign prostatic hypertrophy)   . Cataract    "beginnings of"  . Chronic kidney disease    kidney infection  . Depression   . GERD (gastroesophageal reflux disease)   . Hypertension   . Meralgia paresthetica of right side     Past Surgical History:  Procedure Laterality Date  . HERNIA REPAIR     inguinal, umbilical  . SPLENECTOMY      There were no vitals filed for this visit.  Subjective Assessment - 03/02/18 1312    Subjective  Pt. reporting improvement in pain levels since using tennis ball self-massage on wall.     Pertinent History  HTN    Diagnostic tests  MSK u/s: rotator cuff tear with retraction of 0.8 cm    Patient Stated Goals  improve pain and function    Currently in Pain?  Yes    Pain Score  4     Pain Location  Shoulder    Pain Orientation  Right    Pain Descriptors / Indicators  Sore;Aching    Pain Type  Chronic pain    Pain Onset  More than a month ago    Pain Frequency  Constant    Aggravating Factors   scrubbing back patio    Pain Relieving Factors  Ice    Multiple Pain Sites  No                       [facility] Adult  PT Treatment/Exercise - 03/02/18 1316      Shoulder Exercises: Supine   Protraction  Right;10 reps    Protraction Limitations  3#    External Rotation  Right;15 reps to encourage proper scapular positioning with ER     External Rotation Limitations  with elbow propped on therapist knee in slight scaption position focusing on control and maintaining scapular depression/ retraction     Other Supine Exercises  R shoulder ABC's in sustained 90 dg flexion position x 2       Shoulder Exercises: Seated   External Rotation  Right;15 reps    External Rotation Limitations  5" isometric hold with therapist resistance provided       Shoulder Exercises: Sidelying   External Rotation  Right;12 reps      Shoulder Exercises: Standing   Flexion  Both;10 reps;Strengthening;Weights    Shoulder Flexion Weight (lbs)  1    Flexion Limitations  lening on 1/2 foam bolster     ABduction  Both;10 reps;Weights;Strengthening    Shoulder ABduction Weight (lbs)  1    ABduction Limitations  leaning on 1/2 foam bolster       Shoulder Exercises: ROM/Strengthening   UBE (Upper Arm Bike)  Lvl: 1.5, 3 min forward/backwards     Lat Pull  15 reps    Lat Pull Limitations  15#     Wall Pushups  10 reps    Wall Pushups Limitations  cues to avoid shoulder "hike"                  PT Long Term Goals - 02/21/18 1313      PT LONG TERM GOAL #1   Title  patient to be independent with advanced HEP    Status  On-going      PT LONG TERM GOAL #2   Title  patient to improve R shoulder AROM to WNL without pain    Status  On-going      PT LONG TERM GOAL #3   Title  patient to improve R shoulder strength to >/= 4/5 without pain    Status  On-going      PT LONG TERM GOAL #4   Title  patient to demonstrate appropriate posture and body mechanics as it relates to his daily activities    Status  On-going            Plan - 03/02/18 1315    Clinical Impression Statement  Ronalee Belts doing well today reporting  improved R shoulder pain levels over last few days.  Tolerated gentle progression of RTC/scapular strengthening activities today well.  Still requires cueing with elevation activities to promote proper scapular mechanics and prevent excessive scap. elevation.  Will continue to benefit form further skilled therapy to maximize functional strength and ROM.      PT Treatment/Interventions  ADLs/Self Care Home Management;Cryotherapy;Electrical Stimulation;Iontophoresis 4mg /ml Dexamethasone;Moist Heat;Therapeutic exercise;Therapeutic activities;Neuromuscular re-education;Patient/family education;Manual techniques;Vasopneumatic Device;Taping;Dry needling;Passive range of motion    Consulted and Agree with Plan of Care  Patient       Patient will benefit from skilled therapeutic intervention in order to improve the following deficits and impairments:  Pain, Impaired UE functional use, Decreased strength, Decreased range of motion, Decreased activity tolerance  Visit Diagnosis: Chronic right shoulder pain  Muscle weakness (generalized)  Abnormal posture  Other symptoms and signs involving the musculoskeletal system     Problem List Patient Active Problem List   Diagnosis Date Noted  . Right rotator cuff tear 01/13/2018  . Abdominal pain 03/18/2016  . Cervical disc disorder with radiculopathy of cervical region 12/06/2015  . Edema 12/06/2015  . Lower back pain 07/04/2015  . Injury of popliteal region 12/07/2014  . Right knee pain 11/13/2013  . Pneumonia 12/21/2012  . Fatigue 12/21/2012  . Dry mouth 12/21/2012  . Actinic keratoses 09/09/2012  . Well adult exam 06/25/2011  . Post-splenectomy 06/25/2011  . PSA elevation 06/25/2011  . VENTRAL HERNIA 05/23/2010  . Meralgia paresthetica 03/19/2009  . Anxiety state 06/25/2007  . ERECTILE DYSFUNCTION 06/25/2007  . DEPRESSION 06/25/2007  . Essential hypertension 06/25/2007  . GERD 06/25/2007  . BPH (benign prostatic hyperplasia) 06/25/2007     Bess Harvest, PTA 03/02/18 6:30 PM  [facility] [address]  [place], [place], [address] Phone: [phone]   Fax:  [phone]  Name: Jeffrey [name] MRN: [id number] Date of Birth: 12/08/1950

## 2018-03-07 ENCOUNTER — Ambulatory Visit: Payer: Medicare Other

## 2018-03-07 DIAGNOSIS — R293 Abnormal posture: Secondary | ICD-10-CM

## 2018-03-07 DIAGNOSIS — M25511 Pain in right shoulder: Principal | ICD-10-CM

## 2018-03-07 DIAGNOSIS — M6281 Muscle weakness (generalized): Secondary | ICD-10-CM

## 2018-03-07 DIAGNOSIS — R29898 Other symptoms and signs involving the musculoskeletal system: Secondary | ICD-10-CM | POA: Diagnosis not present

## 2018-03-07 DIAGNOSIS — G8929 Other chronic pain: Secondary | ICD-10-CM | POA: Diagnosis not present

## 2018-03-07 NOTE — Therapy (Signed)
[facility] [address]  [place], [place], [address] Phone: [phone]   Fax:  [phone]  Physical Therapy Treatment  Patient Details  Name: Jeffrey [name] MRN: [id number] Date of Birth: Nov 25, 1950 Referring Provider: Dr. Hulan Saas   Encounter Date: 03/07/2018  PT End of Session - 03/07/18 1313    Visit Number  6    Number of Visits  12    Date for PT Re-Evaluation  03/29/18    Authorization Type  Medicare    PT Start Time  1308    PT Stop Time  1357    PT Time Calculation (min)  49 min    Activity Tolerance  Patient tolerated treatment well    Behavior During Therapy  [facility] for tasks assessed/performed       Past Medical History:  Diagnosis Date  . Anxiety   . Arthritis    knees  . Blood transfusion without reported diagnosis   . BPH (benign prostatic hypertrophy)   . Cataract    "beginnings of"  . Chronic kidney disease    kidney infection  . Depression   . GERD (gastroesophageal reflux disease)   . Hypertension   . Meralgia paresthetica of right side     Past Surgical History:  Procedure Laterality Date  . HERNIA REPAIR     inguinal, umbilical  . SPLENECTOMY      There were no vitals filed for this visit.  Subjective Assessment - 03/07/18 1310    Subjective  Pt. reporting R shoulder has been feeling well over the weekend.      Pertinent History  HTN    Diagnostic tests  MSK u/s: rotator cuff tear with retraction of 0.8 cm    Patient Stated Goals  improve pain and function    Currently in Pain?  No/denies    Pain Score  0-No pain    Multiple Pain Sites  No                       [facility] Adult PT Treatment/Exercise - 03/07/18 1311      Shoulder Exercises: Prone   Retraction  Right;Weights;15 reps    Retraction Weight (lbs)  2    Retraction Limitations  EOM; Cues for scapular retraction     Extension  15 reps    Extension Weight (lbs)  1    Extension Limitations   EOM    Horizontal ABduction 1  Right;10 reps;Weights    Horizontal ABduction 1 Weight (lbs)  2    Horizontal ABduction 1 Limitations  EOM; Cues required for scapular squeeze       Shoulder Exercises: Sidelying   External Rotation  Right;10 reps;Weights    External Rotation Weight (lbs)  1    External Rotation Limitations  therapist assistance on concentric; 3" eccentric       Shoulder Exercises: Standing   Flexion  Both;12 reps;Strengthening;Weights    Shoulder Flexion Weight (lbs)  1    Flexion Limitations  leaning on 1/2 foam bolster  mirror feedback for visual to prevent shoulder "hike"    ABduction  Both;12 reps;Strengthening;Weights    Shoulder ABduction Weight (lbs)  1    ABduction Limitations  leaning on 1/2 foam bolster  mirror feedback for visual to prevent shoulder "hike"    Extension  20 reps;Theraband;Both;Strengthening    Theraband Level (Shoulder Extension)  Level 2 (Red)      Shoulder Exercises: ROM/Strengthening  UBE (Upper Arm Bike)  Lvl: 2.0, 3 min forward/backwards     Cybex Row  15 reps    Cybex Row Limitations  20#      Shoulder Exercises: Stretch   Internal Rotation Stretch  5 reps 10 sec - towel behind back       Manual Therapy   Manual Therapy  Soft tissue mobilization;Myofascial release    Manual therapy comments  L sidelying     Soft tissue mobilization  STM to R shoulder complex, R proximal biceps; ttp at R infra and R proximal biceps     Myofascial Release  TPR to R infra, proximal biceps; ttp in these areas              PT Education - 03/07/18 1514    Education provided  Yes    Education Details  HEP update    Person(s) Educated  Patient    Methods  Explanation;Demonstration;Verbal cues;Handout    Comprehension  Verbalized understanding;Returned demonstration;Verbal cues required;Need further instruction          PT Long Term Goals - 02/21/18 1313      PT LONG TERM GOAL #1   Title  patient to be independent with advanced HEP     Status  On-going      PT LONG TERM GOAL #2   Title  patient to improve R shoulder AROM to WNL without pain    Status  On-going      PT LONG TERM GOAL #3   Title  patient to improve R shoulder strength to >/= 4/5 without pain    Status  On-going      PT LONG TERM GOAL #4   Title  patient to demonstrate appropriate posture and body mechanics as it relates to his daily activities    Status  On-going            Plan - 03/07/18 1314    Clinical Impression Statement  Jeffrey [name] reporting improvement in R shoulder comfort and able to tolerate housework and "scrubbing bathrooms" over weekend without increased pain.  Tolerating progression in RTC/scapular strengthening activities today well and able to demo improved control with isometric ER band step-out.  Reports good improvement in shoulder function since starting therapy, no longer feeling "throbbing" pain at nighttime.  Progressing well.      PT Treatment/Interventions  ADLs/Self Care Home Management;Cryotherapy;Electrical Stimulation;Iontophoresis 4mg /ml Dexamethasone;Moist Heat;Therapeutic exercise;Therapeutic activities;Neuromuscular re-education;Patient/family education;Manual techniques;Vasopneumatic Device;Taping;Dry needling;Passive range of motion    Consulted and Agree with Plan of Care  Patient       Patient will benefit from skilled therapeutic intervention in order to improve the following deficits and impairments:  Pain, Impaired UE functional use, Decreased strength, Decreased range of motion, Decreased activity tolerance  Visit Diagnosis: Chronic right shoulder pain  Muscle weakness (generalized)  Abnormal posture  Other symptoms and signs involving the musculoskeletal system     Problem List Patient Active Problem List   Diagnosis Date Noted  . Right rotator cuff tear 01/13/2018  . Abdominal pain 03/18/2016  . Cervical disc disorder with radiculopathy of cervical region 12/06/2015  . Edema 12/06/2015  . Lower  back pain 07/04/2015  . Injury of popliteal region 12/07/2014  . Right knee pain 11/13/2013  . Pneumonia 12/21/2012  . Fatigue 12/21/2012  . Dry mouth 12/21/2012  . Actinic keratoses 09/09/2012  . Well adult exam 06/25/2011  . Post-splenectomy 06/25/2011  . PSA elevation 06/25/2011  . VENTRAL HERNIA 05/23/2010  . Meralgia paresthetica 03/19/2009  .  Anxiety state 06/25/2007  . ERECTILE DYSFUNCTION 06/25/2007  . DEPRESSION 06/25/2007  . Essential hypertension 06/25/2007  . GERD 06/25/2007  . BPH (benign prostatic hyperplasia) 06/25/2007    Bess Harvest, PTA 03/07/18 3:14 PM  [facility] [address]  [place], [place], [address] Phone: [phone]   Fax:  [phone]  Name: Jeffrey [name] MRN: [id number] Date of Birth: [birth year]

## 2018-03-09 ENCOUNTER — Encounter: Payer: Self-pay | Admitting: Physical Therapy

## 2018-03-09 ENCOUNTER — Ambulatory Visit: Payer: Medicare Other | Admitting: Physical Therapy

## 2018-03-09 DIAGNOSIS — M25511 Pain in right shoulder: Principal | ICD-10-CM

## 2018-03-09 DIAGNOSIS — R293 Abnormal posture: Secondary | ICD-10-CM

## 2018-03-09 DIAGNOSIS — R29898 Other symptoms and signs involving the musculoskeletal system: Secondary | ICD-10-CM | POA: Diagnosis not present

## 2018-03-09 DIAGNOSIS — G8929 Other chronic pain: Secondary | ICD-10-CM | POA: Diagnosis not present

## 2018-03-09 DIAGNOSIS — M6281 Muscle weakness (generalized): Secondary | ICD-10-CM

## 2018-03-09 NOTE — Therapy (Signed)
[facility] [address]  [place], [place], [address] Phone: [phone]   Fax:  [phone]  Physical Therapy Treatment  Patient Details  Name: Jeffrey [name] MRN: [id number] Date of Birth: January 25, 1951 Referring Provider: Dr. Hulan Saas   Encounter Date: 03/09/2018  PT End of Session - 03/09/18 1309    Visit Number  7    Number of Visits  12    Date for PT Re-Evaluation  03/29/18    Authorization Type  Medicare    PT Start Time  1307    PT Stop Time  1346    PT Time Calculation (min)  39 min    Activity Tolerance  Patient tolerated treatment well    Behavior During Therapy  [facility] for tasks assessed/performed       Past Medical History:  Diagnosis Date  . Anxiety   . Arthritis    knees  . Blood transfusion without reported diagnosis   . BPH (benign prostatic hypertrophy)   . Cataract    "beginnings of"  . Chronic kidney disease    kidney infection  . Depression   . GERD (gastroesophageal reflux disease)   . Hypertension   . Meralgia paresthetica of right side     Past Surgical History:  Procedure Laterality Date  . HERNIA REPAIR     inguinal, umbilical  . SPLENECTOMY      There were no vitals filed for this visit.  Subjective Assessment - 03/09/18 1309    Subjective  "Oh yeah, I can tell its getting better"    Pertinent History  HTN    Diagnostic tests  MSK u/s: rotator cuff tear with retraction of 0.8 cm    Patient Stated Goals  improve pain and function    Currently in Pain?  No/denies    Pain Score  0-No pain                       [facility] Adult PT Treatment/Exercise - 03/09/18 1310      Shoulder Exercises: Standing   External Rotation  Strengthening;Right;15 reps;Theraband    Theraband Level (Shoulder External Rotation)  Level 1 (Yellow)    External Rotation Limitations  small movements    Internal Rotation  Strengthening;Right;15 reps;Theraband    Theraband Level  (Shoulder Internal Rotation)  Level 2 (Red)    Internal Rotation Limitations  good motion    Row  Strengthening;Both;Theraband;15 reps    Theraband Level (Shoulder Row)  Level 3 (Green)      Shoulder Exercises: ROM/Strengthening   UBE (Upper Arm Bike)  L3 x 6 min (3/3)    Wall Wash  abduction - R UE - 1# x 10    X to V Arms  attempted - unable due to pain    Ball on Wall  walking orange pball up wall - 1# at R wrist x 15    Rhythmic Stabilization, Seated  R UE - green medball - Cw x 15, CCW x 15, A-Z    Other ROM/Strengthening Exercises  PNF D1/D2 flexion/extension - yellow tband x 10 - D2 flexion no resistance                  PT Long Term Goals - 02/21/18 1313      PT LONG TERM GOAL #1   Title  patient to be independent with advanced HEP    Status  On-going      PT  LONG TERM GOAL #2   Title  patient to improve R shoulder AROM to WNL without pain    Status  On-going      PT LONG TERM GOAL #3   Title  patient to improve R shoulder strength to >/= 4/5 without pain    Status  On-going      PT LONG TERM GOAL #4   Title  patient to demonstrate appropriate posture and body mechanics as it relates to his daily activities    Status  On-going            Plan - 03/09/18 [place] doing really well - reporting good improvements in pain symptoms and general functional use of R UE. Tolerable to all strengthening/ROM progressions today without issue. Fatigue noted at end of session, as expected - some shoulder hiking requiring VC to reduce this compensation. Will continue to progress towards goals.     PT Treatment/Interventions  ADLs/Self Care Home Management;Cryotherapy;Electrical Stimulation;Iontophoresis 4mg /ml Dexamethasone;Moist Heat;Therapeutic exercise;Therapeutic activities;Neuromuscular re-education;Patient/family education;Manual techniques;Vasopneumatic Device;Taping;Dry needling;Passive range of motion    Consulted and Agree with  Plan of Care  Patient       Patient will benefit from skilled therapeutic intervention in order to improve the following deficits and impairments:  Pain, Impaired UE functional use, Decreased strength, Decreased range of motion, Decreased activity tolerance  Visit Diagnosis: Chronic right shoulder pain  Muscle weakness (generalized)  Abnormal posture  Other symptoms and signs involving the musculoskeletal system     Problem List Patient Active Problem List   Diagnosis Date Noted  . Right rotator cuff tear 01/13/2018  . Abdominal pain 03/18/2016  . Cervical disc disorder with radiculopathy of cervical region 12/06/2015  . Edema 12/06/2015  . Lower back pain 07/04/2015  . Injury of popliteal region 12/07/2014  . Right knee pain 11/13/2013  . Pneumonia 12/21/2012  . Fatigue 12/21/2012  . Dry mouth 12/21/2012  . Actinic keratoses 09/09/2012  . Well adult exam 06/25/2011  . Post-splenectomy 06/25/2011  . PSA elevation 06/25/2011  . VENTRAL HERNIA 05/23/2010  . Meralgia paresthetica 03/19/2009  . Anxiety state 06/25/2007  . ERECTILE DYSFUNCTION 06/25/2007  . DEPRESSION 06/25/2007  . Essential hypertension 06/25/2007  . GERD 06/25/2007  . BPH (benign prostatic hyperplasia) 06/25/2007    Lanney Gins, PT, DPT 03/09/18 1:48 PM   [facility] [address]  [place], [place], [address] Phone: [phone]   Fax:  [phone]  Name: Jeffrey [name] MRN: [id number] Date of Birth: 11-Dec-1950

## 2018-03-14 ENCOUNTER — Ambulatory Visit: Payer: Medicare Other

## 2018-03-14 DIAGNOSIS — R293 Abnormal posture: Secondary | ICD-10-CM

## 2018-03-14 DIAGNOSIS — R29898 Other symptoms and signs involving the musculoskeletal system: Secondary | ICD-10-CM

## 2018-03-14 DIAGNOSIS — M6281 Muscle weakness (generalized): Secondary | ICD-10-CM | POA: Diagnosis not present

## 2018-03-14 DIAGNOSIS — G8929 Other chronic pain: Secondary | ICD-10-CM | POA: Diagnosis not present

## 2018-03-14 DIAGNOSIS — M25511 Pain in right shoulder: Principal | ICD-10-CM

## 2018-03-14 NOTE — Therapy (Signed)
[facility] [address]  [place], [place], [address] Phone: [phone]   Fax:  [phone]  Physical Therapy Treatment  Patient Details  Name: Jeffrey [name] MRN: [id number] Date of Birth: 02-02-1951 Referring Provider: Dr. Hulan Saas   Encounter Date: 03/14/2018  PT End of Session - 03/14/18 1314    Visit Number  8    Number of Visits  12    Date for PT Re-Evaluation  03/29/18    Authorization Type  Medicare    PT Start Time  1310    PT Stop Time  1352    PT Time Calculation (min)  42 min    Activity Tolerance  Patient tolerated treatment well    Behavior During Therapy  [facility] for tasks assessed/performed       Past Medical History:  Diagnosis Date  . Anxiety   . Arthritis    knees  . Blood transfusion without reported diagnosis   . BPH (benign prostatic hypertrophy)   . Cataract    "beginnings of"  . Chronic kidney disease    kidney infection  . Depression   . GERD (gastroesophageal reflux disease)   . Hypertension   . Meralgia paresthetica of right side     Past Surgical History:  Procedure Laterality Date  . HERNIA REPAIR     inguinal, umbilical  . SPLENECTOMY      There were no vitals filed for this visit.  Subjective Assessment - 03/14/18 1314    Subjective  Pt. doing well today reporting, "I only had to use ice once over weekend because it's been feeling better".      Pertinent History  HTN    Diagnostic tests  MSK u/s: rotator cuff tear with retraction of 0.8 cm    Patient Stated Goals  improve pain and function    Currently in Pain?  No/denies    Pain Score  0-No pain    Multiple Pain Sites  No                       [facility] Adult PT Treatment/Exercise - 03/14/18 1316      Shoulder Exercises: Sidelying   External Rotation  15 reps    External Rotation Weight (lbs)  1    External Rotation Limitations  small movements     ABduction  Right;15 reps attempted with  1# however difficulty controlling       Shoulder Exercises: Standing   Flexion  Both;15 reps    Flexion Limitations  with long yellow TB     Extension  20 reps;Theraband;Both;Strengthening    Theraband Level (Shoulder Extension)  Level 2 (Red)    Row  Strengthening;Both;Theraband;15 reps    Theraband Level (Shoulder Row)  Level 3 (Green)      Shoulder Exercises: ROM/Strengthening   UBE (Upper Arm Bike)  L3 x 6 min (3/3)    Lat Pull  15 reps reviewed for use at gym     Lat Pull Limitations  20#    Cybex Row  15 reps 3" hold - reviewed for use at gym    Cybex Row Limitations  20#    Other ROM/Strengthening Exercises  R BATCA single arm cable row 10# x 15 reps              PT Education - 03/14/18 1355    Education provided  Yes    Education Details  HEP update for  machine strengthening to focus on gym     Person(s) Educated  Patient    Methods  Explanation;Demonstration;Verbal cues;Handout    Comprehension  Verbalized understanding;Returned demonstration;Verbal cues required;Need further instruction          PT Long Term Goals - 02/21/18 1313      PT LONG TERM GOAL #1   Title  patient to be independent with advanced HEP    Status  On-going      PT LONG TERM GOAL #2   Title  patient to improve R shoulder AROM to WNL without pain    Status  On-going      PT LONG TERM GOAL #3   Title  patient to improve R shoulder strength to >/= 4/5 without pain    Status  On-going      PT LONG TERM GOAL #4   Title  patient to demonstrate appropriate posture and body mechanics as it relates to his daily activities    Status  On-going            Plan - 03/14/18 1831    Clinical Impression Statement  Ronalee Belts reporting he had some muscular fatigue at R shoulder after last visit however felt fine.  Tolerated continued focus on overhead strengthening and ER strengthening well today.  Does still require close supervision to prevent substitutions and promote proper movement patterns.   Progressing well toward goals.        PT Treatment/Interventions  ADLs/Self Care Home Management;Cryotherapy;Electrical Stimulation;Iontophoresis 4mg /ml Dexamethasone;Moist Heat;Therapeutic exercise;Therapeutic activities;Neuromuscular re-education;Patient/family education;Manual techniques;Vasopneumatic Device;Taping;Dry needling;Passive range of motion    Consulted and Agree with Plan of Care  Patient       Patient will benefit from skilled therapeutic intervention in order to improve the following deficits and impairments:  Pain, Impaired UE functional use, Decreased strength, Decreased range of motion, Decreased activity tolerance  Visit Diagnosis: Chronic right shoulder pain  Muscle weakness (generalized)  Abnormal posture  Other symptoms and signs involving the musculoskeletal system     Problem List Patient Active Problem List   Diagnosis Date Noted  . Right rotator cuff tear 01/13/2018  . Abdominal pain 03/18/2016  . Cervical disc disorder with radiculopathy of cervical region 12/06/2015  . Edema 12/06/2015  . Lower back pain 07/04/2015  . Injury of popliteal region 12/07/2014  . Right knee pain 11/13/2013  . Pneumonia 12/21/2012  . Fatigue 12/21/2012  . Dry mouth 12/21/2012  . Actinic keratoses 09/09/2012  . Well adult exam 06/25/2011  . Post-splenectomy 06/25/2011  . PSA elevation 06/25/2011  . VENTRAL HERNIA 05/23/2010  . Meralgia paresthetica 03/19/2009  . Anxiety state 06/25/2007  . ERECTILE DYSFUNCTION 06/25/2007  . DEPRESSION 06/25/2007  . Essential hypertension 06/25/2007  . GERD 06/25/2007  . BPH (benign prostatic hyperplasia) 06/25/2007    Bess Harvest, PTA 03/14/18 6:34 PM  [facility] [address]  [place], [place], [address] Phone: [phone]   Fax:  [phone]  Name: KAYLUB DETIENNE MRN: [id number] Date of Birth: [birth year]

## 2018-03-16 ENCOUNTER — Encounter: Payer: Self-pay | Admitting: Physical Therapy

## 2018-03-16 ENCOUNTER — Ambulatory Visit: Payer: Medicare Other | Admitting: Physical Therapy

## 2018-03-16 DIAGNOSIS — R293 Abnormal posture: Secondary | ICD-10-CM | POA: Diagnosis not present

## 2018-03-16 DIAGNOSIS — M25511 Pain in right shoulder: Principal | ICD-10-CM

## 2018-03-16 DIAGNOSIS — R29898 Other symptoms and signs involving the musculoskeletal system: Secondary | ICD-10-CM

## 2018-03-16 DIAGNOSIS — G8929 Other chronic pain: Secondary | ICD-10-CM | POA: Diagnosis not present

## 2018-03-16 DIAGNOSIS — M6281 Muscle weakness (generalized): Secondary | ICD-10-CM

## 2018-03-16 NOTE — Therapy (Signed)
[facility] [address]  [place], [place], [address] Phone: [phone]   Fax:  [phone]  Physical Therapy Treatment  Patient Details  Name: Jeffrey [name] MRN: [id number] Date of Birth: January 23, 1951 Referring Provider: Dr. Hulan Saas   Encounter Date: 03/16/2018  PT End of Session - 03/16/18 1311    Visit Number  9    Number of Visits  12    Date for PT Re-Evaluation  03/29/18    Authorization Type  Medicare    PT Start Time  1310    PT Stop Time  1353    PT Time Calculation (min)  43 min    Activity Tolerance  Patient tolerated treatment well    Behavior During Therapy  [facility] for tasks assessed/performed       Past Medical History:  Diagnosis Date  . Anxiety   . Arthritis    knees  . Blood transfusion without reported diagnosis   . BPH (benign prostatic hypertrophy)   . Cataract    "beginnings of"  . Chronic kidney disease    kidney infection  . Depression   . GERD (gastroesophageal reflux disease)   . Hypertension   . Meralgia paresthetica of right side     Past Surgical History:  Procedure Laterality Date  . HERNIA REPAIR     inguinal, umbilical  . SPLENECTOMY      There were no vitals filed for this visit.  Subjective Assessment - 03/16/18 1311    Subjective  doing well - some soreness this morning - thinks he slept on shoulder    Pertinent History  HTN    Diagnostic tests  MSK u/s: rotator cuff tear with retraction of 0.8 cm    Patient Stated Goals  improve pain and function    Currently in Pain?  Yes    Pain Score  4     Pain Location  Shoulder    Pain Orientation  Right    Pain Descriptors / Indicators  Sore;Aching;Nagging    Pain Type  Chronic pain                       [facility] Adult PT Treatment/Exercise - 03/16/18 1312      Shoulder Exercises: Standing   External Rotation  Strengthening;Right;15 reps;Theraband    Theraband Level (Shoulder External Rotation)   Level 2 (Red)    Internal Rotation  Strengthening;Right;15 reps;Theraband    Theraband Level (Shoulder Internal Rotation)  Level 2 (Red)      Shoulder Exercises: ROM/Strengthening   UBE (Upper Arm Bike)  L3 x 6 min (3/3)    Cybex Row  15 reps    Cybex Row Limitations  25# - narrow grip    Wall Wash  abduction - R UE - 2# x 10    Ball on Wall  walking orange pball up wall - 2# at R wrist x 15    Other ROM/Strengthening Exercises  R UE - cabinet reaches - flexion 2# x 15; abduction 2# x 15      Manual Therapy   Manual Therapy  Soft tissue mobilization;Passive ROM    Manual therapy comments  pt supine    Soft tissue mobilization  STM to R shoulder complex    Passive ROM  PROM R shoulder - all planes - some pain at end ranges of moton  PT Long Term Goals - 02/21/18 1313      PT LONG TERM GOAL #1   Title  patient to be independent with advanced HEP    Status  On-going      PT LONG TERM GOAL #2   Title  patient to improve R shoulder AROM to WNL without pain    Status  On-going      PT LONG TERM GOAL #3   Title  patient to improve R shoulder strength to >/= 4/5 without pain    Status  On-going      PT LONG TERM GOAL #4   Title  patient to demonstrate appropriate posture and body mechanics as it relates to his daily activities    Status  On-going            Plan - 03/16/18 1312    Clinical Impression Statement  Ronalee Belts doing well and feels like he has progressed well with ability to return to gym activities wihtout issue. Will plan 1 week break and follow-up next week to ensure good carryover and continued improvements in functional mobility of R UE. Hopeful to transition to HEP at that point if patient comfortable and able.     PT Treatment/Interventions  ADLs/Self Care Home Management;Cryotherapy;Electrical Stimulation;Iontophoresis 4mg /ml Dexamethasone;Moist Heat;Therapeutic exercise;Therapeutic activities;Neuromuscular re-education;Patient/family  education;Manual techniques;Vasopneumatic Device;Taping;Dry needling;Passive range of motion    Consulted and Agree with Plan of Care  Patient       Patient will benefit from skilled therapeutic intervention in order to improve the following deficits and impairments:  Pain, Impaired UE functional use, Decreased strength, Decreased range of motion, Decreased activity tolerance  Visit Diagnosis: Chronic right shoulder pain  Muscle weakness (generalized)  Abnormal posture  Other symptoms and signs involving the musculoskeletal system     Problem List Patient Active Problem List   Diagnosis Date Noted  . Right rotator cuff tear 01/13/2018  . Abdominal pain 03/18/2016  . Cervical disc disorder with radiculopathy of cervical region 12/06/2015  . Edema 12/06/2015  . Lower back pain 07/04/2015  . Injury of popliteal region 12/07/2014  . Right knee pain 11/13/2013  . Pneumonia 12/21/2012  . Fatigue 12/21/2012  . Dry mouth 12/21/2012  . Actinic keratoses 09/09/2012  . Well adult exam 06/25/2011  . Post-splenectomy 06/25/2011  . PSA elevation 06/25/2011  . VENTRAL HERNIA 05/23/2010  . Meralgia paresthetica 03/19/2009  . Anxiety state 06/25/2007  . ERECTILE DYSFUNCTION 06/25/2007  . DEPRESSION 06/25/2007  . Essential hypertension 06/25/2007  . GERD 06/25/2007  . BPH (benign prostatic hyperplasia) 06/25/2007     Lanney Gins, PT, DPT 03/16/18 2:03 PM   [name] [address]  [address] [place], [place], [address] Phone: [phone]   Fax:  [phone]  Name: ZAYON TRULSON MRN: [id number] Date of Birth: 22-Jul-1951

## 2018-03-23 ENCOUNTER — Ambulatory Visit: Payer: Medicare Other

## 2018-03-29 ENCOUNTER — Encounter: Payer: Self-pay | Admitting: Physical Therapy

## 2018-03-29 ENCOUNTER — Ambulatory Visit: Payer: Medicare Other | Attending: Family Medicine | Admitting: Physical Therapy

## 2018-03-29 DIAGNOSIS — R293 Abnormal posture: Secondary | ICD-10-CM

## 2018-03-29 DIAGNOSIS — R29898 Other symptoms and signs involving the musculoskeletal system: Secondary | ICD-10-CM

## 2018-03-29 DIAGNOSIS — M6281 Muscle weakness (generalized): Secondary | ICD-10-CM

## 2018-03-29 DIAGNOSIS — G8929 Other chronic pain: Secondary | ICD-10-CM | POA: Insufficient documentation

## 2018-03-29 DIAGNOSIS — M25511 Pain in right shoulder: Secondary | ICD-10-CM | POA: Insufficient documentation

## 2018-03-29 NOTE — Therapy (Signed)
[facility] [address]  [place], [place], [address] Phone: [phone]   Fax:  [phone]  Physical Therapy Discharge  Patient Details  Name: Jeffrey [name] MRN: [id number] Date of Birth: Dec 02, 1950 Referring Provider: Dr. Hulan Saas    Progress Note Reporting Period 02/15/18 to 03/29/18  See note below for Objective Data and Assessment of Progress/Goals.      Encounter Date: 03/29/2018  PT End of Session - 03/29/18 1212    Visit Number  10    Number of Visits  12    Date for PT Re-Evaluation  03/29/18    Authorization Type  Medicare    PT Start Time  0757    PT Stop Time  0843    PT Time Calculation (min)  46 min    Activity Tolerance  Patient tolerated treatment well    Behavior During Therapy  [facility] for tasks assessed/performed       Past Medical History:  Diagnosis Date  . Anxiety   . Arthritis    knees  . Blood transfusion without reported diagnosis   . BPH (benign prostatic hypertrophy)   . Cataract    "beginnings of"  . Chronic kidney disease    kidney infection  . Depression   . GERD (gastroesophageal reflux disease)   . Hypertension   . Meralgia paresthetica of right side     Past Surgical History:  Procedure Laterality Date  . HERNIA REPAIR     inguinal, umbilical  . SPLENECTOMY      There were no vitals filed for this visit.  Subjective Assessment - 03/29/18 0759    Subjective  Patient reports R shoulder is not too bad today. Reports compliance with HEP. Reports 70% improvement since inital eval.    Pertinent History  HTN    Diagnostic tests  MSK u/s: rotator cuff tear with retraction of 0.8 cm    Patient Stated Goals  improve pain and function    Currently in Pain?  No/denies         [facility] PT Assessment - 03/29/18 0805      Observation/Other Assessments   Focus on Therapeutic Outcomes (FOTO)   Shoulder: 74 (26% limited)      AROM   Right Shoulder Flexion  145  Degrees    Right Shoulder ABduction  160 Degrees    Right Shoulder Internal Rotation  -- L1; 85 degrees    Right Shoulder External Rotation  -- T3; 75 degrees      Strength   Right Shoulder Flexion  4+/5    Right Shoulder ABduction  4+/5    Right Shoulder Internal Rotation  4+/5    Right Shoulder External Rotation  4-/5                   [facility] Adult PT Treatment/Exercise - 03/29/18 0001      Shoulder Exercises: Seated   Flexion  AAROM;Both;10 reps;Limitations    Flexion Limitations  B UE flexion rolling orange pball on table    Other Seated Exercises  rhythmic stabilization; BE UEs overhead w/ yellow weighted ball; 2x30 sec w/ perturbations      Shoulder Exercises: Standing   External Rotation  Strengthening;Right;15 reps;Theraband    Theraband Level (Shoulder External Rotation)  Level 2 (Red)    External Rotation Limitations  2 sets    Internal Rotation  Strengthening;Right;15 reps;Theraband    Theraband Level (Shoulder Internal Rotation)  Level 2 (Red)  Internal Rotation Limitations  2 sets    Other Standing Exercises  B UE ER w/ red TB; 2x10      Shoulder Exercises: ROM/Strengthening   UBE (Upper Arm Bike)  L3 x 6 min (3/3)    Lat Pull  15 reps    Lat Pull Limitations  20#    Cybex Row Limitations  25# - narrow grip; 2x10      Shoulder Exercises: Stretch   Corner Stretch  2 reps;20 seconds    Corner Stretch Limitations  90 degrees in doorway; R UE      Manual Therapy   Manual Therapy  Soft tissue mobilization;Joint mobilization    Manual therapy comments  supine    Joint Mobilization  grade III L shoulder mobs in inferior and posterior directions    Soft tissue mobilization  R UT, supraspinatus, infraspinatus, bicep soft tissue restriction in bicep and UT             PT Education - 03/29/18 5035    Education provided  Yes    Education Details  Addition to HEP    Person(s) Educated  Patient    Methods  Explanation;Demonstration;Tactile  cues;Verbal cues;Handout    Comprehension  Verbalized understanding;Returned demonstration          PT Long Term Goals - 03/29/18 0803      PT LONG TERM GOAL #1   Title  patient to be independent with advanced HEP    Status  Achieved      PT LONG TERM GOAL #2   Title  patient to improve R shoulder AROM to WNL without pain    Status  Partially Met mildly limited in R shoulder flexion AROM      PT LONG TERM GOAL #3   Title  patient to improve R shoulder strength to >/= 4/5 without pain    Status  Partially Met mildly limited in R ER strength      PT LONG TERM GOAL #4   Title  patient to demonstrate appropriate posture and body mechanics as it relates to his daily activities    Status  Achieved            Plan - 03/29/18 1213    Clinical Impression Statement  Patient arrived to session with no new complaints. Reports he has progressed 70% since initial eval. Patient has met or partially met all goals at this time and reports he is ready to D/C. Patient has improved R shoulder strength significantly- still mild limitation in ER strength. Has also shown considerable improvements in R shoulder AROM- still mildly limited in R shoulder flexion. Patient reports he plans to continue HEP at home and use machines at the gym. Updated HEP this date and practiced using lat pull down and row machine, educating patient on safe use; reported understanding. Tolerated STM and joint mobs to L shoulder- mild soft tissue restriction noted in L bicep and UT but not TTP- patient reports improvement. Tolerated progressive scapular strengthening and ROM ther-ex this date without report of pain. Ended session without c/o pain. Patient to be D/C'd this date d/t meeting goals and patient's independence with symptom management.     PT Treatment/Interventions  ADLs/Self Care Home Management;Cryotherapy;Electrical Stimulation;Iontophoresis '4mg'$ /ml Dexamethasone;Moist Heat;Therapeutic exercise;Therapeutic  activities;Neuromuscular re-education;Patient/family education;Manual techniques;Vasopneumatic Device;Taping;Dry needling;Passive range of motion    PT Next Visit Plan  D/C'd at this time    Consulted and Agree with Plan of Care  Patient  Patient will benefit from skilled therapeutic intervention in order to improve the following deficits and impairments:  Pain, Impaired UE functional use, Decreased strength, Decreased range of motion, Decreased activity tolerance  Visit Diagnosis: Chronic right shoulder pain  Muscle weakness (generalized)  Abnormal posture  Other symptoms and signs involving the musculoskeletal system     Problem List Patient Active Problem List   Diagnosis Date Noted  . Right rotator cuff tear 01/13/2018  . Abdominal pain 03/18/2016  . Cervical disc disorder with radiculopathy of cervical region 12/06/2015  . Edema 12/06/2015  . Lower back pain 07/04/2015  . Injury of popliteal region 12/07/2014  . Right knee pain 11/13/2013  . Pneumonia 12/21/2012  . Fatigue 12/21/2012  . Dry mouth 12/21/2012  . Actinic keratoses 09/09/2012  . Well adult exam 06/25/2011  . Post-splenectomy 06/25/2011  . PSA elevation 06/25/2011  . VENTRAL HERNIA 05/23/2010  . Meralgia paresthetica 03/19/2009  . Anxiety state 06/25/2007  . ERECTILE DYSFUNCTION 06/25/2007  . DEPRESSION 06/25/2007  . Essential hypertension 06/25/2007  . GERD 06/25/2007  . BPH (benign prostatic hyperplasia) 06/25/2007      PHYSICAL THERAPY DISCHARGE SUMMARY  Visits from Start of Care: 10  Current functional level related to goals / functional outcomes: See above clinical assessment   Remaining deficits: Decreased R shoulder flexion AROM, decreased R shoulder ER strength   Education / Equipment: Educated on advanced HEP and using gym machines to increase R UE strength safely. Patient reported understanding. Plan: Patient agrees to discharge.  Patient goals were partially met. Patient  is being discharged due to being pleased with the current functional level.  ?????     Janene Harvey, PT, DPT 03/29/18 12:26 PM   [facility] [address]  [place], [place], [address] Phone: [phone]   Fax:  [phone]  Name: Jeffrey [name] MRN: [id number] Date of Birth: [birth year]

## 2018-04-12 ENCOUNTER — Ambulatory Visit: Payer: Medicare Other | Admitting: Internal Medicine

## 2018-04-12 ENCOUNTER — Ambulatory Visit: Payer: Medicare Other

## 2018-04-13 ENCOUNTER — Ambulatory Visit: Payer: Medicare Other

## 2018-04-14 ENCOUNTER — Ambulatory Visit (INDEPENDENT_AMBULATORY_CARE_PROVIDER_SITE_OTHER): Payer: Medicare Other | Admitting: Internal Medicine

## 2018-04-14 ENCOUNTER — Encounter: Payer: Self-pay | Admitting: Internal Medicine

## 2018-04-14 ENCOUNTER — Other Ambulatory Visit (INDEPENDENT_AMBULATORY_CARE_PROVIDER_SITE_OTHER): Payer: Medicare Other

## 2018-04-14 ENCOUNTER — Ambulatory Visit (INDEPENDENT_AMBULATORY_CARE_PROVIDER_SITE_OTHER): Payer: Medicare Other | Admitting: *Deleted

## 2018-04-14 VITALS — BP 154/72 | HR 62 | Temp 98.8°F | Resp 19 | Ht 72.0 in | Wt 252.0 lb

## 2018-04-14 VITALS — BP 154/72 | HR 62 | Temp 98.8°F | Resp 18 | Ht 72.0 in | Wt 252.0 lb

## 2018-04-14 DIAGNOSIS — Z0000 Encounter for general adult medical examination without abnormal findings: Secondary | ICD-10-CM

## 2018-04-14 DIAGNOSIS — N400 Enlarged prostate without lower urinary tract symptoms: Secondary | ICD-10-CM

## 2018-04-14 DIAGNOSIS — E785 Hyperlipidemia, unspecified: Secondary | ICD-10-CM

## 2018-04-14 DIAGNOSIS — F411 Generalized anxiety disorder: Secondary | ICD-10-CM

## 2018-04-14 DIAGNOSIS — Z9081 Acquired absence of spleen: Secondary | ICD-10-CM | POA: Diagnosis not present

## 2018-04-14 DIAGNOSIS — R972 Elevated prostate specific antigen [PSA]: Secondary | ICD-10-CM | POA: Diagnosis not present

## 2018-04-14 DIAGNOSIS — I700 Atherosclerosis of aorta: Secondary | ICD-10-CM

## 2018-04-14 DIAGNOSIS — I10 Essential (primary) hypertension: Secondary | ICD-10-CM

## 2018-04-14 DIAGNOSIS — Z23 Encounter for immunization: Secondary | ICD-10-CM | POA: Diagnosis not present

## 2018-04-14 LAB — CBC WITH DIFFERENTIAL/PLATELET
BASOS PCT: 1.3 % (ref 0.0–3.0)
Basophils Absolute: 0.1 10*3/uL (ref 0.0–0.1)
Eosinophils Absolute: 0.1 10*3/uL (ref 0.0–0.7)
Eosinophils Relative: 1.5 % (ref 0.0–5.0)
HEMATOCRIT: 44.9 % (ref 39.0–52.0)
HEMOGLOBIN: 15.1 g/dL (ref 13.0–17.0)
LYMPHS PCT: 26.9 % (ref 12.0–46.0)
Lymphs Abs: 2.5 10*3/uL (ref 0.7–4.0)
MCHC: 33.6 g/dL (ref 30.0–36.0)
MCV: 96.4 fl (ref 78.0–100.0)
MONOS PCT: 12.6 % — AB (ref 3.0–12.0)
Monocytes Absolute: 1.2 10*3/uL — ABNORMAL HIGH (ref 0.1–1.0)
Neutro Abs: 5.4 10*3/uL (ref 1.4–7.7)
Neutrophils Relative %: 57.7 % (ref 43.0–77.0)
Platelets: 305 10*3/uL (ref 150.0–400.0)
RBC: 4.66 Mil/uL (ref 4.22–5.81)
RDW: 14.2 % (ref 11.5–15.5)
WBC: 9.3 10*3/uL (ref 4.0–10.5)

## 2018-04-14 LAB — BASIC METABOLIC PANEL
BUN: 11 mg/dL (ref 6–23)
CALCIUM: 9.3 mg/dL (ref 8.4–10.5)
CO2: 35 mEq/L — ABNORMAL HIGH (ref 19–32)
CREATININE: 0.9 mg/dL (ref 0.40–1.50)
Chloride: 99 mEq/L (ref 96–112)
GFR: 89.13 mL/min (ref >60.00)
Glucose, Bld: 92 mg/dL (ref 70–99)
Potassium: 3.9 mEq/L (ref 3.5–5.1)
Sodium: 141 mEq/L (ref 135–145)

## 2018-04-14 LAB — URINALYSIS
Bilirubin Urine: NEGATIVE
Hgb urine dipstick: NEGATIVE
Ketones, ur: NEGATIVE
LEUKOCYTES UA: NEGATIVE
Nitrite: NEGATIVE
SPECIFIC GRAVITY, URINE: 1.02 (ref 1.000–1.030)
Total Protein, Urine: NEGATIVE
URINE GLUCOSE: NEGATIVE
Urobilinogen, UA: 0.2 (ref 0.0–1.0)
pH: 6.5 (ref 5.0–8.0)

## 2018-04-14 LAB — LIPID PANEL
CHOLESTEROL: 183 mg/dL (ref 0–200)
HDL: 55.5 mg/dL (ref >39.00)
LDL CALC: 111 mg/dL — AB (ref 0–99)
NonHDL: 127.14
Total CHOL/HDL Ratio: 3
Triglycerides: 83 mg/dL (ref 0.0–149.0)
VLDL: 16.6 mg/dL (ref 0.0–40.0)

## 2018-04-14 LAB — HEPATIC FUNCTION PANEL
ALBUMIN: 4 g/dL (ref 3.5–5.2)
ALK PHOS: 68 U/L (ref 39–117)
ALT: 11 U/L (ref 0–53)
AST: 14 U/L (ref 0–37)
BILIRUBIN DIRECT: 0.1 mg/dL (ref 0.0–0.3)
Total Bilirubin: 0.7 mg/dL (ref 0.2–1.2)
Total Protein: 7 g/dL (ref 6.0–8.3)

## 2018-04-14 LAB — TSH: TSH: 1.84 u[IU]/mL (ref 0.35–4.50)

## 2018-04-14 MED ORDER — AMOXICILLIN-POT CLAVULANATE 875-125 MG PO TABS: 1.0000 | ORAL_TABLET | Freq: Two times a day (BID) | ORAL | 0 refills | Status: DC

## 2018-04-14 MED ORDER — ICOSAPENT ETHYL 1 G PO CAPS: 2.0000 | ORAL_CAPSULE | Freq: Two times a day (BID) | ORAL | 11 refills | Status: DC

## 2018-04-14 MED ORDER — FINASTERIDE 5 MG PO TABS: 5.0000 mg | ORAL_TABLET | Freq: Every day | ORAL | 3 refills | Status: DC

## 2018-04-14 MED ORDER — TAMSULOSIN HCL 0.4 MG PO CAPS: 0.4000 mg | ORAL_CAPSULE | Freq: Every day | ORAL | 3 refills | Status: DC

## 2018-04-14 MED ORDER — QUINAPRIL-HYDROCHLOROTHIAZIDE 20-12.5 MG PO TABS: 2.0000 | ORAL_TABLET | Freq: Every day | ORAL | 3 refills | Status: DC

## 2018-04-14 NOTE — Assessment & Plan Note (Signed)
Flomax, Finasteride qod

## 2018-04-14 NOTE — Assessment & Plan Note (Signed)
Pneumovax Augmentin prn

## 2018-04-14 NOTE — Patient Instructions (Addendum)
Continue doing brain stimulating activities (puzzles, reading, adult coloring books, staying active) to keep memory sharp.   Continue to eat heart healthy diet (full of fruits, vegetables, whole grains, lean protein, water--limit salt, fat, and sugar intake) and increase physical activity as tolerated.  Jeffrey [name] , Thank you for taking time to come for your Medicare Wellness Visit. I appreciate your ongoing commitment to your health goals. Please review the following plan we discussed and let me know if I can assist you in the future.   These are the goals we discussed: Goals    . Patient Stated     I want to lose weight by monitoring how much carbohydrates I eat, starting to go to the gym and exercise.        This is a list of the screening recommended for you and due dates:  Health Maintenance  Topic Date Due  . Pneumonia vaccines (2 of 2 - PPSV23) 12/05/2016  . Flu Shot  05/26/2018  . Tetanus Vaccine  12/07/2023  . Colon Cancer Screening  08/26/2025  .  Hepatitis C: One time screening is recommended by Center for Disease Control  (CDC) for  adults born from [birth year] through 1966.   Completed    Health Maintenance, Male A healthy lifestyle and preventive care is important for your health and wellness. Ask your health care provider about what schedule of regular examinations is right for you. What should I know about weight and diet? Eat a Healthy Diet  Eat plenty of vegetables, fruits, whole grains, low-fat dairy products, and lean protein.  Do not eat a lot of foods high in solid fats, added sugars, or salt.  Maintain a Healthy Weight Regular exercise can help you achieve or maintain a healthy weight. You should:  Do at least 150 minutes of exercise each week. The exercise should increase your heart rate and make you sweat (moderate-intensity exercise).  Do strength-training exercises at least twice a week.  Watch Your Levels of Cholesterol and Blood Lipids  Have your blood  tested for lipids and cholesterol every 5 years starting at 68 years of age. If you are at high risk for heart disease, you should start having your blood tested when you are 68 years old. You may need to have your cholesterol levels checked more often if: ? Your lipid or cholesterol levels are high. ? You are older than 68 years of age. ? You are at high risk for heart disease.  What should I know about cancer screening? Many types of cancers can be detected early and may often be prevented. Lung Cancer  You should be screened every year for lung cancer if: ? You are a current smoker who has smoked for at least 30 years. ? You are a former smoker who has quit within the past 15 years.  Talk to your health care provider about your screening options, when you should start screening, and how often you should be screened.  Colorectal Cancer  Routine colorectal cancer screening usually begins at 68 years of age and should be repeated every 5-10 years until you are 68 years old. You may need to be screened more often if early forms of precancerous polyps or small growths are found. Your health care provider may recommend screening at an earlier age if you have risk factors for colon cancer.  Your health care provider may recommend using home test kits to check for hidden blood in the stool.  A small camera at  the end of a tube can be used to examine your colon (sigmoidoscopy or colonoscopy). This checks for the earliest forms of colorectal cancer.  Prostate and Testicular Cancer  Depending on your age and overall health, your health care provider may do certain tests to screen for prostate and testicular cancer.  Talk to your health care provider about any symptoms or concerns you have about testicular or prostate cancer.  Skin Cancer  Check your skin from head to toe regularly.  Tell your health care provider about any new moles or changes in moles, especially if: ? There is a change in  a mole's size, shape, or color. ? You have a mole that is larger than a pencil eraser.  Always use sunscreen. Apply sunscreen liberally and repeat throughout the day.  Protect yourself by wearing long sleeves, pants, a wide-brimmed hat, and sunglasses when outside.  What should I know about heart disease, diabetes, and high blood pressure?  If you are 35-53 years of age, have your blood pressure checked every 3-5 years. If you are 62 years of age or older, have your blood pressure checked every year. You should have your blood pressure measured twice-once when you are at a hospital or clinic, and once when you are not at a hospital or clinic. Record the average of the two measurements. To check your blood pressure when you are not at a hospital or clinic, you can use: ? An automated blood pressure machine at a pharmacy. ? A home blood pressure monitor.  Talk to your health care provider about your target blood pressure.  If you are between 67-99 years old, ask your health care provider if you should take aspirin to prevent heart disease.  Have regular diabetes screenings by checking your fasting blood sugar level. ? If you are at a normal weight and have a low risk for diabetes, have this test once every three years after the age of 87. ? If you are overweight and have a high risk for diabetes, consider being tested at a younger age or more often.  A one-time screening for abdominal aortic aneurysm (AAA) by ultrasound is recommended for men aged 38-75 years who are current or former smokers. What should I know about preventing infection? Hepatitis B If you have a higher risk for hepatitis B, you should be screened for this virus. Talk with your health care provider to find out if you are at risk for hepatitis B infection. Hepatitis C Blood testing is recommended for:  Everyone born from [birth year] through 1966.  Anyone with known risk factors for hepatitis C.  Sexually Transmitted Diseases  (STDs)  You should be screened each year for STDs including gonorrhea and chlamydia if: ? You are sexually active and are younger than 68 years of age. ? You are older than 68 years of age and your health care provider tells you that you are at risk for this type of infection. ? Your sexual activity has changed since you were last screened and you are at an increased risk for chlamydia or gonorrhea. Ask your health care provider if you are at risk.  Talk with your health care provider about whether you are at high risk of being infected with HIV. Your health care provider may recommend a prescription medicine to help prevent HIV infection.  What else can I do?  Schedule regular health, dental, and eye exams.  Stay current with your vaccines (immunizations).  Do not use any tobacco products, such  as cigarettes, chewing tobacco, and e-cigarettes. If you need help quitting, ask your health care provider.  Limit alcohol intake to no more than 2 drinks per day. One drink equals 12 ounces of beer, 5 ounces of Schae Cando, or 1 ounces of hard liquor.  Do not use street drugs.  Do not share needles.  Ask your health care provider for help if you need support or information about quitting drugs.  Tell your health care provider if you often feel depressed.  Tell your health care provider if you have ever been abused or do not feel safe at home. This information is not intended to replace advice given to you by your health care provider. Make sure you discuss any questions you have with your health care provider. Document Released: 04/09/2008 Document Revised: 06/10/2016 Document Reviewed: 07/16/2015 Elsevier Interactive Patient Education  [employer].

## 2018-04-14 NOTE — Assessment & Plan Note (Signed)
Doing well 

## 2018-04-14 NOTE — Assessment & Plan Note (Signed)
Discussed CXR findings Statin intolerant ASA BP meds Vascepa

## 2018-04-14 NOTE — Progress Notes (Addendum)
Subjective:   Jeffrey [name] is a 67 y.o. male who presents for Medicare Annual/Subsequent preventive examination.  Review of Systems:  No ROS.  Medicare Wellness Visit. Additional risk factors are reflected in the social history.  Cardiac Risk Factors include: hypertension;male gender;microalbuminuria Sleep patterns: feels rested on waking, gets up 2 times nightly to void and sleeps 5-6  hours nightly.   Home Safety/Smoke Alarms: Feels safe in home. Smoke alarms in place.  Living environment; residence and Firearm Safety: 1-story house/ trailer, no firearms. Lives with wife, no needs for DME, good support system Seat Belt Safety/Bike Helmet: Wears seat belt.     Objective:    Vitals: BP (!) 154/72   Pulse 62   Temp 98.8 F (37.1 C)   Resp 19   Ht 6' (1.829 m)   Wt 252 lb (114.3 kg)   SpO2 98%   BMI 34.18 kg/m   Body mass index is 34.18 kg/m.  Advanced Directives 04/14/2018 02/15/2018 03/10/2017 08/13/2015 12/01/2014  Does Patient Have a Medical Advance Directive? Yes Yes No No;Yes No  Type of Paramedic of [place];Living will - - Living will -  Does patient want to make changes to medical advance directive? - No - Patient declined - - -  Copy of [place] in Chart? No - copy requested - - - -  Would patient like information on creating a medical advance directive? - - Yes (ED - Information included in AVS) - No - patient declined information    Tobacco Social History   Tobacco Use  Smoking Status Never Smoker  Smokeless Tobacco Never Used     Counseling given: Not Answered  Past Medical History:  Diagnosis Date  . Anxiety   . Arthritis    knees  . Blood transfusion without reported diagnosis   . BPH (benign prostatic hypertrophy)   . Cataract    "beginnings of"  . Chronic kidney disease    kidney infection  . Depression   . GERD (gastroesophageal reflux disease)   . Hypertension   . Meralgia paresthetica of right  side    Past Surgical History:  Procedure Laterality Date  . HERNIA REPAIR     inguinal, umbilical  . SPLENECTOMY     Family History  Problem Relation Age of Onset  . Heart disease Mother 87       chf  . Hypertension Other   . Colon cancer Neg Hx   . Esophageal cancer Neg Hx   . Stomach cancer Neg Hx   . Rectal cancer Neg Hx    Social History   Socioeconomic History  . Marital status: Married    Spouse name: Not on file  . Number of children: 2  . Years of education: Not on file  . Highest education level: Not on file  Occupational History  . Not on file  Social Needs  . Financial resource strain: Not hard at all  . Food insecurity:    Worry: Never true    Inability: Never true  . Transportation needs:    Medical: No    Non-medical: No  Tobacco Use  . Smoking status: Never Smoker  . Smokeless tobacco: Never Used  Substance and Sexual Activity  . Alcohol use: Yes    Alcohol/week: 0.0 oz    Comment: occasional Alwyn Cordner  . Drug use: No  . Sexual activity: Yes  Lifestyle  . Physical activity:    Days per week: 0 days  Minutes per session: 0 min  . Stress: Only a little  Relationships  . Social connections:    Talks on phone: More than three times a week    Gets together: More than three times a week    Attends religious service: More than 4 times per year    Active member of club or organization: Yes    Attends meetings of clubs or organizations: More than 4 times per year    Relationship status: Married  Other Topics Concern  . Not on file  Social History Narrative   Regular exercise-No    Outpatient Encounter Medications as of 04/14/2018  Medication Sig  . Ascorbic Acid (VITAMIN C) 100 MG tablet Take 1 tablet by mouth daily.  Marland Kitchen aspirin 81 MG tablet Take 81 mg by mouth daily.    . Cholecalciferol (VITAMIN D3) 2000 units TABS Take 1 tablet by mouth.  . cyclobenzaprine (FLEXERIL) 5 MG tablet Take 1 tablet (5 mg total) 3 (three) times daily as needed by  mouth for muscle spasms.  Marland Kitchen omeprazole (PRILOSEC) 20 MG capsule Take 20 mg by mouth daily.  . sildenafil (REVATIO) 20 MG tablet Take 1-5 tablets (20-100 mg total) by mouth daily as needed. For ED  . [DISCONTINUED] finasteride (PROSCAR) 5 MG tablet Take 1 tablet (5 mg total) daily by mouth. Overdue for yearly physical w/labs must see MF for refills  . [DISCONTINUED] quinapril-hydrochlorothiazide (ACCURETIC) 20-12.5 MG tablet Take 2 tablets daily by mouth.  . [DISCONTINUED] tamsulosin (FLOMAX) 0.4 MG CAPS capsule Take 1 capsule (0.4 mg total) daily by mouth. Must keep schedule appt for future refills   No facility-administered encounter medications on file as of 04/14/2018.     Activities of Daily Living In your present state of health, do you have any difficulty performing the following activities: 04/14/2018  Hearing? N  Vision? N  Difficulty concentrating or making decisions? N  Walking or climbing stairs? N  Dressing or bathing? N  Doing errands, shopping? N  Preparing Food and eating ? N  Using the Toilet? N  In the past six months, have you accidently leaked urine? N  Do you have problems with loss of bowel control? N  Managing your Medications? N  Managing your Finances? N  Housekeeping or managing your Housekeeping? N  Some recent data might be hidden    Patient Care Team: Plotnikov, Evie Lacks, MD as PCP - General Franchot Gallo, MD as Attending Physician (Urology)   Assessment:   This is a routine wellness examination for Jeffrey [name]. Physical assessment deferred to PCP.   Exercise Activities and Dietary recommendations Current Exercise Habits: The patient does not participate in regular exercise at present, Exercise limited by: orthopedic condition(s)  Diet (meal preparation, eat out, water intake, caffeinated beverages, dairy products, fruits and vegetables): in general, a "healthy" diet  , well balanced   Reviewed heart healthy diet, Encouraged patient to increase daily  water and fluid intake.    Goals    . Patient Stated     I want to lose weight by monitoring how much carbohydrates I eat, starting to go to the gym and exercise.        Fall Risk Fall Risk  04/14/2018 03/10/2017 12/06/2015  Falls in the past year? Yes No No  Number falls in past yr: 1 - -   Depression Screen PHQ 2/9 Scores 04/14/2018 03/10/2017 03/10/2017 12/06/2015  PHQ - 2 Score 0 0 0 0  PHQ- 9 Score 0 0 - -  Cognitive Function       Ad8 score reviewed for issues:  Issues making decisions: no  Less interest in hobbies / activities: no  Repeats questions, stories (family complaining): no  Trouble using ordinary gadgets (microwave, computer, phone):no  Forgets the month or year: no  Mismanaging finances: no  Remembering appts: no  Daily problems with thinking and/or memory: no Ad8 score is= 0  Immunization History  Administered Date(s) Administered  . Influenza Whole 10/16/2002, 09/05/2012  . Influenza, High Dose Seasonal PF 09/08/2017  . Influenza,inj,Quad PF,6+ Mos 09/04/2016  . Influenza-Unspecified 09/05/2013, 09/16/2015  . Meningococcal Polysaccharide 05/23/2010  . Pneumococcal Conjugate-13 12/06/2015  . Pneumococcal Polysaccharide-23 05/23/2010, 04/14/2018  . Tdap 12/06/2013   Screening Tests Health Maintenance  Topic Date Due  . PNA vac Low Risk Adult (2 of 2 - PPSV23) 12/05/2016  . INFLUENZA VACCINE  05/26/2018  . TETANUS/TDAP  12/07/2023  . COLONOSCOPY  08/26/2025  . Hepatitis C Screening  Completed        Plan:     Continue doing brain stimulating activities (puzzles, reading, adult coloring books, staying active) to keep memory sharp.   Continue to eat heart healthy diet (full of fruits, vegetables, whole grains, lean protein, water--limit salt, fat, and sugar intake) and increase physical activity as tolerated.  I have personally reviewed and noted the following in the patient's chart:   . Medical and social history . Use of alcohol,  tobacco or illicit drugs  . Current medications and supplements . Functional ability and status . Nutritional status . Physical activity . Advanced directives . List of other physicians . Vitals . Screenings to include cognitive, depression, and falls . Referrals and appointments  In addition, I have reviewed and discussed with patient certain preventive protocols, quality metrics, and best practice recommendations. A written personalized care plan for preventive services as well as general preventive health recommendations were provided to patient.     Michiel Cowboy, RN  04/14/2018  Medical screening examination/treatment/procedure(s) were performed by non-physician practitioner and as supervising physician I was immediately available for consultation/collaboration. I agree with above. Lew Dawes, MD

## 2018-04-14 NOTE — Assessment & Plan Note (Signed)
Accuretic po

## 2018-04-14 NOTE — Assessment & Plan Note (Signed)
F/u w/Dr Dahlstedt 

## 2018-04-14 NOTE — Progress Notes (Signed)
Subjective:  Patient ID: Jeffrey [name], male    DOB: 25-Aug-1951  Age: 67 y.o. MRN: [id number]  CC: No chief complaint on file.   HPI Jeffrey [name] presents for R shoulder pain, aorta atherosclerosis, BPH f/u  Outpatient Medications Prior to Visit  Medication Sig Dispense Refill  . Ascorbic Acid (VITAMIN C) 100 MG tablet Take 1 tablet by mouth daily.    Marland Kitchen aspirin 81 MG tablet Take 81 mg by mouth daily.      . Cholecalciferol (VITAMIN D3) 2000 units TABS Take 1 tablet by mouth.    . cyclobenzaprine (FLEXERIL) 5 MG tablet Take 1 tablet (5 mg total) 3 (three) times daily as needed by mouth for muscle spasms. 30 tablet 1  . finasteride (PROSCAR) 5 MG tablet Take 1 tablet (5 mg total) daily by mouth. Overdue for yearly physical w/labs must see MF for refills 90 tablet 3  . quinapril-hydrochlorothiazide (ACCURETIC) 20-12.5 MG tablet Take 2 tablets daily by mouth. 180 tablet 3  . sildenafil (REVATIO) 20 MG tablet Take 1-5 tablets (20-100 mg total) by mouth daily as needed. For ED 60 tablet 3  . tamsulosin (FLOMAX) 0.4 MG CAPS capsule Take 1 capsule (0.4 mg total) daily by mouth. Must keep schedule appt for future refills 90 capsule 3   No facility-administered medications prior to visit.     ROS: Review of Systems  Constitutional: Negative for appetite change, fatigue and unexpected weight change.  HENT: Negative for congestion, nosebleeds, sneezing, sore throat and trouble swallowing.   Eyes: Negative for itching and visual disturbance.  Respiratory: Negative for cough.   Cardiovascular: Negative for chest pain, palpitations and leg swelling.  Gastrointestinal: Negative for abdominal distention, blood in stool, diarrhea and nausea.  Genitourinary: Positive for frequency. Negative for hematuria.  Musculoskeletal: Positive for arthralgias. Negative for back pain, gait problem, joint swelling and neck pain.  Skin: Negative for rash.  Neurological: Negative for dizziness, tremors, speech  difficulty and weakness.  Psychiatric/Behavioral: Negative for agitation, dysphoric mood and sleep disturbance. The patient is not nervous/anxious.     Objective:  BP (!) 154/72   Pulse 62   Temp 98.8 F (37.1 C) (Oral)   Resp 18   Ht 6' (1.829 m)   Wt 252 lb (114.3 kg)   BMI 34.18 kg/m   BP Readings from Last 3 Encounters:  04/14/18 (!) 154/72  04/14/18 (!) 154/72  02/01/18 122/72    Wt Readings from Last 3 Encounters:  04/14/18 252 lb (114.3 kg)  04/14/18 252 lb (114.3 kg)  02/01/18 255 lb (115.7 kg)    Physical Exam  Constitutional: He is oriented to person, place, and time. He appears well-developed. No distress.  NAD  HENT:  Mouth/Throat: Oropharynx is clear and moist.  Eyes: Pupils are equal, round, and reactive to light. Conjunctivae are normal.  Neck: Normal range of motion. No JVD present. No thyromegaly present.  Cardiovascular: Normal rate, regular rhythm, normal heart sounds and intact distal pulses. Exam reveals no gallop and no friction rub.  No murmur heard. Pulmonary/Chest: Effort normal and breath sounds normal. No respiratory distress. He has no wheezes. He has no rales. He exhibits no tenderness.  Abdominal: Soft. Bowel sounds are normal. He exhibits no distension and no mass. There is no tenderness. There is no rebound and no guarding.  Musculoskeletal: Normal range of motion. He exhibits no edema or tenderness.  Lymphadenopathy:    He has no cervical adenopathy.  Neurological: He is alert and oriented  to person, place, and time. He has normal reflexes. No cranial nerve deficit. He exhibits normal muscle tone. He displays a negative Romberg sign. Coordination and gait normal.  Skin: Skin is warm and dry. No rash noted.  Psychiatric: He has a normal mood and affect. His behavior is normal. Judgment and thought content normal.  obese He had a rectal exam w/Dr Dahlstedt   Lab Results  Component Value Date   WBC 8.8 12/09/2015   HGB 15.3 12/09/2015    HCT 46.4 12/09/2015   PLT 306.0 12/09/2015   GLUCOSE 105 (H) 12/09/2015   CHOL 191 12/09/2015   TRIG 89.0 12/09/2015   HDL 49.20 12/09/2015   LDLDIRECT 151.8 05/15/2010   LDLCALC 124 (H) 12/09/2015   ALT 13 12/09/2015   AST 16 12/09/2015   NA 144 12/09/2015   K 4.5 12/09/2015   CL 102 12/09/2015   CREATININE 1.00 12/09/2015   BUN 19 12/09/2015   CO2 37 (H) 12/09/2015   TSH 2.08 12/09/2015   PSA 1.40 12/09/2015   INR 0.93 08/12/2010    Dg Shoulder Right  Result Date: 01/13/2018 CLINICAL DATA:  Fall 6 months ago.  Pain. EXAM: RIGHT SHOULDER - 2+ VIEW COMPARISON:  Chest x-ray 09/08/2017. FINDINGS: Acromioclavicular and glenohumeral degenerative change. No evidence of fracture or dislocation. IMPRESSION: Degenerative changes right shoulder.  Acute abnormality. Electronically Signed   By: Marcello Moores  Register   On: 01/13/2018 15:47   [place] Limited Joint Space Structures Up Right  Result Date: 01/21/2018 [facility] US performed of: Right This study was ordered, performed, and interpreted by [name] D.O.  Shoulder:  Supraspinatus: Patient is a full-thickness tear noted with 0.8 cm of retraction noted.  Hypoechoic changes noted.  The underlying arthritic changes Subscapularis: Near full-thickness tear noted as well. AC joint: Moderate arthritis Glenohumeral Joint: Degenerative changes posteriorly. Glenoid Labrum:  Intact without visualized tears. Biceps Tendon: Full rupture noted. Impression: Full-thickness rotator cuff tear with retraction  Procedure: Real-time Ultrasound Guided Injection of right glenohumeral joint Device: GE Logiq Q7 Ultrasound guided injection is preferred based studies that show increased duration, increased effect, greater accuracy, decreased procedural pain, increased response rate with ultrasound guided versus blind injection. Verbal informed consent obtained. Time-out conducted. Noted no overlying erythema, induration, or other signs of local infection. Skin prepped in a sterile  fashion. Local anesthesia: Topical Ethyl chloride. With sterile technique and under real time ultrasound guidance:  Joint visualized.  23g 1  inch needle inserted posterior approach. Pictures taken for needle placement. Patient did have injection of 2 cc of 1% lidocaine, 2 cc of 0.5% Marcaine, and 1.0 cc of Kenalog 40 mg/dL. Completed without difficulty Pain immediately resolved suggesting accurate placement of the medication. Advised to call if fevers/chills, erythema, induration, drainage, or persistent bleeding. Images permanently stored and available for review in the ultrasound unit. Impression: Technically successful ultrasound guided injection.     Assessment & Plan:   There are no diagnoses linked to this encounter.   No orders of the defined types were placed in this encounter.    Follow-up: No follow-ups on file.  Walker Kehr, MD

## 2018-07-21 ENCOUNTER — Telehealth: Payer: Self-pay | Admitting: Internal Medicine

## 2018-07-21 NOTE — Telephone Encounter (Signed)
Copied from [place] [id number]. 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

## 2018-07-22 NOTE — Telephone Encounter (Signed)
LM notifying pt that Dr. Alain Marion is out of the office till Monday. And informed pt to seek medical attention for sinus infection.  Please advise about antibiotic for pt not having a spleen?

## 2018-07-22 NOTE — Telephone Encounter (Signed)
Pt is checking status on the antibiotics being sent to the pharmacy. Also states he is beginning to get a sinus infection and wanted to clear it up before it gets bad.

## 2018-07-24 MED ORDER — AMOXICILLIN-POT CLAVULANATE 875-125 MG PO TABS: 1.0000 | ORAL_TABLET | Freq: Two times a day (BID) | ORAL | 0 refills | Status: DC

## 2018-07-24 NOTE — Telephone Encounter (Signed)
Augmentin Rx emailed Thx

## 2018-09-16 ENCOUNTER — Other Ambulatory Visit: Payer: Self-pay | Admitting: Internal Medicine

## 2018-09-16 NOTE — Telephone Encounter (Signed)
Copied from [place] [id number]. Topic: Quick Communication - Rx Refill/Question >> Sep 16, 2018  1:12 PM Mcneil, Ja-Kwan wrote: Medication:  tamsulosin (FLOMAX) 0.4 MG CAPS capsule   Has the patient contacted their pharmacy? yes   Preferred Pharmacy (with phone number or street name): [id number] - [place], [address] AT [address] [phone] (Phone) [phone] (Fax)  Agent: Please be advised that RX refills may take up to 3 business days. We ask that you follow-up with your pharmacy.

## 2018-09-16 NOTE — Telephone Encounter (Signed)
Call place to [place]. Spoke to pharmisict who states refills remain on pt tamsulosin RX. Pharmacy will reach out to patient.

## 2018-10-12 ENCOUNTER — Ambulatory Visit (INDEPENDENT_AMBULATORY_CARE_PROVIDER_SITE_OTHER): Payer: Medicare Other | Admitting: Internal Medicine

## 2018-10-12 ENCOUNTER — Encounter: Payer: Self-pay | Admitting: Internal Medicine

## 2018-10-12 VITALS — BP 154/70 | HR 59 | Temp 98.5°F | Ht 72.0 in | Wt 254.0 lb

## 2018-10-12 DIAGNOSIS — I700 Atherosclerosis of aorta: Secondary | ICD-10-CM

## 2018-10-12 DIAGNOSIS — Z23 Encounter for immunization: Secondary | ICD-10-CM | POA: Diagnosis not present

## 2018-10-12 DIAGNOSIS — K219 Gastro-esophageal reflux disease without esophagitis: Secondary | ICD-10-CM

## 2018-10-12 DIAGNOSIS — I10 Essential (primary) hypertension: Secondary | ICD-10-CM | POA: Diagnosis not present

## 2018-10-12 MED ORDER — TELMISARTAN 80 MG PO TABS: 80.0000 mg | ORAL_TABLET | Freq: Every day | ORAL | 3 refills | Status: DC

## 2018-10-12 MED ORDER — TRIAMTERENE-HCTZ 37.5-25 MG PO TABS: 1.0000 | ORAL_TABLET | Freq: Every day | ORAL | 3 refills | Status: DC

## 2018-10-12 NOTE — Assessment & Plan Note (Signed)
Omeprazole prn.

## 2018-10-12 NOTE — Assessment & Plan Note (Signed)
Uncontrolled D/c Accuretic StartMicardis and Maxzide

## 2018-10-12 NOTE — Addendum Note (Signed)
Addended by: Karren Cobble on: 10/12/2018 02:13 PM   Modules accepted: Orders

## 2018-10-12 NOTE — Patient Instructions (Signed)

## 2018-10-12 NOTE — Progress Notes (Signed)
Subjective:  Patient ID: Jeffrey [name], male    DOB: 05-27-1951  Age: 67 y.o. MRN: [id number]  CC: No chief complaint on file.   HPI Jeffrey [name] presents for OA, GERD, HTN f/u. SBP >150 on bid Accuretic  Outpatient Medications Prior to Visit  Medication Sig Dispense Refill  . amoxicillin-clavulanate (AUGMENTIN) 875-125 MG tablet Take 1 tablet by mouth 2 (two) times daily. 20 tablet 0  . Ascorbic Acid (VITAMIN C) 100 MG tablet Take 1 tablet by mouth daily.    Marland Kitchen aspirin 81 MG tablet Take 81 mg by mouth daily.      . Cholecalciferol (VITAMIN D3) 2000 units TABS Take 1 tablet by mouth.    . cyclobenzaprine (FLEXERIL) 5 MG tablet Take 1 tablet (5 mg total) 3 (three) times daily as needed by mouth for muscle spasms. 30 tablet 1  . finasteride (PROSCAR) 5 MG tablet Take 1 tablet (5 mg total) by mouth daily. Overdue for yearly physical w/labs must see MF for refills 90 tablet 3  . Icosapent Ethyl (VASCEPA) 1 g CAPS Take 2 capsules (2 g total) by mouth 2 (two) times daily. 120 capsule 11  . omeprazole (PRILOSEC) 20 MG capsule Take 20 mg by mouth daily.    . quinapril-hydrochlorothiazide (ACCURETIC) 20-12.5 MG tablet Take 2 tablets by mouth daily. 180 tablet 3  . sildenafil (REVATIO) 20 MG tablet Take 1-5 tablets (20-100 mg total) by mouth daily as needed. For ED 60 tablet 3  . tamsulosin (FLOMAX) 0.4 MG CAPS capsule Take 1 capsule (0.4 mg total) by mouth daily. Must keep schedule appt for future refills 90 capsule 3   No facility-administered medications prior to visit.     ROS: Review of Systems  Constitutional: Positive for unexpected weight change. Negative for appetite change and fatigue.  HENT: Negative for congestion, nosebleeds, sneezing, sore throat and trouble swallowing.   Eyes: Negative for itching and visual disturbance.  Respiratory: Negative for cough.   Cardiovascular: Negative for chest pain, palpitations and leg swelling.  Gastrointestinal: Negative for abdominal  distention, blood in stool, diarrhea and nausea.  Genitourinary: Negative for frequency and hematuria.  Musculoskeletal: Negative for back pain, gait problem, joint swelling and neck pain.  Skin: Negative for rash.  Neurological: Negative for dizziness, tremors, speech difficulty and weakness.  Psychiatric/Behavioral: Negative for agitation, dysphoric mood and sleep disturbance. The patient is not nervous/anxious.     Objective:  BP (!) 154/70 (BP Location: Left Arm, Patient Position: Sitting, Cuff Size: Large)   Pulse (!) 59   Temp 98.5 F (36.9 C) (Oral)   Ht 6' (1.829 m)   Wt 254 lb (115.2 kg)   SpO2 91%   BMI 34.45 kg/m   BP Readings from Last 3 Encounters:  10/12/18 (!) 154/70  04/14/18 (!) 154/72  04/14/18 (!) 154/72    Wt Readings from Last 3 Encounters:  10/12/18 254 lb (115.2 kg)  04/14/18 252 lb (114.3 kg)  04/14/18 252 lb (114.3 kg)    Physical Exam Constitutional:      General: He is not in acute distress.    Appearance: He is well-developed.     Comments: NAD  Eyes:     Conjunctiva/sclera: Conjunctivae normal.     Pupils: Pupils are equal, round, and reactive to light.  Neck:     Musculoskeletal: Normal range of motion.     Thyroid: No thyromegaly.     Vascular: No JVD.  Cardiovascular:     Rate and Rhythm: Normal  rate and regular rhythm.     Heart sounds: Normal heart sounds. No murmur. No friction rub. No gallop.   Pulmonary:     Effort: Pulmonary effort is normal. No respiratory distress.     Breath sounds: Normal breath sounds. No wheezing or rales.  Chest:     Chest wall: No tenderness.  Abdominal:     General: Bowel sounds are normal. There is no distension.     Palpations: Abdomen is soft. There is no mass.     Tenderness: There is no abdominal tenderness. There is no guarding or rebound.  Musculoskeletal: Normal range of motion.        General: No tenderness.  Lymphadenopathy:     Cervical: No cervical adenopathy.  Skin:    General:  Skin is warm and dry.     Findings: No rash.  Neurological:     Mental Status: He is alert and oriented to person, place, and time.     Cranial Nerves: No cranial nerve deficit.     Motor: No abnormal muscle tone.     Coordination: Coordination normal.     Gait: Gait normal.     Deep Tendon Reflexes: Reflexes are normal and symmetric.  Psychiatric:        Behavior: Behavior normal.        Thought Content: Thought content normal.        Judgment: Judgment normal.   trace B edema  Lab Results  Component Value Date   WBC 9.3 04/14/2018   HGB 15.1 04/14/2018   HCT 44.9 04/14/2018   PLT 305.0 04/14/2018   GLUCOSE 92 04/14/2018   CHOL 183 04/14/2018   TRIG 83.0 04/14/2018   HDL 55.50 04/14/2018   LDLDIRECT 151.8 05/15/2010   LDLCALC 111 (H) 04/14/2018   ALT 11 04/14/2018   AST 14 04/14/2018   NA 141 04/14/2018   K 3.9 04/14/2018   CL 99 04/14/2018   CREATININE 0.90 04/14/2018   BUN 11 04/14/2018   CO2 35 (H) 04/14/2018   TSH 1.84 04/14/2018   PSA 1.40 12/09/2015   INR 0.93 08/12/2010    Dg Shoulder Right  Result Date: 01/13/2018 CLINICAL DATA:  Fall 6 months ago.  Pain. EXAM: RIGHT SHOULDER - 2+ VIEW COMPARISON:  Chest x-ray 09/08/2017. FINDINGS: Acromioclavicular and glenohumeral degenerative change. No evidence of fracture or dislocation. IMPRESSION: Degenerative changes right shoulder.  Acute abnormality. Electronically Signed   By: Marcello Moores  Register   On: 01/13/2018 15:47   [place] Limited Joint Space Structures Up Right  Result Date: 01/21/2018 [facility] US performed of: Right This study was ordered, performed, and interpreted by [name] D.O.  Shoulder:  Supraspinatus: Patient is a full-thickness tear noted with 0.8 cm of retraction noted.  Hypoechoic changes noted.  The underlying arthritic changes Subscapularis: Near full-thickness tear noted as well. AC joint: Moderate arthritis Glenohumeral Joint: Degenerative changes posteriorly. Glenoid Labrum:  Intact without visualized  tears. Biceps Tendon: Full rupture noted. Impression: Full-thickness rotator cuff tear with retraction  Procedure: Real-time Ultrasound Guided Injection of right glenohumeral joint Device: GE Logiq Q7 Ultrasound guided injection is preferred based studies that show increased duration, increased effect, greater accuracy, decreased procedural pain, increased response rate with ultrasound guided versus blind injection. Verbal informed consent obtained. Time-out conducted. Noted no overlying erythema, induration, or other signs of local infection. Skin prepped in a sterile fashion. Local anesthesia: Topical Ethyl chloride. With sterile technique and under real time ultrasound guidance:  Joint visualized.  23g 1  inch needle inserted posterior approach. Pictures taken for needle placement. Patient did have injection of 2 cc of 1% lidocaine, 2 cc of 0.5% Marcaine, and 1.0 cc of Kenalog 40 mg/dL. Completed without difficulty Pain immediately resolved suggesting accurate placement of the medication. Advised to call if fevers/chills, erythema, induration, drainage, or persistent bleeding. Images permanently stored and available for review in the ultrasound unit. Impression: Technically successful ultrasound guided injection.     Assessment & Plan:   There are no diagnoses linked to this encounter.   No orders of the defined types were placed in this encounter.    Follow-up: No follow-ups on file.  Walker Kehr, MD

## 2018-10-12 NOTE — Assessment & Plan Note (Signed)
°

## 2019-02-16 ENCOUNTER — Telehealth: Payer: Self-pay | Admitting: Internal Medicine

## 2019-02-16 NOTE — Telephone Encounter (Signed)
LVM for patient to call back and make a virtual appointment.

## 2019-02-16 NOTE — Telephone Encounter (Signed)
Patient called and stated that he needs a muscle relaxer sent in because he pulled something in his back. 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 Please advise

## 2019-05-25 ENCOUNTER — Other Ambulatory Visit: Payer: Self-pay

## 2019-06-08 ENCOUNTER — Other Ambulatory Visit: Payer: Self-pay | Admitting: Internal Medicine

## 2019-07-05 ENCOUNTER — Other Ambulatory Visit: Payer: Self-pay | Admitting: Internal Medicine

## 2019-07-20 ENCOUNTER — Other Ambulatory Visit: Payer: Self-pay

## 2019-07-20 ENCOUNTER — Other Ambulatory Visit (INDEPENDENT_AMBULATORY_CARE_PROVIDER_SITE_OTHER): Payer: Medicare Other

## 2019-07-20 ENCOUNTER — Encounter: Payer: Self-pay | Admitting: Internal Medicine

## 2019-07-20 ENCOUNTER — Ambulatory Visit (INDEPENDENT_AMBULATORY_CARE_PROVIDER_SITE_OTHER): Payer: Medicare Other | Admitting: Internal Medicine

## 2019-07-20 VITALS — BP 152/78 | HR 56 | Temp 97.9°F | Ht 72.0 in | Wt 252.0 lb

## 2019-07-20 DIAGNOSIS — E785 Hyperlipidemia, unspecified: Secondary | ICD-10-CM

## 2019-07-20 DIAGNOSIS — I10 Essential (primary) hypertension: Secondary | ICD-10-CM

## 2019-07-20 DIAGNOSIS — N400 Enlarged prostate without lower urinary tract symptoms: Secondary | ICD-10-CM

## 2019-07-20 DIAGNOSIS — R972 Elevated prostate specific antigen [PSA]: Secondary | ICD-10-CM | POA: Diagnosis not present

## 2019-07-20 DIAGNOSIS — Z23 Encounter for immunization: Secondary | ICD-10-CM | POA: Diagnosis not present

## 2019-07-20 LAB — LIPID PANEL
Cholesterol: 173 mg/dL (ref 0–200)
HDL: 46.8 mg/dL (ref >39.00)
LDL Cholesterol: 114 mg/dL — ABNORMAL HIGH (ref 0–99)
NonHDL: 125.73
Total CHOL/HDL Ratio: 4
Triglycerides: 60 mg/dL (ref 0.0–149.0)
VLDL: 12 mg/dL (ref 0.0–40.0)

## 2019-07-20 LAB — BASIC METABOLIC PANEL
BUN: 15 mg/dL (ref 6–23)
CO2: 34 mEq/L — ABNORMAL HIGH (ref 19–32)
Calcium: 9.8 mg/dL (ref 8.4–10.5)
Chloride: 101 mEq/L (ref 96–112)
Creatinine, Ser: 1 mg/dL (ref 0.40–1.50)
GFR: 73.98 mL/min (ref >60.00)
Glucose, Bld: 86 mg/dL (ref 70–99)
Potassium: 4.6 mEq/L (ref 3.5–5.1)
Sodium: 141 mEq/L (ref 135–145)

## 2019-07-20 LAB — PSA: PSA: 4.46 ng/mL — ABNORMAL HIGH (ref 0.10–4.00)

## 2019-07-20 LAB — CBC WITH DIFFERENTIAL/PLATELET
Basophils Absolute: 0.1 10*3/uL (ref 0.0–0.1)
Basophils Relative: 1.1 % (ref 0.0–3.0)
Eosinophils Absolute: 0.1 10*3/uL (ref 0.0–0.7)
Eosinophils Relative: 1.6 % (ref 0.0–5.0)
HCT: 45 % (ref 39.0–52.0)
Hemoglobin: 15 g/dL (ref 13.0–17.0)
Lymphocytes Relative: 32.4 % (ref 12.0–46.0)
Lymphs Abs: 2.7 10*3/uL (ref 0.7–4.0)
MCHC: 33.2 g/dL (ref 30.0–36.0)
MCV: 98.2 fl (ref 78.0–100.0)
Monocytes Absolute: 1.1 10*3/uL — ABNORMAL HIGH (ref 0.1–1.0)
Monocytes Relative: 13.3 % — ABNORMAL HIGH (ref 3.0–12.0)
Neutro Abs: 4.4 10*3/uL (ref 1.4–7.7)
Neutrophils Relative %: 51.6 % (ref 43.0–77.0)
Platelets: 299 10*3/uL (ref 150.0–400.0)
RBC: 4.59 Mil/uL (ref 4.22–5.81)
RDW: 13.7 % (ref 11.5–15.5)
WBC: 8.4 10*3/uL (ref 4.0–10.5)

## 2019-07-20 LAB — URINALYSIS
Bilirubin Urine: NEGATIVE
Hgb urine dipstick: NEGATIVE
Ketones, ur: NEGATIVE
Leukocytes,Ua: NEGATIVE
Nitrite: NEGATIVE
Specific Gravity, Urine: 1.02 (ref 1.000–1.030)
Total Protein, Urine: NEGATIVE
Urine Glucose: NEGATIVE
Urobilinogen, UA: 0.2 (ref 0.0–1.0)
pH: 6.5 (ref 5.0–8.0)

## 2019-07-20 LAB — HEPATIC FUNCTION PANEL
ALT: 11 U/L (ref 0–53)
AST: 16 U/L (ref 0–37)
Albumin: 3.9 g/dL (ref 3.5–5.2)
Alkaline Phosphatase: 73 U/L (ref 39–117)
Bilirubin, Direct: 0.1 mg/dL (ref 0.0–0.3)
Total Bilirubin: 0.5 mg/dL (ref 0.2–1.2)
Total Protein: 6.9 g/dL (ref 6.0–8.3)

## 2019-07-20 LAB — TSH: TSH: 1.92 u[IU]/mL (ref 0.35–4.50)

## 2019-07-20 MED ORDER — TRIAMTERENE-HCTZ 37.5-25 MG PO TABS: 1.0000 | ORAL_TABLET | Freq: Every day | ORAL | 3 refills | Status: DC

## 2019-07-20 MED ORDER — TELMISARTAN 80 MG PO TABS: 80.0000 mg | ORAL_TABLET | Freq: Every day | ORAL | 3 refills | Status: DC

## 2019-07-20 MED ORDER — SHINGRIX 50 MCG/0.5ML IM SUSR: 0.5000 mL | Freq: Once | INTRAMUSCULAR | 1 refills | Status: AC

## 2019-07-20 MED ORDER — TAMSULOSIN HCL 0.4 MG PO CAPS: 0.4000 mg | ORAL_CAPSULE | Freq: Every day | ORAL | 3 refills | Status: DC

## 2019-07-20 NOTE — Progress Notes (Signed)
Subjective:  Patient ID: Jeffrey [name], male    DOB: 1951-09-25  Age: 68 y.o. MRN: [id number]  CC: No chief complaint on file.   HPI Jeffrey [name] presents for BPH, GERD, HTN f/u C/o wt gain  Outpatient Medications Prior to Visit  Medication Sig Dispense Refill  . amoxicillin-clavulanate (AUGMENTIN) 875-125 MG tablet Take 1 tablet by mouth 2 (two) times daily. 20 tablet 0  . Ascorbic Acid (VITAMIN C) 100 MG tablet Take 1 tablet by mouth daily.    Marland Kitchen aspirin 81 MG tablet Take 81 mg by mouth daily.      . Cholecalciferol (VITAMIN D3) 2000 units TABS Take 1 tablet by mouth.    . cyclobenzaprine (FLEXERIL) 5 MG tablet Take 1 tablet (5 mg total) 3 (three) times daily as needed by mouth for muscle spasms. 30 tablet 1  . finasteride (PROSCAR) 5 MG tablet Take 1 tablet (5 mg total) by mouth daily. Overdue for yearly physical w/labs must see MF for refills 90 tablet 3  . Icosapent Ethyl (VASCEPA) 1 g CAPS Take 2 capsules (2 g total) by mouth 2 (two) times daily. 120 capsule 11  . omeprazole (PRILOSEC) 20 MG capsule Take 20 mg by mouth daily.    . sildenafil (REVATIO) 20 MG tablet Take 1-5 tablets (20-100 mg total) by mouth daily as needed. For ED 60 tablet 3  . tamsulosin (FLOMAX) 0.4 MG CAPS capsule Take 1 capsule (0.4 mg total) by mouth daily. Must keep upcoming appt to get refills 30 capsule 0  . telmisartan (MICARDIS) 80 MG tablet Take 1 tablet (80 mg total) by mouth daily. 90 tablet 3  . triamterene-hydrochlorothiazide (MAXZIDE-25) 37.5-25 MG tablet Take 1 tablet by mouth daily. 90 tablet 3   No facility-administered medications prior to visit.     ROS: Review of Systems  Constitutional: Negative for appetite change, fatigue and unexpected weight change.  HENT: Negative for congestion, nosebleeds, sneezing, sore throat and trouble swallowing.   Eyes: Negative for itching and visual disturbance.  Respiratory: Negative for cough.   Cardiovascular: Negative for chest pain, palpitations  and leg swelling.  Gastrointestinal: Negative for abdominal distention, blood in stool, diarrhea and nausea.  Genitourinary: Positive for urgency. Negative for frequency and hematuria.  Musculoskeletal: Negative for back pain, gait problem, joint swelling and neck pain.  Skin: Negative for rash.  Neurological: Negative for dizziness, tremors, speech difficulty and weakness.  Psychiatric/Behavioral: Negative for agitation, dysphoric mood, sleep disturbance and suicidal ideas. The patient is not nervous/anxious.     Objective:  BP (!) 152/78 (BP Location: Left Arm, Patient Position: Sitting, Cuff Size: Normal)   Pulse (!) 56   Temp 97.9 F (36.6 C) (Oral)   Ht 6' (1.829 m)   Wt 252 lb (114.3 kg)   SpO2 97%   BMI 34.18 kg/m   BP Readings from Last 3 Encounters:  07/20/19 (!) 152/78  10/12/18 (!) 154/70  04/14/18 (!) 154/72    Wt Readings from Last 3 Encounters:  07/20/19 252 lb (114.3 kg)  10/12/18 254 lb (115.2 kg)  04/14/18 252 lb (114.3 kg)    Physical Exam Constitutional:      General: He is not in acute distress.    Appearance: He is well-developed.     Comments: NAD  Eyes:     Conjunctiva/sclera: Conjunctivae normal.     Pupils: Pupils are equal, round, and reactive to light.  Neck:     Musculoskeletal: Normal range of motion.  Thyroid: No thyromegaly.     Vascular: No JVD.  Cardiovascular:     Rate and Rhythm: Normal rate and regular rhythm.     Heart sounds: Normal heart sounds. No murmur. No friction rub. No gallop.   Pulmonary:     Effort: Pulmonary effort is normal. No respiratory distress.     Breath sounds: Normal breath sounds. No wheezing or rales.  Chest:     Chest wall: No tenderness.  Abdominal:     General: Bowel sounds are normal. There is no distension.     Palpations: Abdomen is soft. There is no mass.     Tenderness: There is no abdominal tenderness. There is no guarding or rebound.  Musculoskeletal: Normal range of motion.         General: No tenderness.  Lymphadenopathy:     Cervical: No cervical adenopathy.  Skin:    General: Skin is warm and dry.     Findings: No rash.  Neurological:     Mental Status: He is alert and oriented to person, place, and time.     Cranial Nerves: No cranial nerve deficit.     Motor: No abnormal muscle tone.     Coordination: Coordination normal.     Gait: Gait normal.     Deep Tendon Reflexes: Reflexes are normal and symmetric.  Psychiatric:        Behavior: Behavior normal.        Thought Content: Thought content normal.        Judgment: Judgment normal.   rectal - per Urology obese  Lab Results  Component Value Date   WBC 9.3 04/14/2018   HGB 15.1 04/14/2018   HCT 44.9 04/14/2018   PLT 305.0 04/14/2018   GLUCOSE 92 04/14/2018   CHOL 183 04/14/2018   TRIG 83.0 04/14/2018   HDL 55.50 04/14/2018   LDLDIRECT 151.8 05/15/2010   LDLCALC 111 (H) 04/14/2018   ALT 11 04/14/2018   AST 14 04/14/2018   NA 141 04/14/2018   K 3.9 04/14/2018   CL 99 04/14/2018   CREATININE 0.90 04/14/2018   BUN 11 04/14/2018   CO2 35 (H) 04/14/2018   TSH 1.84 04/14/2018   PSA 1.40 12/09/2015   INR 0.93 08/12/2010    Dg Shoulder Right  Result Date: 01/13/2018 CLINICAL DATA:  Fall 6 months ago.  Pain. EXAM: RIGHT SHOULDER - 2+ VIEW COMPARISON:  Chest x-ray 09/08/2017. FINDINGS: Acromioclavicular and glenohumeral degenerative change. No evidence of fracture or dislocation. IMPRESSION: Degenerative changes right shoulder.  Acute abnormality. Electronically Signed   By: Marcello Moores  Register   On: 01/13/2018 15:47   [place] Limited Joint Space Structures Up Right  Result Date: 01/21/2018 [facility] US performed of: Right This study was ordered, performed, and interpreted by [name] D.O.  Shoulder:  Supraspinatus: Patient is a full-thickness tear noted with 0.8 cm of retraction noted.  Hypoechoic changes noted.  The underlying arthritic changes Subscapularis: Near full-thickness tear noted as well. AC joint:  Moderate arthritis Glenohumeral Joint: Degenerative changes posteriorly. Glenoid Labrum:  Intact without visualized tears. Biceps Tendon: Full rupture noted. Impression: Full-thickness rotator cuff tear with retraction  Procedure: Real-time Ultrasound Guided Injection of right glenohumeral joint Device: GE Logiq Q7 Ultrasound guided injection is preferred based studies that show increased duration, increased effect, greater accuracy, decreased procedural pain, increased response rate with ultrasound guided versus blind injection. Verbal informed consent obtained. Time-out conducted. Noted no overlying erythema, induration, or other signs of local infection. Skin prepped in a sterile  fashion. Local anesthesia: Topical Ethyl chloride. With sterile technique and under real time ultrasound guidance:  Joint visualized.  23g 1  inch needle inserted posterior approach. Pictures taken for needle placement. Patient did have injection of 2 cc of 1% lidocaine, 2 cc of 0.5% Marcaine, and 1.0 cc of Kenalog 40 mg/dL. Completed without difficulty Pain immediately resolved suggesting accurate placement of the medication. Advised to call if fevers/chills, erythema, induration, drainage, or persistent bleeding. Images permanently stored and available for review in the ultrasound unit. Impression: Technically successful ultrasound guided injection.     Assessment & Plan:   There are no diagnoses linked to this encounter.   No orders of the defined types were placed in this encounter.    Follow-up: No follow-ups on file.  Walker Kehr, MD

## 2019-07-20 NOTE — Assessment & Plan Note (Signed)
Info re cardiac CT scan for calcium scoring given  Diet

## 2019-07-20 NOTE — Addendum Note (Signed)
Addended by: Karren Cobble on: 07/20/2019 03:28 PM   Modules accepted: Orders

## 2019-07-20 NOTE — Assessment & Plan Note (Signed)
Micardis and Maxzide

## 2019-07-20 NOTE — Assessment & Plan Note (Signed)
PSA

## 2019-07-20 NOTE — Patient Instructions (Signed)
If you have medicare related insurance (such as traditional Medicare, Blue Cross Medicare, [employer] Medicare, or similar), Please make an appointment at the scheduling desk with Jill, the Wellness Health Coach, for your Wellness visit in this office, which is a benefit with your insurance.     These suggestions will probably help you to improve your metabolism if you are not overweight and to lose weight if you are overweight: 1.  Reduce your consumption of sugars and starches.  Eliminate high fructose corn syrup from your diet.  Reduce your consumption of processed foods.  For desserts try to have seasonal fruits, berries, nuts, cheeses or dark chocolate with more than 70% cacao. 2.  Do not snack 3.  You do not have to eat breakfast.  If you choose to have breakfast-eat plain greek yogurt, eggs, oatmeal (without sugar) 4.  Drink water, freshly brewed unsweetened tea (green, black or herbal) or coffee.  Do not drink sodas including diet sodas , juices, beverages sweetened with artificial sweeteners. 5.  Reduce your consumption of refined grains. 6.  Avoid protein drinks such as Optifast, Slim fast etc. Eat chicken, fish, meat, dairy and beans for your sources of protein 7.  Natural unprocessed fats like cold pressed virgin olive oil, butter, coconut oil are good for you.  Eat avocados 8.  Increase your consumption of fiber.  Fruits, berries, vegetables, whole grains, flaxseeds, Chia seeds, beans, popcorn, nuts, oatmeal are good sources of fiber 9.  Use vinegar in your diet, i.e. apple cider vinegar, red wine or balsamic vinegar 10.  You can try fasting.  For example you can skip breakfast and lunch every other day (24-hour fast) 11.  Stress reduction, good night sleep, relaxation, meditation, yoga and other physical activity is likely to help you to maintain low weight too. 12.  If you drink alcohol, limit your alcohol intake to no more than 2 drinks a day.   Mediterranean diet is good  for you. ([employer] has a typical Mediterranean cuisine menu) The Mediterranean diet is a way of eating based on the traditional cuisine of countries bordering the [place] Sea. While there is no single definition of the Mediterranean diet, it is typically high in vegetables, fruits, whole grains, beans, nut and seeds, and olive oil. The main components of Mediterranean diet include: . Daily consumption of vegetables, fruits, whole grains and healthy fats  . Weekly intake of fish, poultry, beans and eggs  . Moderate portions of dairy products  . Limited intake of red meat Other important elements of the Mediterranean diet are sharing meals with family and friends, enjoying a glass of red wine and being physically active. Health benefits of a Mediterranean diet: A traditional Mediterranean diet consisting of large quantities of fresh fruits and vegetables, nuts, fish and olive oil-coupled with physical activity-can reduce your risk of serious mental and physical health problems by: Preventing heart disease and strokes. Following a Mediterranean diet limits your intake of refined breads, processed foods, and red meat, and encourages drinking red wine instead of hard liquor-all factors that can help prevent heart disease and stroke. Keeping you agile. If you're an older adult, the nutrients gained with a Mediterranean diet may reduce your risk of developing muscle weakness and other signs of frailty by about 70 percent. Reducing the risk of Alzheimer's. Research suggests that the Mediterranean diet may improve cholesterol, blood sugar levels, and overall blood vessel health, which in turn may reduce your risk of Alzheimer's disease or dementia. Halving   the risk of Parkinson's disease. The high levels of antioxidants in the Mediterranean diet can prevent cells from undergoing a damaging process called oxidative stress, thereby cutting the risk of Parkinson's disease in half. Increasing longevity.  By reducing your risk of developing heart disease or cancer with the Mediterranean diet, you're reducing your risk of death at any age by 20%. Protecting against type 2 diabetes. A Mediterranean diet is rich in fiber which digests slowly, prevents huge swings in blood sugar, and can help you maintain a healthy weight.    Cabbage soup recipe that will not make you gain weight: Take 1 small head of cabbage, 1 average pack of celery, 4 green peppers, 4 onions, 2 cans diced tomatoes (they are not available without salt), salt and spices to taste.  Chop cabbage, celery, peppers and onions.  And tomatoes and 2-2.5 liters (2.5 quarts) of water so that it would just cover the vegetables.  Bring to boil.  Add spices and salt.  Turn heat to low/medium and simmer for 20-25 minutes.  Naturally, you can make a smaller batch and change some of the ingredients.    Cardiac CT calcium scoring test $150   Computed tomography, more commonly known as a CT or CAT scan, is a diagnostic medical imaging test. Like traditional x-rays, it produces multiple images or pictures of the inside of the body. The cross-sectional images generated during a CT scan can be reformatted in multiple planes. They can even generate three-dimensional images. These images can be viewed on a computer monitor, printed on film or by a 3D printer, or transferred to a CD or DVD. CT images of internal organs, bones, soft tissue and blood vessels provide greater detail than traditional x-rays, particularly of soft tissues and blood vessels. A cardiac CT scan for coronary calcium is a non-invasive way of obtaining information about the presence, location and extent of calcified plaque in the coronary arteries-the vessels that supply oxygen-containing blood to the heart muscle. Calcified plaque results when there is a build-up of fat and other substances under the inner layer of the artery. This material can calcify which signals the presence of  atherosclerosis, a disease of the vessel wall, also called coronary artery disease (CAD). People with this disease have an increased risk for heart attacks. In addition, over time, progression of plaque build up (CAD) can narrow the arteries or even close off blood flow to the heart. The result may be chest pain, sometimes called "angina," or a heart attack. Because calcium is a marker of CAD, the amount of calcium detected on a cardiac CT scan is a helpful prognostic tool. The findings on cardiac CT are expressed as a calcium score. Another name for this test is coronary artery calcium scoring.  What are some common uses of the procedure? The goal of cardiac CT scan for calcium scoring is to determine if CAD is present and to what extent, even if there are no symptoms. It is a screening study that may be recommended by a physician for patients with risk factors for CAD but no clinical symptoms. The major risk factors for CAD are: . high blood cholesterol levels  . family history of heart attacks  . diabetes  . high blood pressure  . cigarette smoking  . overweight or obese  . physical inactivity   A negative cardiac CT scan for calcium scoring shows no calcification within the coronary arteries. This suggests that CAD is absent or so minimal it cannot be   seen by this technique. The chance of having a heart attack over the next two to five years is very low under these circumstances. A positive test means that CAD is present, regardless of whether or not the patient is experiencing any symptoms. The amount of calcification-expressed as the calcium score-may help to predict the likelihood of a myocardial infarction (heart attack) in the coming years and helps your medical doctor or cardiologist decide whether the patient may need to take preventive medicine or undertake other measures such as diet and exercise to lower the risk for heart attack. The extent of CAD is graded according to your calcium  score:  Calcium Score  Presence of CAD (coronary artery disease)  0 No evidence of CAD   1-10 Minimal evidence of CAD  11-100 Mild evidence of CAD  101-400 Moderate evidence of CAD  Over 400 Extensive evidence of CAD    

## 2019-08-30 DIAGNOSIS — N401 Enlarged prostate with lower urinary tract symptoms: Secondary | ICD-10-CM | POA: Diagnosis not present

## 2019-08-30 DIAGNOSIS — R972 Elevated prostate specific antigen [PSA]: Secondary | ICD-10-CM | POA: Diagnosis not present

## 2019-08-30 DIAGNOSIS — R3911 Hesitancy of micturition: Secondary | ICD-10-CM | POA: Diagnosis not present

## 2019-08-30 DIAGNOSIS — N5201 Erectile dysfunction due to arterial insufficiency: Secondary | ICD-10-CM | POA: Diagnosis not present

## 2019-09-20 ENCOUNTER — Other Ambulatory Visit: Payer: Self-pay

## 2019-11-12 ENCOUNTER — Other Ambulatory Visit: Payer: Self-pay | Admitting: Internal Medicine

## 2019-12-12 ENCOUNTER — Telehealth: Payer: Self-pay

## 2019-12-12 NOTE — Telephone Encounter (Signed)
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 States that he has heard different things about whether this is safe or not. Please advise.

## 2019-12-13 NOTE — Telephone Encounter (Signed)
Please advise 

## 2019-12-14 NOTE — Telephone Encounter (Signed)
Yes, it is.  Thanks ?

## 2019-12-14 NOTE — Telephone Encounter (Signed)
LM notifying pt

## 2020-05-01 ENCOUNTER — Encounter: Payer: Self-pay | Admitting: Internal Medicine

## 2020-05-01 ENCOUNTER — Ambulatory Visit (INDEPENDENT_AMBULATORY_CARE_PROVIDER_SITE_OTHER): Payer: Medicare Other | Admitting: Internal Medicine

## 2020-05-01 ENCOUNTER — Other Ambulatory Visit: Payer: Self-pay

## 2020-05-01 DIAGNOSIS — M65342 Trigger finger, left ring finger: Secondary | ICD-10-CM | POA: Diagnosis not present

## 2020-05-01 DIAGNOSIS — I10 Essential (primary) hypertension: Secondary | ICD-10-CM | POA: Diagnosis not present

## 2020-05-01 DIAGNOSIS — D485 Neoplasm of uncertain behavior of skin: Secondary | ICD-10-CM | POA: Diagnosis not present

## 2020-05-01 DIAGNOSIS — M6530 Trigger finger, unspecified finger: Secondary | ICD-10-CM | POA: Insufficient documentation

## 2020-05-01 MED ORDER — CELECOXIB 100 MG PO CAPS: 100.0000 mg | ORAL_CAPSULE | Freq: Every day | ORAL | 1 refills | Status: DC

## 2020-05-01 MED ORDER — METHYLPREDNISOLONE ACETATE 40 MG/ML IJ SUSP
40.0000 mg | Freq: Once | INTRAMUSCULAR | Status: AC
  Administered 2020-05-01: 10 mg via INTRAMUSCULAR

## 2020-05-01 NOTE — Assessment & Plan Note (Signed)
BP Readings from Last 3 Encounters:  05/01/20 126/82  07/20/19 (!) 152/78  10/12/18 (!) 154/70

## 2020-05-01 NOTE — Patient Instructions (Signed)
Trigger Finger  Trigger finger, also called stenosing tenosynovitis,  is a condition that causes a finger to get stuck in a bent position. Each finger has a tendon, which is a tough, cord-like tissue that connects muscle to bone, and each tendon passes through a tunnel of tissue called a tendon sheath. To move your finger, your tendon needs to glide freely through the sheath. Trigger finger happens when the tendon or the sheath thickens, making it difficult to move your finger. Trigger finger can affect any finger or a thumb. It may affect more than one finger. Mild cases may clear up with rest and medicine. Severe cases require more treatment. What are the causes? Trigger finger is caused by a thickened finger tendon or tendon sheath. The cause of this thickening is not known. What increases the risk? The following factors may make you more likely to develop this condition:  Doing activities that require a strong grip.  Having rheumatoid arthritis, gout, or diabetes.  Being 40-60 years old.  Being male. What are the signs or symptoms? Symptoms of this condition include:  Pain when bending or straightening your finger.  Tenderness or swelling where your finger attaches to the palm of your hand.  A lump in the palm of your hand or on the inside of your finger.  Hearing a noise like a pop or a snap when you try to straighten your finger.  Feeling a catching or locking sensation when you try to straighten your finger.  Being unable to straighten your finger. How is this diagnosed? This condition is diagnosed based on your symptoms and a physical exam. How is this treated? This condition may be treated by:  Resting your finger and avoiding activities that make symptoms worse.  Wearing a finger splint to keep your finger extended.  Taking NSAIDs, such as ibuprofen, to relieve pain and swelling.  Doing gentle exercises to stretch the finger as told by your health care provider.   Having medicine that reduces swelling and inflammation (steroids) injected into the tendon sheath. Injections may need to be repeated.  Having surgery to open the tendon sheath. This may be done if other treatments do not work and you cannot straighten your finger. You may need physical therapy after surgery. Follow these instructions at home: If you have a splint:  Wear the splint as told by your health care provider. Remove it only as told by your health care provider.  Loosen it if your fingers tingle, become numb, or turn cold and blue.  Keep it clean.  If the splint is not waterproof: ? Do not let it get wet. ? Cover it with a watertight covering when you take a bath or shower. Managing pain, stiffness, and swelling     If directed, apply heat to the affected area as often as told by your health care provider. Use the heat source that your health care provider recommends, such as a moist heat pack or a heating pad.  Place a towel between your skin and the heat source.  Leave the heat on for 20-30 minutes.  Remove the heat if your skin turns bright red. This is especially important if you are unable to feel pain, heat, or cold. You may have a greater risk of getting burned. If directed, put ice on the painful area. To do this:  If you have a removable splint, remove it as told by your health care provider.  Put ice in a plastic bag.  Place a   towel between your skin and the bag or between your splint and the bag.  Leave the ice on for 20 minutes, 2-3 times a day.  Activity  Rest your finger as told by your health care provider. Avoid activities that make the pain worse.  Return to your normal activities as told by your health care provider. Ask your health care provider what activities are safe for you.  Do exercises as told by your health care provider.  Ask your health care provider when it is safe to drive if you have a splint on your hand. General instructions   Take over-the-counter and prescription medicines only as told by your health care provider.  Keep all follow-up visits as told by your health care provider. This is important. Contact a health care provider if:  Your symptoms are not improving with home care. Summary  Trigger finger, also called stenosing tenosynovitis, causes your finger to get stuck in a bent position. This can make it difficult and painful to straighten your finger.  This condition develops when a finger tendon or tendon sheath thickens.  Treatment may include resting your finger, wearing a splint, and taking medicines.  In severe cases, surgery to open the tendon sheath may be needed. This information is not intended to replace advice given to you by your health care provider. Make sure you discuss any questions you have with your health care provider. Document Revised: 02/27/2019 Document Reviewed: 02/27/2019 Elsevier Patient Education  2020 [employer]  

## 2020-05-01 NOTE — Progress Notes (Signed)
Subjective:  Patient ID: Jeffrey [name], male    DOB: Sep 12, 1951  Age: 69 y.o. MRN: [id number]  CC: Hand Pain (left middle finger) and Knee Pain (bilateral)   HPI Jeffrey [name] presents for L 4th finger triggering x worse C/o B knee stiffness and pain   Outpatient Medications Prior to Visit  Medication Sig Dispense Refill  . amoxicillin-clavulanate (AUGMENTIN) 875-125 MG tablet Take 1 tablet by mouth 2 (two) times daily. 20 tablet 0  . Ascorbic Acid (VITAMIN C) 100 MG tablet Take 1 tablet by mouth daily.    Marland Kitchen aspirin 81 MG tablet Take 81 mg by mouth daily.      . Cholecalciferol (VITAMIN D3) 2000 units TABS Take 1 tablet by mouth.    . cyclobenzaprine (FLEXERIL) 5 MG tablet Take 1 tablet (5 mg total) 3 (three) times daily as needed by mouth for muscle spasms. 30 tablet 1  . finasteride (PROSCAR) 5 MG tablet Take 1 tablet (5 mg total) by mouth daily. Overdue for yearly physical w/labs must see MF for refills 90 tablet 3  . Icosapent Ethyl (VASCEPA) 1 g CAPS Take 2 capsules (2 g total) by mouth 2 (two) times daily. 120 capsule 11  . omeprazole (PRILOSEC) 20 MG capsule Take 20 mg by mouth daily.    . sildenafil (REVATIO) 20 MG tablet Take 1-5 tablets (20-100 mg total) by mouth daily as needed. For ED 60 tablet 3  . tamsulosin (FLOMAX) 0.4 MG CAPS capsule Take 1 capsule (0.4 mg total) by mouth daily. Must keep upcoming appt to get refills 90 capsule 3  . telmisartan (MICARDIS) 80 MG tablet TAKE 1 TABLET(80 MG) BY MOUTH DAILY 90 tablet 3  . triamterene-hydrochlorothiazide (MAXZIDE-25) 37.5-25 MG tablet TAKE 1 TABLET BY MOUTH DAILY 90 tablet 3   No facility-administered medications prior to visit.    ROS: Review of Systems  Constitutional: Negative for appetite change, fatigue and unexpected weight change.  HENT: Negative for congestion, nosebleeds, sneezing, sore throat and trouble swallowing.   Eyes: Negative for itching and visual disturbance.  Respiratory: Negative for cough.     Cardiovascular: Negative for chest pain, palpitations and leg swelling.  Gastrointestinal: Negative for abdominal distention, blood in stool, diarrhea and nausea.  Genitourinary: Negative for frequency and hematuria.  Musculoskeletal: Positive for arthralgias and gait problem. Negative for back pain, joint swelling and neck pain.  Skin: Negative for rash.  Neurological: Negative for dizziness, tremors, speech difficulty and weakness.  Psychiatric/Behavioral: Negative for agitation, dysphoric mood and sleep disturbance. The patient is not nervous/anxious.     Objective:  BP 126/82   Pulse 64   Temp 98.3 F (36.8 C) (Oral)   Ht 6' (1.829 m)   Wt 253 lb (114.8 kg)   SpO2 97%   BMI 34.31 kg/m   BP Readings from Last 3 Encounters:  05/01/20 126/82  07/20/19 (!) 152/78  10/12/18 (!) 154/70    Wt Readings from Last 3 Encounters:  05/01/20 253 lb (114.8 kg)  07/20/19 252 lb (114.3 kg)  10/12/18 254 lb (115.2 kg)    Physical Exam Constitutional:      General: He is not in acute distress.    Appearance: He is well-developed.     Comments: NAD  Eyes:     Conjunctiva/sclera: Conjunctivae normal.     Pupils: Pupils are equal, round, and reactive to light.  Neck:     Thyroid: No thyromegaly.     Vascular: No JVD.  Cardiovascular:  Rate and Rhythm: Normal rate and regular rhythm.     Heart sounds: Normal heart sounds. No murmur heard.  No friction rub. No gallop.   Pulmonary:     Effort: Pulmonary effort is normal. No respiratory distress.     Breath sounds: Normal breath sounds. No wheezing or rales.  Chest:     Chest wall: No tenderness.  Abdominal:     General: Bowel sounds are normal. There is no distension.     Palpations: Abdomen is soft. There is no mass.     Tenderness: There is no abdominal tenderness. There is no guarding or rebound.  Musculoskeletal:        General: Tenderness and deformity present. Normal range of motion.     Cervical back: Normal range of  motion.  Lymphadenopathy:     Cervical: No cervical adenopathy.  Skin:    General: Skin is warm and dry.     Findings: No rash.  Neurological:     Mental Status: He is alert and oriented to person, place, and time.     Cranial Nerves: No cranial nerve deficit.     Motor: No abnormal muscle tone.     Coordination: Coordination normal.     Gait: Gait normal.     Deep Tendon Reflexes: Reflexes are normal and symmetric.  Psychiatric:        Behavior: Behavior normal.        Thought Content: Thought content normal.        Judgment: Judgment normal.    Scaly patch on L thigh Scaly lesions on scalp  B knees w/OA deformities L finger #4 triggering   Procedure Note :    Trigger finger tendon Injection:   Indication : Trigger finger L 4th   Risks including unsuccessful procedure , bleeding, infection, bruising, skin atrophy and others were explained to the patient in detail as well as the benefits. Informed consent was obtained and signed.   Tthe patient was placed in a comfortable position.    Flexor digitorum tendon was marked and  the skin was prepped with Betadine and alcohol. 1 inch 25-gauge needle was used. The needle was advanced  into the skin down to tendon. It  was injected with 0.5 mL of 2% lidocaine and 10 mg of Depo-Medrol in a usual fashion.  Band-Aids applied.   Tolerated well. Complications: None. Good pain relief following the procedure.   Lab Results  Component Value Date   WBC 8.4 07/20/2019   HGB 15.0 07/20/2019   HCT 45.0 07/20/2019   PLT 299.0 07/20/2019   GLUCOSE 86 07/20/2019   CHOL 173 07/20/2019   TRIG 60.0 07/20/2019   HDL 46.80 07/20/2019   LDLDIRECT 151.8 05/15/2010   LDLCALC 114 (H) 07/20/2019   ALT 11 07/20/2019   AST 16 07/20/2019   NA 141 07/20/2019   K 4.6 07/20/2019   CL 101 07/20/2019   CREATININE 1.00 07/20/2019   BUN 15 07/20/2019   CO2 34 (H) 07/20/2019   TSH 1.92 07/20/2019   PSA 4.46 (H) 07/20/2019   INR 0.93 08/12/2010     DG Shoulder Right  Result Date: 01/13/2018 CLINICAL DATA:  Fall 6 months ago.  Pain. EXAM: RIGHT SHOULDER - 2+ VIEW COMPARISON:  Chest x-ray 09/08/2017. FINDINGS: Acromioclavicular and glenohumeral degenerative change. No evidence of fracture or dislocation. IMPRESSION: Degenerative changes right shoulder.  Acute abnormality. Electronically Signed   By: Captain Cook   On: 01/13/2018 15:47   [place] LIMITED JOINT SPACE STRUCTURES  UP RIGHT  Result Date: 01/21/2018 [facility] US performed of: Right This study was ordered, performed, and interpreted by [name] D.O.  Shoulder:  Supraspinatus: Patient is a full-thickness tear noted with 0.8 cm of retraction noted.  Hypoechoic changes noted.  The underlying arthritic changes Subscapularis: Near full-thickness tear noted as well. AC joint: Moderate arthritis Glenohumeral Joint: Degenerative changes posteriorly. Glenoid Labrum:  Intact without visualized tears. Biceps Tendon: Full rupture noted. Impression: Full-thickness rotator cuff tear with retraction  Procedure: Real-time Ultrasound Guided Injection of right glenohumeral joint Device: GE Logiq Q7 Ultrasound guided injection is preferred based studies that show increased duration, increased effect, greater accuracy, decreased procedural pain, increased response rate with ultrasound guided versus blind injection. Verbal informed consent obtained. Time-out conducted. Noted no overlying erythema, induration, or other signs of local infection. Skin prepped in a sterile fashion. Local anesthesia: Topical Ethyl chloride. With sterile technique and under real time ultrasound guidance:  Joint visualized.  23g 1  inch needle inserted posterior approach. Pictures taken for needle placement. Patient did have injection of 2 cc of 1% lidocaine, 2 cc of 0.5% Marcaine, and 1.0 cc of Kenalog 40 mg/dL. Completed without difficulty Pain immediately resolved suggesting accurate placement of the medication. Advised to call if  fevers/chills, erythema, induration, drainage, or persistent bleeding. Images permanently stored and available for review in the ultrasound unit. Impression: Technically successful ultrasound guided injection.     Assessment & Plan:   There are no diagnoses linked to this encounter.   No orders of the defined types were placed in this encounter.    Follow-up: No follow-ups on file.  Walker Kehr, MD

## 2020-05-01 NOTE — Assessment & Plan Note (Signed)
See procedure 

## 2020-05-01 NOTE — Assessment & Plan Note (Signed)
Derm ref Dr Denna Haggard

## 2020-07-21 ENCOUNTER — Other Ambulatory Visit: Payer: Self-pay | Admitting: Internal Medicine

## 2020-07-23 ENCOUNTER — Other Ambulatory Visit: Payer: Self-pay | Admitting: Internal Medicine

## 2020-09-11 DIAGNOSIS — N401 Enlarged prostate with lower urinary tract symptoms: Secondary | ICD-10-CM | POA: Diagnosis not present

## 2020-09-11 DIAGNOSIS — R972 Elevated prostate specific antigen [PSA]: Secondary | ICD-10-CM | POA: Diagnosis not present

## 2020-09-11 DIAGNOSIS — R350 Frequency of micturition: Secondary | ICD-10-CM | POA: Diagnosis not present

## 2020-09-11 DIAGNOSIS — N5201 Erectile dysfunction due to arterial insufficiency: Secondary | ICD-10-CM | POA: Diagnosis not present

## 2020-09-12 DIAGNOSIS — Z23 Encounter for immunization: Secondary | ICD-10-CM | POA: Diagnosis not present

## 2020-10-31 ENCOUNTER — Ambulatory Visit: Payer: Medicare Other | Admitting: Internal Medicine

## 2020-11-05 ENCOUNTER — Other Ambulatory Visit: Payer: Self-pay

## 2020-11-05 ENCOUNTER — Ambulatory Visit (INDEPENDENT_AMBULATORY_CARE_PROVIDER_SITE_OTHER): Payer: Medicare Other | Admitting: Dermatology

## 2020-11-05 ENCOUNTER — Encounter: Payer: Self-pay | Admitting: Dermatology

## 2020-11-05 DIAGNOSIS — D1800 Hemangioma unspecified site: Secondary | ICD-10-CM

## 2020-11-05 DIAGNOSIS — L821 Other seborrheic keratosis: Secondary | ICD-10-CM | POA: Diagnosis not present

## 2020-11-05 DIAGNOSIS — D0472 Carcinoma in situ of skin of left lower limb, including hip: Secondary | ICD-10-CM | POA: Diagnosis not present

## 2020-11-05 DIAGNOSIS — Z1283 Encounter for screening for malignant neoplasm of skin: Secondary | ICD-10-CM | POA: Diagnosis not present

## 2020-11-05 DIAGNOSIS — D485 Neoplasm of uncertain behavior of skin: Secondary | ICD-10-CM

## 2020-11-05 DIAGNOSIS — C4492 Squamous cell carcinoma of skin, unspecified: Secondary | ICD-10-CM

## 2020-11-05 HISTORY — DX: Squamous cell carcinoma of skin, unspecified: C44.92

## 2020-11-05 NOTE — Patient Instructions (Signed)

## 2020-11-08 ENCOUNTER — Other Ambulatory Visit: Payer: Self-pay

## 2020-11-08 ENCOUNTER — Telehealth: Payer: Self-pay

## 2020-11-08 ENCOUNTER — Encounter: Payer: Self-pay | Admitting: Dermatology

## 2020-11-08 ENCOUNTER — Encounter: Payer: Self-pay | Admitting: Internal Medicine

## 2020-11-08 ENCOUNTER — Ambulatory Visit (INDEPENDENT_AMBULATORY_CARE_PROVIDER_SITE_OTHER): Payer: Medicare Other | Admitting: Internal Medicine

## 2020-11-08 DIAGNOSIS — K439 Ventral hernia without obstruction or gangrene: Secondary | ICD-10-CM | POA: Diagnosis not present

## 2020-11-08 DIAGNOSIS — I10 Essential (primary) hypertension: Secondary | ICD-10-CM

## 2020-11-08 DIAGNOSIS — K429 Umbilical hernia without obstruction or gangrene: Secondary | ICD-10-CM

## 2020-11-08 DIAGNOSIS — R1031 Right lower quadrant pain: Secondary | ICD-10-CM

## 2020-11-08 LAB — CBC WITH DIFFERENTIAL/PLATELET
Basophils Absolute: 0.1 10*3/uL (ref 0.0–0.1)
Basophils Relative: 1.2 % (ref 0.0–3.0)
Eosinophils Absolute: 0.2 10*3/uL (ref 0.0–0.7)
Eosinophils Relative: 2 % (ref 0.0–5.0)
HCT: 45.4 % (ref 39.0–52.0)
Hemoglobin: 15.1 g/dL (ref 13.0–17.0)
Lymphocytes Relative: 22.4 % (ref 12.0–46.0)
Lymphs Abs: 2 10*3/uL (ref 0.7–4.0)
MCHC: 33.2 g/dL (ref 30.0–36.0)
MCV: 96.8 fl (ref 78.0–100.0)
Monocytes Absolute: 1.3 10*3/uL — ABNORMAL HIGH (ref 0.1–1.0)
Monocytes Relative: 14.7 % — ABNORMAL HIGH (ref 3.0–12.0)
Neutro Abs: 5.2 10*3/uL (ref 1.4–7.7)
Neutrophils Relative %: 59.7 % (ref 43.0–77.0)
Platelets: 352 10*3/uL (ref 150.0–400.0)
RBC: 4.7 Mil/uL (ref 4.22–5.81)
RDW: 13.8 % (ref 11.5–15.5)
WBC: 8.7 10*3/uL (ref 4.0–10.5)

## 2020-11-08 LAB — COMPREHENSIVE METABOLIC PANEL
ALT: 12 U/L (ref 0–53)
AST: 16 U/L (ref 0–37)
Albumin: 3.8 g/dL (ref 3.5–5.2)
Alkaline Phosphatase: 73 U/L (ref 39–117)
BUN: 16 mg/dL (ref 6–23)
CO2: 33 mEq/L — ABNORMAL HIGH (ref 19–32)
Calcium: 9.5 mg/dL (ref 8.4–10.5)
Chloride: 101 mEq/L (ref 96–112)
Creatinine, Ser: 1.03 mg/dL (ref 0.40–1.50)
GFR: 73.45 mL/min (ref >60.00)
Glucose, Bld: 100 mg/dL — ABNORMAL HIGH (ref 70–99)
Potassium: 4.6 mEq/L (ref 3.5–5.1)
Sodium: 138 mEq/L (ref 135–145)
Total Bilirubin: 1 mg/dL (ref 0.2–1.2)
Total Protein: 7 g/dL (ref 6.0–8.3)

## 2020-11-08 LAB — SEDIMENTATION RATE: Sed Rate: 21 mm/hr — ABNORMAL HIGH (ref 0–20)

## 2020-11-08 MED ORDER — TELMISARTAN 80 MG PO TABS: ORAL_TABLET | ORAL | 3 refills | Status: DC

## 2020-11-08 MED ORDER — TRIAMTERENE-HCTZ 37.5-25 MG PO TABS: 1.0000 | ORAL_TABLET | Freq: Every day | ORAL | 3 refills | Status: DC

## 2020-11-08 MED ORDER — CELECOXIB 100 MG PO CAPS: 100.0000 mg | ORAL_CAPSULE | Freq: Every day | ORAL | 1 refills | Status: DC

## 2020-11-08 NOTE — Assessment & Plan Note (Signed)
Worse Loose wt 

## 2020-11-08 NOTE — Telephone Encounter (Signed)
-----   Message from Lavonna Monarch, MD sent at 11/08/2020  6:19 AM EST ----- Schedule surgery with Dr. Darene Lamer

## 2020-11-08 NOTE — Assessment & Plan Note (Signed)
Large midline hernia Large umbilical hernia 7 cm - ?worse Abd CT

## 2020-11-08 NOTE — Telephone Encounter (Signed)
Phone call to patient with his pathology results. Patient aware.  

## 2020-11-08 NOTE — Addendum Note (Signed)
Addended by: Raliegh Ip on: 11/08/2020 08:33 AM   Modules accepted: Orders

## 2020-11-08 NOTE — Progress Notes (Signed)
   New Patient   Subjective  Jeffrey [name] is a 70 y.o. male who presents for the following: Annual Exam (Left thigh crust have checked per pcp, and scalp crust checked years ago no concern).  General skin examination Location:  Duration: Newer crust left thigh Quality:  Associated Signs/Symptoms: Modifying Factors:  Severity:  Timing: Context:    The following portions of the chart were reviewed this encounter and updated as appropriate:  Tobacco  Allergies  Meds  Problems  Med Hx  Surg Hx  Fam Hx      Objective  Well appearing patient in no apparent distress; mood and affect are within normal limits. Objective  Chest - Medial [facility]): Full body skin examination: No atypical pigmented spots.  Objective  Right Lower Back: 5 mm smooth raised red papule  Objective  Left Lower Back: Pink plus brown polka dotted waxy 1 cm flat papule     Objective  Left Thigh - Anterior: Waxy pink 8 mm cobblestoned papule       A full examination was performed including scalp, head, eyes, ears, nose, lips, neck, chest, axillae, abdomen, back, buttocks, bilateral upper extremities, bilateral lower extremities, hands, feet, fingers, toes, fingernails, and toenails. All findings within normal limits unless otherwise noted below.   Assessment & Plan  Screening exam for skin cancer Chest - Medial [facility])  Yearly skin check, patient encouraged to self examine twice annually.  Hemangioma, unspecified site Right Lower Back  Okay to leave if stable  Neoplasm of uncertain behavior of skin (2) Left Lower Back  Skin / nail biopsy Type of biopsy: tangential   Informed consent: discussed and consent obtained   Timeout: patient name, date of birth, surgical site, and procedure verified   Procedure prep:  Patient was prepped and draped in usual sterile fashion (Non sterile) Prep type:  Chlorhexidine Anesthesia: the lesion was anesthetized in a standard fashion   Anesthetic:   1% lidocaine w/ epinephrine 1-100,000 local infiltration Instrument used: flexible razor blade   Outcome: patient tolerated procedure well   Post-procedure details: wound care instructions given    Specimen 1 - Surgical pathology Differential Diagnosis: r/o atypia, sk Check Margins: No  Left Thigh - Anterior  Skin / nail biopsy Type of biopsy: tangential   Informed consent: discussed and consent obtained   Timeout: patient name, date of birth, surgical site, and procedure verified   Procedure prep:  Patient was prepped and draped in usual sterile fashion (Non sterile) Prep type:  Chlorhexidine Anesthesia: the lesion was anesthetized in a standard fashion   Anesthetic:  1% lidocaine w/ epinephrine 1-100,000 local infiltration Instrument used: flexible razor blade   Outcome: patient tolerated procedure well   Post-procedure details: wound care instructions given    Specimen 2 - Surgical pathology Differential Diagnosis: r/o atypia, sk  Check Margins: No

## 2020-11-08 NOTE — Assessment & Plan Note (Addendum)
?  cause  RLQ abd pain x 6 months off and on; last time last week x 2 d. H/o hernia surgery 2012. Large midline hernia Large umbilical hernia 7 cm ?adhesions vs other Abd CT. CBC, ESR, CMET Loose wt Surg ref offered

## 2020-11-08 NOTE — Progress Notes (Signed)
Subjective:  Patient ID: Jeffrey [name], male    DOB: 04/17/1951  Age: 70 y.o. MRN: [id number]  CC: Follow-up   HPI Jeffrey [name] presents for HTN C/o RLQ abd pain x 6 months off and on; last time last week x 2 d. H/o hernia surgery 2011 - 2012. C/o wt gain F/u trigger finger - better, but still having a problem   Outpatient Medications Prior to Visit  Medication Sig Dispense Refill  . amoxicillin-clavulanate (AUGMENTIN) 875-125 MG tablet Take 1 tablet by mouth 2 (two) times daily. 20 tablet 0  . Ascorbic Acid (VITAMIN C) 100 MG tablet Take 1 tablet by mouth daily.    Marland Kitchen aspirin 81 MG tablet Take 81 mg by mouth daily.    . celecoxib (CELEBREX) 100 MG capsule Take 1 capsule (100 mg total) by mouth daily. 90 capsule 1  . Cholecalciferol (VITAMIN D3) 2000 units TABS Take 1 tablet by mouth.    . finasteride (PROSCAR) 5 MG tablet Take 1 tablet (5 mg total) by mouth daily. Overdue for yearly physical w/labs must see MF for refills 90 tablet 3  . Icosapent Ethyl (VASCEPA) 1 g CAPS Take 2 capsules (2 g total) by mouth 2 (two) times daily. 120 capsule 11  . omeprazole (PRILOSEC) 20 MG capsule Take 20 mg by mouth daily.    . sildenafil (REVATIO) 20 MG tablet Take 1-5 tablets (20-100 mg total) by mouth daily as needed. For ED 60 tablet 3  . tamsulosin (FLOMAX) 0.4 MG CAPS capsule TAKE 1 CAPSULE(0.4 MG) BY MOUTH DAILY 90 capsule 3  . telmisartan (MICARDIS) 80 MG tablet TAKE 1 TABLET(80 MG) BY MOUTH DAILY 90 tablet 3  . triamterene-hydrochlorothiazide (MAXZIDE-25) 37.5-25 MG tablet TAKE 1 TABLET BY MOUTH DAILY 90 tablet 3  . cyclobenzaprine (FLEXERIL) 5 MG tablet Take 1 tablet (5 mg total) 3 (three) times daily as needed by mouth for muscle spasms. (Patient not taking: Reported on 11/08/2020) 30 tablet 1   No facility-administered medications prior to visit.    ROS: Review of Systems  Constitutional: Positive for unexpected weight change. Negative for appetite change and fatigue.  HENT:  Negative for congestion, nosebleeds, sneezing, sore throat and trouble swallowing.   Eyes: Negative for itching and visual disturbance.  Respiratory: Negative for cough.   Cardiovascular: Negative for chest pain, palpitations and leg swelling.  Gastrointestinal: Positive for abdominal distention and abdominal pain. Negative for blood in stool, diarrhea and nausea.  Genitourinary: Negative for frequency and hematuria.  Musculoskeletal: Positive for arthralgias. Negative for back pain, gait problem, joint swelling and neck pain.  Skin: Negative for rash.  Neurological: Negative for dizziness, tremors, speech difficulty and weakness.  Psychiatric/Behavioral: Negative for agitation, dysphoric mood and sleep disturbance. The patient is not nervous/anxious.     Objective:  BP (!) 152/88 (BP Location: Right Arm, Patient Position: Sitting, Cuff Size: Large)   Pulse 62   Temp 98.3 F (36.8 C) (Oral)   Ht 6' (1.829 m)   Wt 252 lb (114.3 kg)   SpO2 94%   BMI 34.18 kg/m   BP Readings from Last 3 Encounters:  11/08/20 (!) 152/88  05/01/20 126/82  07/20/19 (!) 152/78    Wt Readings from Last 3 Encounters:  11/08/20 252 lb (114.3 kg)  05/01/20 253 lb (114.8 kg)  07/20/19 252 lb (114.3 kg)    Physical Exam Constitutional:      General: He is not in acute distress.    Appearance: He is well-developed.  Comments: NAD  HENT:     Mouth/Throat:     Mouth: Oropharynx is clear and moist.  Eyes:     Conjunctiva/sclera: Conjunctivae normal.     Pupils: Pupils are equal, round, and reactive to light.  Neck:     Thyroid: No thyromegaly.     Vascular: No JVD.  Cardiovascular:     Rate and Rhythm: Normal rate and regular rhythm.     Pulses: Intact distal pulses.     Heart sounds: Normal heart sounds. No murmur heard. No friction rub. No gallop.   Pulmonary:     Effort: Pulmonary effort is normal. No respiratory distress.     Breath sounds: Normal breath sounds. No wheezing or rales.   Chest:     Chest wall: No tenderness.  Abdominal:     General: Bowel sounds are normal. There is distension.     Palpations: Abdomen is soft. There is no mass.     Tenderness: There is no abdominal tenderness. There is no guarding or rebound.     Hernia: A hernia is present.  Musculoskeletal:        General: No tenderness or edema. Normal range of motion.     Cervical back: Normal range of motion.  Lymphadenopathy:     Cervical: No cervical adenopathy.  Skin:    General: Skin is warm and dry.     Findings: No rash.  Neurological:     Mental Status: He is alert and oriented to person, place, and time.     Cranial Nerves: No cranial nerve deficit.     Motor: No abnormal muscle tone.     Coordination: He displays a negative Romberg sign. Coordination normal.     Gait: Gait normal.     Deep Tendon Reflexes: Reflexes are normal and symmetric.  Psychiatric:        Mood and Affect: Mood and affect normal.        Behavior: Behavior normal.        Thought Content: Thought content normal.        Judgment: Judgment normal.    abd scar Large midline hernia Large umbilical hernia 7 cm  Lab Results  Component Value Date   WBC 8.4 07/20/2019   HGB 15.0 07/20/2019   HCT 45.0 07/20/2019   PLT 299.0 07/20/2019   GLUCOSE 86 07/20/2019   CHOL 173 07/20/2019   TRIG 60.0 07/20/2019   HDL 46.80 07/20/2019   LDLDIRECT 151.8 05/15/2010   LDLCALC 114 (H) 07/20/2019   ALT 11 07/20/2019   AST 16 07/20/2019   NA 141 07/20/2019   K 4.6 07/20/2019   CL 101 07/20/2019   CREATININE 1.00 07/20/2019   BUN 15 07/20/2019   CO2 34 (H) 07/20/2019   TSH 1.92 07/20/2019   PSA 4.46 (H) 07/20/2019   INR 0.93 08/12/2010    DG Shoulder Right  Result Date: 01/13/2018 CLINICAL DATA:  Fall 6 months ago.  Pain. EXAM: RIGHT SHOULDER - 2+ VIEW COMPARISON:  Chest x-ray 09/08/2017. FINDINGS: Acromioclavicular and glenohumeral degenerative change. No evidence of fracture or dislocation. IMPRESSION:  Degenerative changes right shoulder.  Acute abnormality. Electronically Signed   By: Marcello Moores  Register   On: 01/13/2018 15:47   [place] LIMITED JOINT SPACE STRUCTURES UP RIGHT  Result Date: 01/21/2018 [facility] US performed of: Right This study was ordered, performed, and interpreted by [name] D.O.  Shoulder:  Supraspinatus: Patient is a full-thickness tear noted with 0.8 cm of retraction noted.  Hypoechoic changes noted.  The underlying arthritic changes Subscapularis: Near full-thickness tear noted as well. AC joint: Moderate arthritis Glenohumeral Joint: Degenerative changes posteriorly. Glenoid Labrum:  Intact without visualized tears. Biceps Tendon: Full rupture noted. Impression: Full-thickness rotator cuff tear with retraction  Procedure: Real-time Ultrasound Guided Injection of right glenohumeral joint Device: GE Logiq Q7 Ultrasound guided injection is preferred based studies that show increased duration, increased effect, greater accuracy, decreased procedural pain, increased response rate with ultrasound guided versus blind injection. Verbal informed consent obtained. Time-out conducted. Noted no overlying erythema, induration, or other signs of local infection. Skin prepped in a sterile fashion. Local anesthesia: Topical Ethyl chloride. With sterile technique and under real time ultrasound guidance:  Joint visualized.  23g 1  inch needle inserted posterior approach. Pictures taken for needle placement. Patient did have injection of 2 cc of 1% lidocaine, 2 cc of 0.5% Marcaine, and 1.0 cc of Kenalog 40 mg/dL. Completed without difficulty Pain immediately resolved suggesting accurate placement of the medication. Advised to call if fevers/chills, erythema, induration, drainage, or persistent bleeding. Images permanently stored and available for review in the ultrasound unit. Impression: Technically successful ultrasound guided injection.     Assessment & Plan:    Walker Kehr, MD

## 2020-11-08 NOTE — Assessment & Plan Note (Signed)
Worse CT abd

## 2020-11-09 ENCOUNTER — Other Ambulatory Visit: Payer: Self-pay | Admitting: Internal Medicine

## 2020-11-28 ENCOUNTER — Ambulatory Visit (INDEPENDENT_AMBULATORY_CARE_PROVIDER_SITE_OTHER): Payer: Medicare Other | Admitting: Dermatology

## 2020-11-28 ENCOUNTER — Other Ambulatory Visit: Payer: Self-pay

## 2020-11-28 ENCOUNTER — Encounter: Payer: Medicare Other | Admitting: Dermatology

## 2020-11-28 ENCOUNTER — Encounter: Payer: Self-pay | Admitting: Dermatology

## 2020-11-28 DIAGNOSIS — D0472 Carcinoma in situ of skin of left lower limb, including hip: Secondary | ICD-10-CM | POA: Diagnosis not present

## 2020-11-28 DIAGNOSIS — D099 Carcinoma in situ, unspecified: Secondary | ICD-10-CM

## 2020-11-28 NOTE — Patient Instructions (Addendum)
Biopsy, Surgery (Curettage) & Surgery (Excision) Aftercare Instructions  1. Okay to remove bandage in 24 hours  2. Wash area with soap and water  3. Apply Vaseline to area twice daily until healed (Not Neosporin)  4. Okay to cover with a Band-Aid to decrease the chance of infection or prevent irritation from clothing; also it's okay to uncover lesion at home.  5. Suture instructions: return to our office in 7-10 or 10-14 days for a nurse visit for suture removal. Variable healing with sutures, if pain or itching occurs call our office. It's okay to shower or bathe 24 hours after sutures are given.  6. The following risks may occur after a biopsy, curettage or excision: bleeding, scarring, discoloration, recurrence, infection (redness, yellow drainage, pain or swelling).  7. For questions, concerns and results call our office at [facility] before 4pm & Friday before 3pm. Biopsy results will be available in 1 week.   Follow up by phone in 3-4 weeks to let us know how your healing.

## 2020-12-02 ENCOUNTER — Ambulatory Visit
Admission: RE | Admit: 2020-12-02 | Discharge: 2020-12-02 | Disposition: A | Discharge status: Home / Self Care | Payer: Medicare Other | Source: Ambulatory Visit | Attending: Internal Medicine | Admitting: Internal Medicine

## 2020-12-02 DIAGNOSIS — R1031 Right lower quadrant pain: Secondary | ICD-10-CM

## 2020-12-02 DIAGNOSIS — K4020 Bilateral inguinal hernia, without obstruction or gangrene, not specified as recurrent: Secondary | ICD-10-CM | POA: Diagnosis not present

## 2020-12-02 DIAGNOSIS — M47816 Spondylosis without myelopathy or radiculopathy, lumbar region: Secondary | ICD-10-CM | POA: Diagnosis not present

## 2020-12-02 DIAGNOSIS — N400 Enlarged prostate without lower urinary tract symptoms: Secondary | ICD-10-CM | POA: Diagnosis not present

## 2020-12-02 DIAGNOSIS — I700 Atherosclerosis of aorta: Secondary | ICD-10-CM | POA: Diagnosis not present

## 2020-12-02 MED ORDER — IOPAMIDOL (ISOVUE-300) INJECTION 61%
100.0000 mL | Freq: Once | INTRAVENOUS | Status: AC | PRN
  Administered 2020-12-02: 100 mL via INTRAVENOUS

## 2020-12-04 ENCOUNTER — Telehealth: Payer: Self-pay | Admitting: Internal Medicine

## 2020-12-04 ENCOUNTER — Telehealth: Payer: Self-pay | Admitting: Dermatology

## 2020-12-04 NOTE — Telephone Encounter (Signed)
Patient left message on office voice mail saying that the surgical site is doing fine.  It is healing with no problems.

## 2020-12-04 NOTE — Telephone Encounter (Signed)
Patient returned call in regards to CT results. [phone].

## 2020-12-04 NOTE — Telephone Encounter (Signed)
Patient called again in regards to CT results. He can be reached at [phone].

## 2020-12-04 NOTE — Telephone Encounter (Signed)
Notified pt w/CT results.Marland KitchenLind [name]

## 2020-12-08 ENCOUNTER — Encounter: Payer: Self-pay | Admitting: Dermatology

## 2020-12-08 NOTE — Progress Notes (Signed)
   Follow-Up Visit   Subjective  Jeffrey [name] is a 70 y.o. male who presents for the following: Procedure (CIS x 1 left thigh ).  CIS Location:  Duration:  Quality:  Associated Signs/Symptoms: Modifying Factors:  Severity:  Timing: Context: For treatment  Objective  Well appearing patient in no apparent distress; mood and affect are within normal limits. Objective  Left Thigh - Anterior: Lesion identified by Dr.Yuji Walth and nurse in room.      A focused examination was performed including Head, arms, legs.. Relevant physical exam findings are noted in the Assessment and Plan.   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  Destruction of lesion Complexity: simple   Destruction method: electrodesiccation and curettage   Informed consent: discussed and consent obtained   Timeout:  patient name, date of birth, surgical site, and procedure verified Anesthesia: the lesion was anesthetized in a standard fashion   Anesthetic:  1% lidocaine w/ epinephrine 1-100,000 local infiltration Curettage performed in three different directions: Yes   Electrodesiccation performed over the curetted area: Yes   Curettage cycles:  3 Lesion length (cm):  1.6 Lesion width (cm):  1.6 Margin per side (cm):  0 Final wound size (cm):  1.6 Hemostasis achieved with:  ferric subsulfate and electrodesiccation Outcome: patient tolerated procedure well with no complications   Post-procedure details: sterile dressing applied   Dressing type: bandage and petrolatum   Additional details:  Wound innoculated with 5 fluorouracil solution.      I, Lavonna Monarch, MD, have reviewed all documentation for this visit.  The documentation on 12/08/20 for the exam, diagnosis, procedures, and orders are all accurate and complete.

## 2020-12-17 ENCOUNTER — Ambulatory Visit (INDEPENDENT_AMBULATORY_CARE_PROVIDER_SITE_OTHER): Payer: Medicare Other | Admitting: Internal Medicine

## 2020-12-17 ENCOUNTER — Encounter: Payer: Self-pay | Admitting: Internal Medicine

## 2020-12-17 ENCOUNTER — Other Ambulatory Visit: Payer: Self-pay

## 2020-12-17 DIAGNOSIS — N2889 Other specified disorders of kidney and ureter: Secondary | ICD-10-CM

## 2020-12-17 DIAGNOSIS — E785 Hyperlipidemia, unspecified: Secondary | ICD-10-CM

## 2020-12-17 DIAGNOSIS — I700 Atherosclerosis of aorta: Secondary | ICD-10-CM | POA: Diagnosis not present

## 2020-12-17 NOTE — Assessment & Plan Note (Signed)
CT -  Indeterminate 1.6 cm nodule rising from the interpolar region of the right kidney on CT. Further evaluation with a nonemergent outpatient renal ultrasound is recommended.  Renal US

## 2020-12-17 NOTE — Assessment & Plan Note (Addendum)
ASA  Omega 3  Info re cardiac CT scan for calcium scoring given

## 2020-12-17 NOTE — Patient Instructions (Signed)

## 2020-12-17 NOTE — Progress Notes (Signed)
Subjective:  Patient ID: Jeffrey [name], male    DOB: 04-Jan-1951  Age: 70 y.o. MRN: [id number]  CC: Follow-up (F/u on CT)   HPI Jeffrey [name] presents for RLQ abd pain - better. CT was ok.  Indeterminate 1.6 cm nodule rising from the interpolar region of the right kidney on CT. Further evaluation with a nonemergent outpatient renal ultrasound is recommended.  Outpatient Medications Prior to Visit  Medication Sig Dispense Refill  . Ascorbic Acid (VITAMIN C) 100 MG tablet Take 1 tablet by mouth daily.    Marland Kitchen aspirin 81 MG tablet Take 81 mg by mouth daily.    . celecoxib (CELEBREX) 100 MG capsule Take 1 capsule (100 mg total) by mouth daily. 90 capsule 1  . Cholecalciferol (VITAMIN D3) 2000 units TABS Take 1 tablet by mouth.    . finasteride (PROSCAR) 5 MG tablet Take 1 tablet (5 mg total) by mouth daily. Overdue for yearly physical w/labs must see MF for refills 90 tablet 3  . omeprazole (PRILOSEC) 20 MG capsule Take 20 mg by mouth daily.    . sildenafil (REVATIO) 20 MG tablet Take 1-5 tablets (20-100 mg total) by mouth daily as needed. For ED 60 tablet 3  . tamsulosin (FLOMAX) 0.4 MG CAPS capsule TAKE 1 CAPSULE(0.4 MG) BY MOUTH DAILY 90 capsule 3  . telmisartan (MICARDIS) 80 MG tablet TAKE 1 TABLET(80 MG) BY MOUTH DAILY 90 tablet 3  . triamterene-hydrochlorothiazide (MAXZIDE-25) 37.5-25 MG tablet TAKE 1 TABLET BY MOUTH DAILY 90 tablet 3  . amoxicillin-clavulanate (AUGMENTIN) 875-125 MG tablet Take 1 tablet by mouth 2 (two) times daily. 20 tablet 0  . cyclobenzaprine (FLEXERIL) 5 MG tablet Take 1 tablet (5 mg total) 3 (three) times daily as needed by mouth for muscle spasms. (Patient not taking: No sig reported) 30 tablet 1  . Icosapent Ethyl (VASCEPA) 1 g CAPS Take 2 capsules (2 g total) by mouth 2 (two) times daily. (Patient not taking: Reported on 12/17/2020) 120 capsule 11   No facility-administered medications prior to visit.    ROS: Review of Systems  Constitutional: Negative  for appetite change, fatigue and unexpected weight change.  HENT: Negative for congestion, nosebleeds, sneezing, sore throat and trouble swallowing.   Eyes: Negative for itching and visual disturbance.  Respiratory: Negative for cough.   Cardiovascular: Negative for chest pain, palpitations and leg swelling.  Gastrointestinal: Positive for abdominal pain. Negative for abdominal distention, blood in stool, diarrhea and nausea.  Genitourinary: Negative for frequency and hematuria.  Musculoskeletal: Negative for back pain, gait problem, joint swelling and neck pain.  Skin: Negative for rash.  Neurological: Negative for dizziness, tremors, speech difficulty and weakness.  Psychiatric/Behavioral: Negative for agitation, dysphoric mood and sleep disturbance. The patient is not nervous/anxious.     Objective:  BP 138/78 (BP Location: Left Arm)   Pulse 71   Temp 98.1 F (36.7 C) (Oral)   SpO2 94%   BP Readings from Last 3 Encounters:  12/17/20 138/78  11/08/20 (!) 152/88  05/01/20 126/82    Wt Readings from Last 3 Encounters:  11/08/20 252 lb (114.3 kg)  05/01/20 253 lb (114.8 kg)  07/20/19 252 lb (114.3 kg)    Physical Exam Constitutional:      General: He is not in acute distress.    Appearance: He is well-developed. He is obese.     Comments: NAD  HENT:     Mouth/Throat:     Mouth: Oropharynx is clear and moist.  Eyes:  Conjunctiva/sclera: Conjunctivae normal.     Pupils: Pupils are equal, round, and reactive to light.  Neck:     Thyroid: No thyromegaly.     Vascular: No JVD.  Cardiovascular:     Rate and Rhythm: Normal rate and regular rhythm.     Pulses: Intact distal pulses.     Heart sounds: Normal heart sounds. No murmur heard. No friction rub. No gallop.   Pulmonary:     Effort: Pulmonary effort is normal. No respiratory distress.     Breath sounds: Normal breath sounds. No wheezing or rales.  Chest:     Chest wall: No tenderness.  Abdominal:     General:  Bowel sounds are normal. There is no distension.     Palpations: Abdomen is soft. There is no mass.     Tenderness: There is no abdominal tenderness. There is no guarding or rebound.  Musculoskeletal:        General: No tenderness or edema. Normal range of motion.     Cervical back: Normal range of motion.  Lymphadenopathy:     Cervical: No cervical adenopathy.  Skin:    General: Skin is warm and dry.     Findings: No rash.  Neurological:     Mental Status: He is alert and oriented to person, place, and time.     Cranial Nerves: No cranial nerve deficit.     Motor: No abnormal muscle tone.     Coordination: He displays a negative Romberg sign. Coordination normal.     Gait: Gait normal.     Deep Tendon Reflexes: Reflexes are normal and symmetric.  Psychiatric:        Mood and Affect: Mood and affect normal.        Behavior: Behavior normal.        Thought Content: Thought content normal.        Judgment: Judgment normal.     Lab Results  Component Value Date   WBC 8.7 11/08/2020   HGB 15.1 11/08/2020   HCT 45.4 11/08/2020   PLT 352.0 11/08/2020   GLUCOSE 100 (H) 11/08/2020   CHOL 173 07/20/2019   TRIG 60.0 07/20/2019   HDL 46.80 07/20/2019   LDLDIRECT 151.8 05/15/2010   LDLCALC 114 (H) 07/20/2019   ALT 12 11/08/2020   AST 16 11/08/2020   NA 138 11/08/2020   K 4.6 11/08/2020   CL 101 11/08/2020   CREATININE 1.03 11/08/2020   BUN 16 11/08/2020   CO2 33 (H) 11/08/2020   TSH 1.92 07/20/2019   PSA 4.46 (H) 07/20/2019   INR 0.93 08/12/2010    CT Abdomen Pelvis W Contrast  Result Date: 12/02/2020 CLINICAL DATA:  Right lower quadrant abdominal pain x1 month. History of hernia repair with mesh. Status post splenectomy. EXAM: CT ABDOMEN AND PELVIS WITH CONTRAST TECHNIQUE: Multidetector CT imaging of the abdomen and pelvis was performed using the standard protocol following bolus administration of intravenous contrast. CONTRAST:  12mL ISOVUE-300 IOPAMIDOL (ISOVUE-300)  INJECTION 61% COMPARISON:  None. FINDINGS: Lower chest: The lung bases are clear. The heart size is normal. Hepatobiliary: The liver is normal. Cholelithiasis without acute inflammation.There is no biliary ductal dilation. Pancreas: Normal contours without ductal dilatation. No peripancreatic fluid collection. Spleen: The spleen is absent. Adrenals/Urinary Tract: --Adrenal glands: Unremarkable. --Right kidney/ureter: There is an exophytic nodule rising from the interpolar region of the right kidney measuring approximately 1.6 cm and 27 Hounsfield units. This is technically indeterminate. There is no hydronephrosis. --Left kidney/ureter: No hydronephrosis or  radiopaque kidney stones. --Urinary bladder: Unremarkable. Stomach/Bowel: --Stomach/Duodenum: No hiatal hernia or other gastric abnormality. Normal duodenal course and caliber. --Small bowel: Unremarkable. --Colon: There is scattered colonic diverticula without CT evidence for diverticulitis. --Appendix: Not visualized. No right lower quadrant inflammation or free fluid. Vascular/Lymphatic: Atherosclerotic calcification is present within the non-aneurysmal abdominal aorta, without hemodynamically significant stenosis. --No retroperitoneal lymphadenopathy. --No mesenteric lymphadenopathy. --No pelvic or inguinal lymphadenopathy. Reproductive: Prostate gland is heterogeneous and enlarged. Other: No ascites or free air. There are bilateral fat containing inguinal hernias, left greater than right. Musculoskeletal. There are advanced multilevel degenerative changes throughout the lumbar spine. IMPRESSION: 1. No acute abdominal or pelvic pathology. 2. Cholelithiasis without acute inflammation. 3. Indeterminate 1.6 cm nodule rising from the interpolar region of the right kidney. Further evaluation with a nonemergent outpatient renal ultrasound is recommended. 4. Bilateral fat containing inguinal hernias, left greater than right. 5. Enlarged heterogeneous prostate gland.  Aortic Atherosclerosis (ICD10-I70.0). Electronically Signed   By: Constance Holster M.D.   On: 12/02/2020 17:45    Assessment & Plan:   There are no diagnoses linked to this encounter.   No orders of the defined types were placed in this encounter.    Follow-up: No follow-ups on file.  Walker Kehr, MD

## 2020-12-17 NOTE — Assessment & Plan Note (Addendum)
Info re cardiac CT scan for calcium scoring given Omega 3

## 2020-12-18 ENCOUNTER — Telehealth: Payer: Self-pay | Admitting: Internal Medicine

## 2020-12-18 NOTE — Telephone Encounter (Signed)
LVM for pt to rtn my call to schedule AWV with [facility]. Please schedule AWV with [facility] if pt calls the office.

## 2020-12-20 ENCOUNTER — Other Ambulatory Visit: Payer: Self-pay

## 2020-12-20 ENCOUNTER — Ambulatory Visit
Admission: RE | Admit: 2020-12-20 | Discharge: 2020-12-20 | Disposition: A | Discharge status: Home / Self Care | Payer: Medicare Other | Source: Ambulatory Visit | Attending: Internal Medicine | Admitting: Internal Medicine

## 2020-12-20 DIAGNOSIS — N281 Cyst of kidney, acquired: Secondary | ICD-10-CM | POA: Diagnosis not present

## 2020-12-20 DIAGNOSIS — N2889 Other specified disorders of kidney and ureter: Secondary | ICD-10-CM

## 2020-12-22 ENCOUNTER — Other Ambulatory Visit: Payer: Self-pay | Admitting: Internal Medicine

## 2020-12-22 DIAGNOSIS — N281 Cyst of kidney, acquired: Secondary | ICD-10-CM

## 2020-12-23 ENCOUNTER — Telehealth: Payer: Self-pay | Admitting: Internal Medicine

## 2020-12-23 NOTE — Telephone Encounter (Signed)
Tried calling pt bck # left was no the correct #. Called home # there was no answer LMOM RTC.( see U/S report.Marland KitchenJohny Chess

## 2020-12-23 NOTE — Telephone Encounter (Signed)
Patient is requesting a call back in regards to his recent ultra sound. He can be reached at [phone]. Please advise

## 2020-12-24 NOTE — Telephone Encounter (Signed)
°  Please return call to patient with results °

## 2020-12-24 NOTE — Telephone Encounter (Signed)
Please call patient at [phone]

## 2020-12-24 NOTE — Telephone Encounter (Signed)
Pt has been notified concerning results.. see report.Marland KitchenJohny [name]

## 2020-12-30 ENCOUNTER — Ambulatory Visit (INDEPENDENT_AMBULATORY_CARE_PROVIDER_SITE_OTHER): Payer: Medicare Other

## 2020-12-30 ENCOUNTER — Other Ambulatory Visit: Payer: Self-pay

## 2020-12-30 VITALS — BP 120/70 | HR 66 | Temp 98.3°F | Ht 72.0 in | Wt 253.0 lb

## 2020-12-30 DIAGNOSIS — Z0000 Encounter for general adult medical examination without abnormal findings: Secondary | ICD-10-CM

## 2020-12-30 NOTE — Progress Notes (Addendum)
Subjective:   Jeffrey [name] is a 70 y.o. male who presents for Medicare Annual/Subsequent preventive examination.  Review of Systems    No ROS. Medicare Wellness Visit. Additional risk factors are reflected in social history. Cardiac Risk Factors include: advanced age (>83men, >73 women);dyslipidemia;hypertension;male gender;obesity (BMI >30kg/m2);family history of premature cardiovascular disease     Objective:    Today's Vitals   12/30/20 1326  BP: 120/70  Pulse: 66  Temp: 98.3 F (36.8 C)  SpO2: 94%  Weight: 253 lb (114.8 kg)  Height: 6' (1.829 m)  PainSc: 0-No pain   Body mass index is 34.31 kg/m.  Advanced Directives 12/30/2020 04/14/2018 02/15/2018 03/10/2017 08/13/2015 12/01/2014  Does Patient Have a Medical Advance Directive? Yes Yes Yes No No;Yes No  Type of Paramedic of [place];Living will [place];Living will - - Living will -  Does patient want to make changes to medical advance directive? No - Patient declined - No - Patient declined - - -  Copy of [place] in Chart? No - copy requested No - copy requested - - - -  Would patient like information on creating a medical advance directive? - - - Yes (ED - Information included in AVS) - No - patient declined information    Current Medications (verified) Outpatient Encounter Medications as of 12/30/2020  Medication Sig   Ascorbic Acid (VITAMIN C) 100 MG tablet Take 1 tablet by mouth daily.   aspirin 81 MG tablet Take 81 mg by mouth daily.   celecoxib (CELEBREX) 100 MG capsule Take 1 capsule (100 mg total) by mouth daily.   Cholecalciferol (VITAMIN D3) 2000 units TABS Take 1 tablet by mouth.   finasteride (PROSCAR) 5 MG tablet Take 1 tablet (5 mg total) by mouth daily. Overdue for yearly physical w/labs must see MF for refills   omeprazole (PRILOSEC) 20 MG capsule Take 20 mg by mouth daily.   tamsulosin (FLOMAX) 0.4 MG CAPS capsule TAKE 1 CAPSULE(0.4 MG)  BY MOUTH DAILY   telmisartan (MICARDIS) 80 MG tablet TAKE 1 TABLET(80 MG) BY MOUTH DAILY   triamterene-hydrochlorothiazide (MAXZIDE-25) 37.5-25 MG tablet TAKE 1 TABLET BY MOUTH DAILY   [DISCONTINUED] sildenafil (REVATIO) 20 MG tablet Take 1-5 tablets (20-100 mg total) by mouth daily as needed. For ED   No facility-administered encounter medications on file as of 12/30/2020.    Allergies (verified) Bupropion hcl, Lovastatin, Statins, and Venlafaxine   History: Past Medical History:  Diagnosis Date   Anxiety    Arthritis    knees   Blood transfusion without reported diagnosis    BPH (benign prostatic hypertrophy)    Cataract    "beginnings of"   Chronic kidney disease    kidney infection   Depression    GERD (gastroesophageal reflux disease)    Hypertension    Meralgia paresthetica of right side    SCCA (squamous cell carcinoma) of skin 11/05/2020   Left Thigh-Anterior (in situ) (curet and 5FU)   Past Surgical History:  Procedure Laterality Date   HERNIA REPAIR     inguinal, umbilical   SPLENECTOMY     Family History  Problem Relation Age of Onset   Heart disease Mother 61       chf   Hypertension Other    Colon cancer Neg Hx    Esophageal cancer Neg Hx    Stomach cancer Neg Hx    Rectal cancer Neg Hx    Social History   Socioeconomic  History   Marital status: Married    Spouse name: Not on file   Number of children: 2   Years of education: Not on file   Highest education level: Not on file  Occupational History   Not on file  Tobacco Use   Smoking status: Never Smoker   Smokeless tobacco: Never Used  Vaping Use   Vaping Use: Never used  Substance and Sexual Activity   Alcohol use: Yes    Alcohol/week: 0.0 standard drinks    Comment: occasional wine   Drug use: No   Sexual activity: Yes  Other Topics Concern   Not on file  Social History Narrative   Regular exercise-No   Social Determinants of Health   Financial Resource Strain: Low Risk     Difficulty of Paying Living Expenses: Not hard at all  Food Insecurity: No Food Insecurity   Worried About Charity fundraiser in the Last Year: Never true   [place] in the Last Year: Never true  Transportation Needs: No Transportation Needs   Lack of Transportation (Medical): No   Lack of Transportation (Non-Medical): No  Physical Activity: Sufficiently Active   Days of Exercise per Week: 5 days   Minutes of Exercise per Session: 30 min  Stress: No Stress Concern Present   Feeling of Stress : Not at all  Social Connections: Socially Integrated   Frequency of Communication with Friends and Family: More than three times a week   Frequency of Social Gatherings with Friends and Family: More than three times a week   Attends Religious Services: More than 4 times per year   Active Member of [employer] or Organizations: Yes   Attends Music therapist: More than 4 times per year   Marital Status: Married    Tobacco Counseling Counseling given: Not Answered   Clinical Intake:  Pre-visit preparation completed: Yes  Pain : No/denies pain Pain Score: 0-No pain     BMI - recorded: 34.31 Nutritional Status: BMI > 30  Obese Nutritional Risks: None Diabetes: No  How often do you need to have someone help you when you read instructions, pamphlets, or other written materials from your doctor or pharmacy?: 1 - Never What is the last grade level you completed in school?: HIGH SCHOOL GRADUATE  Diabetic? NO  Interpreter Needed?: No  Information entered by :: SHENIKA HATFIELD, LPN.   Activities of Daily Living In your present state of health, do you have any difficulty performing the following activities: 12/30/2020 11/08/2020  Hearing? N Y  Comment - hearing loss in left ear  Vision? N N  Difficulty concentrating or making decisions? N N  Walking or climbing stairs? N N  Dressing or bathing? N N  Doing errands, shopping? N N  Preparing Food and eating ? N -  Using  the Toilet? N -  In the past six months, have you accidently leaked urine? N -  Do you have problems with loss of bowel control? N -  Managing your Medications? N -  Managing your Finances? N -  Housekeeping or managing your Housekeeping? N -  Some recent data might be hidden    Patient Care Team: Plotnikov, Evie Lacks, MD as PCP - General Franchot Gallo, MD as Attending Physician (Urology) Lavonna Monarch, MD as Consulting Physician (Dermatology)  Indicate any recent Medical Services you may have received from other than [facility] providers in the past year (date may be approximate).     Assessment:  This is a routine wellness examination for Nishan.  Hearing/Vision screen No exam data present  Dietary issues and exercise activities discussed: Current Exercise Habits: Home exercise routine, Type of exercise: walking (STATIONARY BIKE), Time (Minutes): 30, Frequency (Times/Week): 5, Weekly Exercise (Minutes/Week): 150, Intensity: Moderate, Exercise limited by: None identified  Goals      lose weigh      Continue to exercise and eat healthy, enjoy life, family, and church     Patient Stated     I want to lose weight by monitoring how much carbohydrates I eat, starting to go to the gym and exercise.        Depression Screen PHQ 2/9 Scores 12/30/2020 11/08/2020 04/14/2018 03/10/2017 03/10/2017 12/06/2015  PHQ - 2 Score 0 0 0 0 0 0  PHQ- 9 Score - 0 0 0 - -    Fall Risk Fall Risk  12/30/2020 11/08/2020 09/20/2019 05/25/2019 04/14/2018  Falls in the past year? 0 0 0 (No Data) Yes  Comment - - Emmi Telephone Survey: data to providers prior to load Emmi Telephone Survey: data to providers prior to load -  Number falls in past yr: 0 0 - (No Data) 1  Comment - - - Emmi Telephone Survey Actual Response =  -  Injury with Fall? 0 0 - - -  Risk for fall due to : No Fall Risks No Fall Risks - - -  Follow up Falls evaluation completed Falls evaluation completed - - -    FALL RISK PREVENTION  PERTAINING TO THE HOME:  Any stairs in or around the home? Yes  If so, are there any without handrails? No  Home free of loose throw rugs in walkways, pet beds, electrical cords, etc? Yes  Adequate lighting in your home to reduce risk of falls? Yes   ASSISTIVE DEVICES UTILIZED TO PREVENT FALLS:  Life alert? No  Use of a cane, walker or w/c? No  Grab bars in the bathroom? Yes  Shower chair or bench in shower? No  Elevated toilet seat or a handicapped toilet? Yes   TIMED UP AND GO:  Was the test performed? No .  Length of time to ambulate 10 feet: 0 sec.   Gait steady and fast without use of assistive device  Cognitive Function: Normal cognitive status assessed by direct observation by this Nurse Health Advisor. No abnormalities found.          Immunizations Immunization History  Administered Date(s) Administered   Fluad Quad(high Dose 65+) 07/20/2019, 09/14/2020   Influenza Whole 10/16/2002, 09/05/2012   Influenza, High Dose Seasonal PF 09/08/2017, 10/12/2018   Influenza,inj,Quad PF,6+ Mos 09/04/2016   Influenza-Unspecified 09/05/2013, 09/16/2015   Meningococcal Polysaccharide 05/23/2010   PFIZER(Purple Top)SARS-COV-2 Vaccination 01/04/2020, 01/25/2020   Pneumococcal Conjugate-13 12/06/2015   Pneumococcal Polysaccharide-23 05/23/2010, 04/14/2018   Tdap 12/06/2013    TDAP status: Up to date  Flu Vaccine status: Up to date  Pneumococcal vaccine status: Up to date  Covid-19 vaccine status: Completed vaccines  Qualifies for Shingles Vaccine? Yes   Zostavax completed No   Shingrix Completed?: No.    Education has been provided regarding the importance of this vaccine. Patient has been advised to call insurance company to determine out of pocket expense if they have not yet received this vaccine. Advised may also receive vaccine at local pharmacy or Health Dept. Verbalized acceptance and understanding.  Screening Tests Health Maintenance  Topic Date Due   COVID-19  Vaccine (3 - Pfizer risk 4-dose series) 02/22/2020  TETANUS/TDAP  12/07/2023   COLONOSCOPY (Pts 45-54yrs Insurance coverage will need to be confirmed)  08/26/2025   INFLUENZA VACCINE  Completed   Hepatitis C Screening  Completed   PNA vac Low Risk Adult  Completed   HPV VACCINES  Aged Out    Health Maintenance  Health Maintenance Due  Topic Date Due   COVID-19 Vaccine (3 - Pfizer risk 4-dose series) 02/22/2020    Colorectal cancer screening: Type of screening: Colonoscopy. Completed 08/27/2015. Repeat every 10 years  Lung Cancer Screening: (Low Dose CT Chest recommended if Age 18-80 years, 30 pack-year currently smoking OR have quit w/in 15years.) does not qualify.   Lung Cancer Screening Referral: NO  Additional Screening:  Hepatitis C Screening: does qualify; Completed YES  Vision Screening: Recommended annual ophthalmology exams for early detection of glaucoma and other disorders of the eye. Is the patient up to date with their annual eye exam?  Yes  Who is the provider or what is the name of the office in which the patient attends annual eye exams? [facility] If pt is not established with a provider, would they like to be referred to a provider to establish care? No .   Dental Screening: Recommended annual dental exams for proper oral hygiene  Community Resource Referral / Chronic Care Management: CRR required this visit?  No   CCM required this visit?  No      Plan:     I have personally reviewed and noted the following in the patient's chart:   Medical and social history Use of alcohol, tobacco or illicit drugs  Current medications and supplements Functional ability and status Nutritional status Physical activity Advanced directives List of other physicians Hospitalizations, surgeries, and ER visits in previous 12 months Vitals Screenings to include cognitive, depression, and falls Referrals and appointments  In addition, I have reviewed and discussed  with patient certain preventive protocols, quality metrics, and best practice recommendations. A written personalized care plan for preventive services as well as general preventive health recommendations were provided to patient.     Sheral Flow, LPN   01/24/9378   Nurse Notes:  Medications reviewed with patient; no opioid use noted.  Medical screening examination/treatment/procedure(s) were performed by non-physician practitioner and as supervising physician I was immediately available for consultation/collaboration.  I agree with above. Lew Dawes, MD

## 2020-12-30 NOTE — Patient Instructions (Signed)
[name] , Thank you for taking time to come for your Medicare Wellness Visit. I appreciate your ongoing commitment to your health goals. Please review the following plan we discussed and let me know if I can assist you in the future.   Screening recommendations/referrals: Colonoscopy: 08/27/2015; due every 10 years Recommended yearly ophthalmology/optometry visit for glaucoma screening and checkup Recommended yearly dental visit for hygiene and checkup  Vaccinations: Influenza vaccine: 09/14/2020 Pneumococcal vaccine: 12/06/2015, 04/14/2018 Tdap vaccine: 12/06/2013; due every 10 years Shingles vaccine: never done; can check with local pharmacy   Covid-19: 01/04/2020, 01/25/2020  Advanced directives: Please bring a copy of your health care power of attorney and living will to the office at your convenience.  Conditions/risks identified: Yes; Reviewed health maintenance screenings with patient today and relevant education, vaccines, and/or referrals were provided. Please continue to do your personal lifestyle choices by: daily care of teeth and gums, regular physical activity (goal should be 5 days a week for 30 minutes), eat a healthy diet, avoid tobacco and drug use, limiting any alcohol intake, taking a low-dose aspirin (if not allergic or have been advised by your provider otherwise) and taking vitamins and minerals as recommended by your provider. Continue doing brain stimulating activities (puzzles, reading, adult coloring books, staying active) to keep memory sharp. Continue to eat heart healthy diet (full of fruits, vegetables, whole grains, lean protein, water--limit salt, fat, and sugar intake) and increase physical activity as tolerated.  Next appointment: Please schedule your next Medicare Wellness Visit with your Nurse Health Advisor in 1 year by calling [phone].  Preventive Care 39 Years and Older, Male Preventive care refers to lifestyle choices and visits with your health care  provider that can promote health and wellness. What does preventive care include?  A yearly physical exam. This is also called an annual well check.  Dental exams once or twice a year.  Routine eye exams. Ask your health care provider how often you should have your eyes checked.  Personal lifestyle choices, including:  Daily care of your teeth and gums.  Regular physical activity.  Eating a healthy diet.  Avoiding tobacco and drug use.  Limiting alcohol use.  Practicing safe sex.  Taking low doses of aspirin every day.  Taking vitamin and mineral supplements as recommended by your health care provider. What happens during an annual well check? The services and screenings done by your health care provider during your annual well check will depend on your age, overall health, lifestyle risk factors, and family history of disease. Counseling  Your health care provider may ask you questions about your:  Alcohol use.  Tobacco use.  Drug use.  Emotional well-being.  Home and relationship well-being.  Sexual activity.  Eating habits.  History of falls.  Memory and ability to understand (cognition).  Work and work Statistician. Screening  You may have the following tests or measurements:  Height, weight, and BMI.  Blood pressure.  Lipid and cholesterol levels. These may be checked every 5 years, or more frequently if you are over 74 years old.  Skin check.  Lung cancer screening. You may have this screening every year starting at age 64 if you have a 30-pack-year history of smoking and currently smoke or have quit within the past 15 years.  Fecal occult blood test (FOBT) of the stool. You may have this test every year starting at age 76.  Flexible sigmoidoscopy or colonoscopy. You may have a sigmoidoscopy every 5 years or a colonoscopy  every 10 years starting at age 36.  Prostate cancer screening. Recommendations will vary depending on your family history and  other risks.  Hepatitis C blood test.  Hepatitis B blood test.  Sexually transmitted disease (STD) testing.  Diabetes screening. This is done by checking your blood sugar (glucose) after you have not eaten for a while (fasting). You may have this done every 1-3 years.  Abdominal aortic aneurysm (AAA) screening. You may need this if you are a current or former smoker.  Osteoporosis. You may be screened starting at age 33 if you are at high risk. Talk with your health care provider about your test results, treatment options, and if necessary, the need for more tests. Vaccines  Your health care provider may recommend certain vaccines, such as:  Influenza vaccine. This is recommended every year.  Tetanus, diphtheria, and acellular pertussis (Tdap, Td) vaccine. You may need a Td booster every 10 years.  Zoster vaccine. You may need this after age 24.  Pneumococcal 13-valent conjugate (PCV13) vaccine. One dose is recommended after age 62.  Pneumococcal polysaccharide (PPSV23) vaccine. One dose is recommended after age 45. Talk to your health care provider about which screenings and vaccines you need and how often you need them. This information is not intended to replace advice given to you by your health care provider. Make sure you discuss any questions you have with your health care provider. Document Released: 11/08/2015 Document Revised: 07/01/2016 Document Reviewed: 08/13/2015 Elsevier Interactive Patient Education  2017 [place] Prevention in the Home Falls can cause injuries. They can happen to people of all ages. There are many things you can do to make your home safe and to help prevent falls. What can I do on the outside of my home?  Regularly fix the edges of walkways and driveways and fix any cracks.  Remove anything that might make you trip as you walk through a door, such as a raised step or threshold.  Trim any bushes or trees on the path to your  home.  Use bright outdoor lighting.  Clear any walking paths of anything that might make someone trip, such as rocks or tools.  Regularly check to see if handrails are loose or broken. Make sure that both sides of any steps have handrails.  Any raised decks and porches should have guardrails on the edges.  Have any leaves, snow, or ice cleared regularly.  Use sand or salt on walking paths during winter.  Clean up any spills in your garage right away. This includes oil or grease spills. What can I do in the bathroom?  Use night lights.  Install grab bars by the toilet and in the tub and shower. Do not use towel bars as grab bars.  Use non-skid mats or decals in the tub or shower.  If you need to sit down in the shower, use a plastic, non-slip stool.  Keep the floor dry. Clean up any water that spills on the floor as soon as it happens.  Remove soap buildup in the tub or shower regularly.  Attach bath mats securely with double-sided non-slip rug tape.  Do not have throw rugs and other things on the floor that can make you trip. What can I do in the bedroom?  Use night lights.  Make sure that you have a light by your bed that is easy to reach.  Do not use any sheets or blankets that are too big for your bed. They should  not hang down onto the floor.  Have a firm chair that has side arms. You can use this for support while you get dressed.  Do not have throw rugs and other things on the floor that can make you trip. What can I do in the kitchen?  Clean up any spills right away.  Avoid walking on wet floors.  Keep items that you use a lot in easy-to-reach places.  If you need to reach something above you, use a strong step stool that has a grab bar.  Keep electrical cords out of the way.  Do not use floor polish or wax that makes floors slippery. If you must use wax, use non-skid floor wax.  Do not have throw rugs and other things on the floor that can make you  trip. What can I do with my stairs?  Do not leave any items on the stairs.  Make sure that there are handrails on both sides of the stairs and use them. Fix handrails that are broken or loose. Make sure that handrails are as long as the stairways.  Check any carpeting to make sure that it is firmly attached to the stairs. Fix any carpet that is loose or worn.  Avoid having throw rugs at the top or bottom of the stairs. If you do have throw rugs, attach them to the floor with carpet tape.  Make sure that you have a light switch at the top of the stairs and the bottom of the stairs. If you do not have them, ask someone to add them for you. What else can I do to help prevent falls?  Wear shoes that:  Do not have high heels.  Have rubber bottoms.  Are comfortable and fit you well.  Are closed at the toe. Do not wear sandals.  If you use a stepladder:  Make sure that it is fully opened. Do not climb a closed stepladder.  Make sure that both sides of the stepladder are locked into place.  Ask someone to hold it for you, if possible.  Clearly mark and make sure that you can see:  Any grab bars or handrails.  First and last steps.  Where the edge of each step is.  Use tools that help you move around (mobility aids) if they are needed. These include:  Canes.  Walkers.  Scooters.  Crutches.  Turn on the lights when you go into a dark area. Replace any light bulbs as soon as they burn out.  Set up your furniture so you have a clear path. Avoid moving your furniture around.  If any of your floors are uneven, fix them.  If there are any pets around you, be aware of where they are.  Review your medicines with your doctor. Some medicines can make you feel dizzy. This can increase your chance of falling. Ask your doctor what other things that you can do to help prevent falls. This information is not intended to replace advice given to you by your health care provider. Make  sure you discuss any questions you have with your health care provider. Document Released: 08/08/2009 Document Revised: 03/19/2016 Document Reviewed: 11/16/2014 Elsevier Interactive Patient Education  2017 [employer].

## 2021-01-03 ENCOUNTER — Ambulatory Visit
Admission: RE | Admit: 2021-01-03 | Discharge: 2021-01-03 | Disposition: A | Discharge status: Home / Self Care | Payer: Medicare Other | Source: Ambulatory Visit | Attending: Internal Medicine | Admitting: Internal Medicine

## 2021-01-03 DIAGNOSIS — E785 Hyperlipidemia, unspecified: Secondary | ICD-10-CM

## 2021-01-12 ENCOUNTER — Other Ambulatory Visit: Payer: Self-pay

## 2021-01-12 ENCOUNTER — Ambulatory Visit
Admission: RE | Admit: 2021-01-12 | Discharge: 2021-01-12 | Disposition: A | Discharge status: Home / Self Care | Payer: Medicare Other | Source: Ambulatory Visit | Attending: Internal Medicine | Admitting: Internal Medicine

## 2021-01-12 DIAGNOSIS — Z9081 Acquired absence of spleen: Secondary | ICD-10-CM | POA: Diagnosis not present

## 2021-01-12 DIAGNOSIS — N281 Cyst of kidney, acquired: Secondary | ICD-10-CM

## 2021-01-12 DIAGNOSIS — K8020 Calculus of gallbladder without cholecystitis without obstruction: Secondary | ICD-10-CM | POA: Diagnosis not present

## 2021-01-12 MED ORDER — GADOBENATE DIMEGLUMINE 529 MG/ML IV SOLN
20.0000 mL | Freq: Once | INTRAVENOUS | Status: AC | PRN
  Administered 2021-01-12: 20 mL via INTRAVENOUS

## 2021-01-13 ENCOUNTER — Other Ambulatory Visit: Payer: Self-pay | Admitting: Internal Medicine

## 2021-01-13 DIAGNOSIS — I2510 Atherosclerotic heart disease of native coronary artery without angina pectoris: Secondary | ICD-10-CM | POA: Insufficient documentation

## 2021-01-13 DIAGNOSIS — I700 Atherosclerosis of aorta: Secondary | ICD-10-CM

## 2021-01-13 DIAGNOSIS — I2583 Coronary atherosclerosis due to lipid rich plaque: Secondary | ICD-10-CM

## 2021-02-03 ENCOUNTER — Other Ambulatory Visit: Payer: Self-pay | Admitting: Internal Medicine

## 2021-02-25 ENCOUNTER — Ambulatory Visit: Payer: Self-pay

## 2021-02-25 ENCOUNTER — Ambulatory Visit (INDEPENDENT_AMBULATORY_CARE_PROVIDER_SITE_OTHER): Payer: Medicare Other

## 2021-02-25 ENCOUNTER — Encounter: Payer: Self-pay | Admitting: Family Medicine

## 2021-02-25 ENCOUNTER — Other Ambulatory Visit: Payer: Self-pay

## 2021-02-25 ENCOUNTER — Ambulatory Visit (INDEPENDENT_AMBULATORY_CARE_PROVIDER_SITE_OTHER): Payer: Medicare Other | Admitting: Family Medicine

## 2021-02-25 VITALS — BP 140/84 | HR 63 | Ht 72.0 in | Wt 255.0 lb

## 2021-02-25 DIAGNOSIS — I2583 Coronary atherosclerosis due to lipid rich plaque: Secondary | ICD-10-CM

## 2021-02-25 DIAGNOSIS — G8929 Other chronic pain: Secondary | ICD-10-CM

## 2021-02-25 DIAGNOSIS — M25561 Pain in right knee: Secondary | ICD-10-CM

## 2021-02-25 DIAGNOSIS — M25562 Pain in left knee: Secondary | ICD-10-CM | POA: Diagnosis not present

## 2021-02-25 DIAGNOSIS — M179 Osteoarthritis of knee, unspecified: Secondary | ICD-10-CM | POA: Insufficient documentation

## 2021-02-25 DIAGNOSIS — M170 Bilateral primary osteoarthritis of knee: Secondary | ICD-10-CM

## 2021-02-25 DIAGNOSIS — I2510 Atherosclerotic heart disease of native coronary artery without angina pectoris: Secondary | ICD-10-CM

## 2021-02-25 DIAGNOSIS — M1710 Unilateral primary osteoarthritis, unspecified knee: Secondary | ICD-10-CM | POA: Insufficient documentation

## 2021-02-25 NOTE — Progress Notes (Signed)
[facility] [address] [place] Phone: [phone] Subjective:   I Jeffrey [name] am serving as a Education administrator for Dr. Hulan Saas.  This visit occurred during the SARS-CoV-2 public health emergency.  Safety protocols were in place, including screening questions prior to the visit, additional usage of staff PPE, and extensive cleaning of exam room while observing appropriate contact time as indicated for disinfecting solutions.   I'm seeing this patient by the request  of:  Plotnikov, Evie Lacks, MD  CC: Bilateral knee pain  NAT:[id number]  Jeffrey [name] is a 70 y.o. male coming in with complaint of bilateral knee pain. Right knee is worse. History of torn mensicus in the right knee. Pain causes a limp. States the knees feel as if they are going to give way. Has to take small steps in the morning.  Onset- Chronic Location - medial Duration- worse in the mornings when he first gets up and depends on how much he walks  Character- dull  Aggravating factors- walking, stairs  Reliving factors- Therapies tried- oral medications, ice packs  Severity-6-7 /10 at its worse      Past Medical History:  Diagnosis Date  . Anxiety   . Arthritis    knees  . Blood transfusion without reported diagnosis   . BPH (benign prostatic hypertrophy)   . Cataract    "beginnings of"  . Chronic kidney disease    kidney infection  . Depression   . GERD (gastroesophageal reflux disease)   . Hypertension   . Meralgia paresthetica of right side   . SCCA (squamous cell carcinoma) of skin 11/05/2020   Left Thigh-Anterior (in situ) (curet and 5FU)   Past Surgical History:  Procedure Laterality Date  . HERNIA REPAIR     inguinal, umbilical  . SPLENECTOMY     Social History   Socioeconomic History  . Marital status: Married    Spouse name: Not on file  . Number of children: 2  . Years of education: Not on file  . Highest education level: Not on file   Occupational History  . Not on file  Tobacco Use  . Smoking status: Never Smoker  . Smokeless tobacco: Never Used  Vaping Use  . Vaping Use: Never used  Substance and Sexual Activity  . Alcohol use: Yes    Alcohol/week: 0.0 standard drinks    Comment: occasional wine  . Drug use: No  . Sexual activity: Yes  Other Topics Concern  . Not on file  Social History Narrative   Regular exercise-No   Social Determinants of Health   Financial Resource Strain: Low Risk   . Difficulty of Paying Living Expenses: Not hard at all  Food Insecurity: No Food Insecurity  . Worried About Charity fundraiser in the Last Year: Never true  . Ran Out of Food in the Last Year: Never true  Transportation Needs: No Transportation Needs  . Lack of Transportation (Medical): No  . Lack of Transportation (Non-Medical): No  Physical Activity: Sufficiently Active  . Days of Exercise per Week: 5 days  . Minutes of Exercise per Session: 30 min  Stress: No Stress Concern Present  . Feeling of Stress : Not at all  Social Connections: Socially Integrated  . Frequency of Communication with Friends and Family: More than three times a week  . Frequency of Social Gatherings with Friends and Family: More than three times a week  . Attends Religious Services: More than  4 times per year  . Active Member of Clubs or Organizations: Yes  . Attends Archivist Meetings: More than 4 times per year  . Marital Status: Married   Allergies  Allergen Reactions  . Bupropion Hcl     Unsure of reaction  . Lovastatin     REACTION: myalgia  . Statins     weak  . Venlafaxine     Unsure of reaction   Family History  Problem Relation Age of Onset  . Heart disease Mother 42       chf  . Hypertension Other   . Colon cancer Neg Hx   . Esophageal cancer Neg Hx   . Stomach cancer Neg Hx   . Rectal cancer Neg Hx      Current Outpatient Medications (Cardiovascular):  .  telmisartan (MICARDIS) 80 MG tablet,  TAKE 1 TABLET(80 MG) BY MOUTH DAILY .  triamterene-hydrochlorothiazide (MAXZIDE-25) 37.5-25 MG tablet, TAKE 1 TABLET BY MOUTH DAILY   Current Outpatient Medications (Analgesics):  .  aspirin 81 MG tablet, Take 81 mg by mouth daily. .  celecoxib (CELEBREX) 100 MG capsule, TAKE 1 CAPSULE(100 MG) BY MOUTH DAILY   Current Outpatient Medications (Other):  Marland Kitchen  Ascorbic Acid (VITAMIN C) 100 MG tablet, Take 1 tablet by mouth daily. .  Cholecalciferol (VITAMIN D3) 2000 units TABS, Take 1 tablet by mouth. .  finasteride (PROSCAR) 5 MG tablet, Take 1 tablet (5 mg total) by mouth daily. Overdue for yearly physical w/labs must see MF for refills .  omeprazole (PRILOSEC) 20 MG capsule, Take 20 mg by mouth daily. .  tamsulosin (FLOMAX) 0.4 MG CAPS capsule, TAKE 1 CAPSULE(0.4 MG) BY MOUTH DAILY   Reviewed prior external information including notes and imaging from  primary care provider As well as notes that were available from care everywhere and other healthcare systems.  Past medical history, social, surgical and family history all reviewed in electronic medical record.  No pertanent information unless stated regarding to the chief complaint.   Review of Systems:  No headache, visual changes, nausea, vomiting, diarrhea, constipation, dizziness, abdominal pain, skin rash, fevers, chills, night sweats, weight loss, swollen lymph nodes, body aches, joint swelling, chest pain, shortness of breath, mood changes. POSITIVE muscle aches  Objective  Blood pressure 140/84, pulse 63, height 6' (1.829 m), weight 255 lb (115.7 kg), SpO2 96 %.   General: No apparent distress alert and oriented x3 mood and affect normal, dressed appropriately.  Overweight HEENT: Pupils equal, extraocular movements intact  Respiratory: Patient's speak in full sentences and does not appear short of breath  Cardiovascular: Trace lower extremity edema, non tender, no erythema  Gait antalgic gait Knee: bilateral  valgus deformity  noted. Large thigh to calf ratio.  Tender to palpation over medial and PF joint line.  ROM full in flexion and extension and lower leg rotation. instability with valgus force.  painful patellar compression. Patellar glide with moderate crepitus. Patellar and quadriceps tendons unremarkable. Hamstring and quadriceps strength is normal.  Limited musculoskeletal ultrasound was performed and interpreted by Lyndal Pulley  \Limited ultrasound of patient's knees bilaterally show the patient does have moderate to severe narrowing of the medial compartment.  No effusion noted of the patellofemoral joint, medial meniscus   After informed verbal consent, patient was seated on exam table. Right knee was prepped with alcohol swab and utilizing anterolateral approach, patient's right knee space was injected with 4:1  marcaine 0.5%: Kenalog 40mg /dL. Patient tolerated the procedure well without  immediate complications.  After informed verbal consent, patient was seated on exam table. Left knee was prepped with alcohol swab and utilizing anterolateral approach, patient's left knee space was injected with 4:1  marcaine 0.5%: Kenalog 40mg /dL. Patient tolerated the procedure well without immediate complications.   Impression and Recommendations:     The above documentation has been reviewed and is accurate and complete Lyndal Pulley, DO

## 2021-02-25 NOTE — Assessment & Plan Note (Signed)
Bilateral injections given today, discussed topical anti-inflammatories, icing regimen, home exercises, encourage weight loss, increase activity slowly.  Follow-up again with me in 6 to 8 weeks.  Patient could be a candidate for viscosupplementation

## 2021-02-25 NOTE — Patient Instructions (Addendum)
Good to see you Xray today Injections today Ice 20 mins 2 times a day Knee exercises Will get you approved for gel Pennsaid topically 2 times a day See me again in 6-8 weeks

## 2021-04-08 NOTE — Progress Notes (Deleted)
CARDIOLOGY CONSULT NOTE       Patient ID: Jeffrey [name] MRN: [id number] DOB/AGE: 1951-01-14 70 y.o.  Referring Physician: Plotnicov Primary Physician: Cassandria Anger, MD Primary Cardiologist: New Reason for Consultation: CAD  Active Problems:   * No active hospital problems. *   HPI:  70 y.o. referred by Dr Laurian Brim for CAD Calcium screening CT 01/03/21 showed score of 306 which was 45 th percentile for age/sex he has a history of HTN, No clinical history of chest pain, arrhythmia, valve disease, CHF or other vascular disease ECG in 2013 with SR normal ICRBBB Last LDL in Epic 2020 was 114 not on RX Activity limited by bilateral knee pain Has seen rehab Dr [name] for this had bilateral injections 02/25/21    ***  ROS All other systems reviewed and negative except as noted above  Past Medical History:  Diagnosis Date   Anxiety    Arthritis    knees   Blood transfusion without reported diagnosis    BPH (benign prostatic hypertrophy)    Cataract    "beginnings of"   Chronic kidney disease    kidney infection   Depression    GERD (gastroesophageal reflux disease)    Hypertension    Meralgia paresthetica of right side    SCCA (squamous cell carcinoma) of skin 11/05/2020   Left Thigh-Anterior (in situ) (curet and 5FU)    Family History  Problem Relation Age of Onset   Heart disease Mother 68       chf   Hypertension Other    Colon cancer Neg Hx    Esophageal cancer Neg Hx    Stomach cancer Neg Hx    Rectal cancer Neg Hx     Social History   Socioeconomic History   Marital status: Married    Spouse name: Not on file   Number of children: 2   Years of education: Not on file   Highest education level: Not on file  Occupational History   Not on file  Tobacco Use   Smoking status: Never   Smokeless tobacco: Never  Vaping Use   Vaping Use: Never used  Substance and Sexual Activity   Alcohol use: Yes    Alcohol/week: 0.0 standard drinks    Comment:  occasional wine   Drug use: No   Sexual activity: Yes  Other Topics Concern   Not on file  Social History Narrative   Regular exercise-No   Social Determinants of Health   Financial Resource Strain: Low Risk    Difficulty of Paying Living Expenses: Not hard at all  Food Insecurity: No Food Insecurity   Worried About Charity fundraiser in the Last Year: Never true   [place] in the Last Year: Never true  Transportation Needs: No Transportation Needs   Lack of Transportation (Medical): No   Lack of Transportation (Non-Medical): No  Physical Activity: Sufficiently Active   Days of Exercise per Week: 5 days   Minutes of Exercise per Session: 30 min  Stress: No Stress Concern Present   Feeling of Stress : Not at all  Social Connections: Socially Integrated   Frequency of Communication with Friends and Family: More than three times a week   Frequency of Social Gatherings with Friends and Family: More than three times a week   Attends Religious Services: More than 4 times per year   Active Member of [employer] or Organizations: Yes   Attends Archivist Meetings: More than  4 times per year   Marital Status: Married  Human resources officer Violence: Not on file    Past Surgical History:  Procedure Laterality Date   HERNIA REPAIR     inguinal, umbilical   SPLENECTOMY        Current Outpatient Medications:    Ascorbic Acid (VITAMIN C) 100 MG tablet, Take 1 tablet by mouth daily., Disp: , Rfl:    aspirin 81 MG tablet, Take 81 mg by mouth daily., Disp: , Rfl:    celecoxib (CELEBREX) 100 MG capsule, TAKE 1 CAPSULE(100 MG) BY MOUTH DAILY, Disp: 90 capsule, Rfl: 1   Cholecalciferol (VITAMIN D3) 2000 units TABS, Take 1 tablet by mouth., Disp: , Rfl:    finasteride (PROSCAR) 5 MG tablet, Take 1 tablet (5 mg total) by mouth daily. Overdue for yearly physical w/labs must see MF for refills, Disp: 90 tablet, Rfl: 3   omeprazole (PRILOSEC) 20 MG capsule, Take 20 mg by mouth daily.,  Disp: , Rfl:    tamsulosin (FLOMAX) 0.4 MG CAPS capsule, TAKE 1 CAPSULE(0.4 MG) BY MOUTH DAILY, Disp: 90 capsule, Rfl: 3   telmisartan (MICARDIS) 80 MG tablet, TAKE 1 TABLET(80 MG) BY MOUTH DAILY, Disp: 90 tablet, Rfl: 3   triamterene-hydrochlorothiazide (MAXZIDE-25) 37.5-25 MG tablet, TAKE 1 TABLET BY MOUTH DAILY, Disp: 90 tablet, Rfl: 3    Physical Exam: There were no vitals taken for this visit.    Affect appropriate Healthy:  appears stated age 89: normal Neck supple with no adenopathy JVP normal no bruits no thyromegaly Lungs clear with no wheezing and good diaphragmatic motion Heart:  S1/S2 no murmur, no rub, gallop or click PMI normal Abdomen: benighn, BS positve, no tenderness, no AAA no bruit.  No HSM or HJR Distal pulses intact with no bruits No edema Neuro non-focal Skin warm and dry No muscular weakness   Labs:   Lab Results  Component Value Date   WBC 8.7 11/08/2020   HGB 15.1 11/08/2020   HCT 45.4 11/08/2020   MCV 96.8 11/08/2020   PLT 352.0 11/08/2020   No results for input(s): NA, K, CL, CO2, BUN, CREATININE, CALCIUM, PROT, BILITOT, ALKPHOS, ALT, AST, GLUCOSE in the last 168 hours.  Invalid input(s): LABALBU No results found for: CKTOTAL, CKMB, CKMBINDEX, TROPONINI  Lab Results  Component Value Date   CHOL 173 07/20/2019   CHOL 183 04/14/2018   CHOL 191 12/09/2015   Lab Results  Component Value Date   HDL 46.80 07/20/2019   HDL 55.50 04/14/2018   HDL 49.20 12/09/2015   Lab Results  Component Value Date   LDLCALC 114 (H) 07/20/2019   LDLCALC 111 (H) 04/14/2018   LDLCALC 124 (H) 12/09/2015   Lab Results  Component Value Date   TRIG 60.0 07/20/2019   TRIG 83.0 04/14/2018   TRIG 89.0 12/09/2015   Lab Results  Component Value Date   CHOLHDL 4 07/20/2019   CHOLHDL 3 04/14/2018   CHOLHDL 4 12/09/2015   Lab Results  Component Value Date   LDLDIRECT 151.8 05/15/2010   LDLDIRECT 150.3 03/13/2009      Radiology: No results  found.  EKG: ***   ASSESSMENT AND PLAN:   CAD:  sub clinical with above average calcium score for age ASA *** HTN;  Well controlled.  Continue current medications and low sodium Dash type diet.   HLD:  given high calcium score discussed starting low dose crestor 5 mg f/u labs in 3 months  Prostatism:  continue Proscar PSA elevated 4.46 06/2019 needs  f/u with primary  Arthritis:  knees f/u Dr Tamala Julian post steroid injection ? Trial of visco-supplementation if not improved   ***  Signed: Jenkins Rouge 04/08/2021, 12:33 PM

## 2021-04-09 DIAGNOSIS — Z20822 Contact with and (suspected) exposure to covid-19: Secondary | ICD-10-CM | POA: Diagnosis not present

## 2021-04-15 ENCOUNTER — Ambulatory Visit: Payer: Medicare Other | Admitting: Cardiovascular Disease

## 2021-04-16 NOTE — Progress Notes (Signed)
[facility] [place] Phone: [phone] Subjective:   [name], am serving as a scribe for Dr. Hulan Saas. This visit occurred during the SARS-CoV-2 public health emergency.  Safety protocols were in place, including screening questions prior to the visit, additional usage of staff PPE, and extensive cleaning of exam room while observing appropriate contact time as indicated for disinfecting solutions.   I'm seeing this patient by the request  of:  Plotnikov, Evie Lacks, MD  CC: Knee pain follow-up  [id number]  02/25/2021 Bilateral injections given today, discussed topical anti-inflammatories, icing regimen, home exercises, encourage weight loss, increase activity slowly.  Follow-up again with me in 6 to 8 weeks.  Patient could be a candidate for viscosupplementation  Update 04/17/2021 VALIANT DILLS is a 70 y.o. male coming in with complaint of B knee pain. . Orthovisc approved.  Patient does have known arthritic changes of her knees.  Has failed all conservative therapy otherwise.  Patient states that his right knee continues to bother him. Left knee pain has improved.        Past Medical History:  Diagnosis Date   Anxiety    Arthritis    knees   Blood transfusion without reported diagnosis    BPH (benign prostatic hypertrophy)    Cataract    "beginnings of"   Chronic kidney disease    kidney infection   Depression    GERD (gastroesophageal reflux disease)    Hypertension    Meralgia paresthetica of right side    SCCA (squamous cell carcinoma) of skin 11/05/2020   Left Thigh-Anterior (in situ) (curet and 5FU)   Past Surgical History:  Procedure Laterality Date   HERNIA REPAIR     inguinal, umbilical   SPLENECTOMY     Social History   Socioeconomic History   Marital status: Married    Spouse name: Not on file   Number of children: 2   Years of education: Not on file   Highest education level:  Not on file  Occupational History   Not on file  Tobacco Use   Smoking status: Never   Smokeless tobacco: Never  Vaping Use   Vaping Use: Never used  Substance and Sexual Activity   Alcohol use: Yes    Alcohol/week: 0.0 standard drinks    Comment: occasional wine   Drug use: No   Sexual activity: Yes  Other Topics Concern   Not on file  Social History Narrative   Regular exercise-No   Social Determinants of Health   Financial Resource Strain: Low Risk    Difficulty of Paying Living Expenses: Not hard at all  Food Insecurity: No Food Insecurity   Worried About Charity fundraiser in the Last Year: Never true   [place] in the Last Year: Never true  Transportation Needs: No Transportation Needs   Lack of Transportation (Medical): No   Lack of Transportation (Non-Medical): No  Physical Activity: Sufficiently Active   Days of Exercise per Week: 5 days   Minutes of Exercise per Session: 30 min  Stress: No Stress Concern Present   Feeling of Stress : Not at all  Social Connections: Socially Integrated   Frequency of Communication with Friends and Family: More than three times a week   Frequency of Social Gatherings with Friends and Family: More than three times a week   Attends Religious Services: More than 4 times per year   Active Member  of Clubs or Organizations: Yes   Attends Music therapist: More than 4 times per year   Marital Status: Married   Allergies  Allergen Reactions   Bupropion Hcl     Unsure of reaction   Lovastatin     REACTION: myalgia   Statins     weak   Venlafaxine     Unsure of reaction   Family History  Problem Relation Age of Onset   Heart disease Mother 43       chf   Hypertension Other    Colon cancer Neg Hx    Esophageal cancer Neg Hx    Stomach cancer Neg Hx    Rectal cancer Neg Hx      Current Outpatient Medications (Cardiovascular):    telmisartan (MICARDIS) 80 MG tablet, TAKE 1 TABLET(80 MG) BY MOUTH  DAILY   triamterene-hydrochlorothiazide (MAXZIDE-25) 37.5-25 MG tablet, TAKE 1 TABLET BY MOUTH DAILY   Current Outpatient Medications (Analgesics):    aspirin 81 MG tablet, Take 81 mg by mouth daily.   celecoxib (CELEBREX) 100 MG capsule, TAKE 1 CAPSULE(100 MG) BY MOUTH DAILY   Current Outpatient Medications (Other):    Ascorbic Acid (VITAMIN C) 100 MG tablet, Take 1 tablet by mouth daily.   Cholecalciferol (VITAMIN D3) 2000 units TABS, Take 1 tablet by mouth.   finasteride (PROSCAR) 5 MG tablet, Take 1 tablet (5 mg total) by mouth daily. Overdue for yearly physical w/labs must see MF for refills   omeprazole (PRILOSEC) 20 MG capsule, Take 20 mg by mouth daily.   tamsulosin (FLOMAX) 0.4 MG CAPS capsule, TAKE 1 CAPSULE(0.4 MG) BY MOUTH DAILY   Reviewed prior external information including notes and imaging from  primary care provider As well as notes that were available from care everywhere and other healthcare systems.  Past medical history, social, surgical and family history all reviewed in electronic medical record.  No pertanent information unless stated regarding to the chief complaint.   Review of Systems:  No headache, visual changes, nausea, vomiting, diarrhea, constipation, dizziness, abdominal pain, skin rash, fevers, chills, night sweats, weight loss, swollen lymph nodes,  chest pain, shortness of breath, mood changes. POSITIVE muscle aches, joint swelling, body aches  Objective  Blood pressure (!) 148/72, pulse 66, height 6' (1.829 m), weight 248 lb (112.5 kg), SpO2 95 %.   General: No apparent distress alert and oriented x3 mood and affect normal, dressed appropriately.  HEENT: Pupils equal, extraocular movements intact  Respiratory: Patient's speak in full sentences and does not appear short of breath  Cardiovascular: No lower extremity edema, non tender, no erythema  Gait antalgic gait noted Knee: Bilateral valgus deformity noted. Large thigh to calf ratio.  Tender  to palpation over medial and PF joint line.  Right greater than left ROM full in flexion and extension and lower leg rotation. instability with valgus force.  Right greater than left painful patellar compression.  Right greater than left Patellar glide with moderate crepitus. Patellar and quadriceps tendons unremarkable. Hamstring and quadriceps strength is normal.      Impression and Recommendations:     The above documentation has been reviewed and is accurate and complete Lyndal Pulley, DO

## 2021-04-17 ENCOUNTER — Ambulatory Visit (INDEPENDENT_AMBULATORY_CARE_PROVIDER_SITE_OTHER): Payer: Medicare Other | Admitting: Family Medicine

## 2021-04-17 ENCOUNTER — Other Ambulatory Visit: Payer: Self-pay

## 2021-04-17 ENCOUNTER — Encounter: Payer: Self-pay | Admitting: Family Medicine

## 2021-04-17 DIAGNOSIS — M170 Bilateral primary osteoarthritis of knee: Secondary | ICD-10-CM

## 2021-04-17 DIAGNOSIS — I2510 Atherosclerotic heart disease of native coronary artery without angina pectoris: Secondary | ICD-10-CM | POA: Diagnosis not present

## 2021-04-17 DIAGNOSIS — I2583 Coronary atherosclerosis due to lipid rich plaque: Secondary | ICD-10-CM | POA: Diagnosis not present

## 2021-04-17 NOTE — Assessment & Plan Note (Signed)
Known arthritis of the knees bilaterally.  Patient still have pain in the right knee.  Discussed with patient about the possibility of either advanced imaging or the viscosupplementation.  Patient wants to hold at this time.  Patient will follow up again in 6 to 8 weeks after more conservative therapy and would consider the possibility of repeating steroid injections repeated.  Patient is in agreement with the plan we will see him then and will call sooner if any discomfort occurs.

## 2021-04-17 NOTE — Patient Instructions (Signed)
Pennsaid Ice See me in 6-8 weeks to consider steroids

## 2021-04-20 IMAGING — CT CT CARDIAC CORONARY ARTERY CALCIUM SCORE
3 series · 14 of 20 positions shown, 16 images · non-contrast
Comparison: None.

CLINICAL DATA: Screening

EXAM:
CT CARDIAC CORONARY ARTERY CALCIUM SCORE
TECHNIQUE: Non-contrast imaging through the heart was performed using
prospective ECG gating. Image post processing was performed on an
independent workstation, allowing for quantitative analysis of the
heart and coronary arteries. Note that this exam targets the heart
and the chest was not imaged in its entirety.

[Series 2: calcium scoring 2.00 qr36 bestdiast 71% hrt calciu · axial · 0.46mm/px · z∈[+1771,+1831]mm · 4 of 50 slices shown]
[im 10/50  vessel]
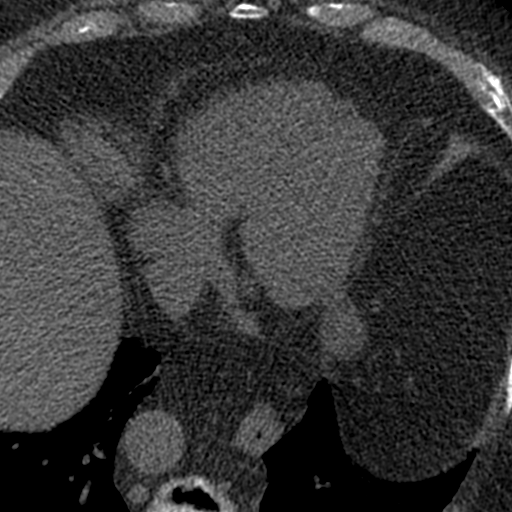
[im 20/50  vessel]
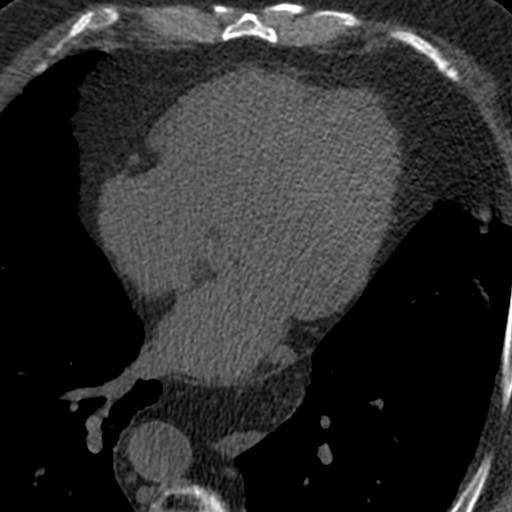
[im 30/50  vessel]
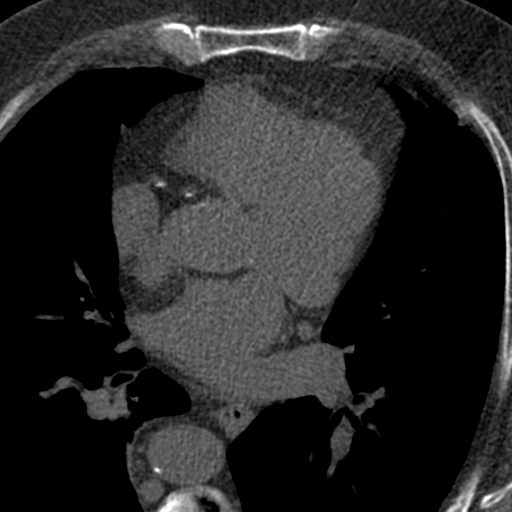
[im 40/50  vessel]
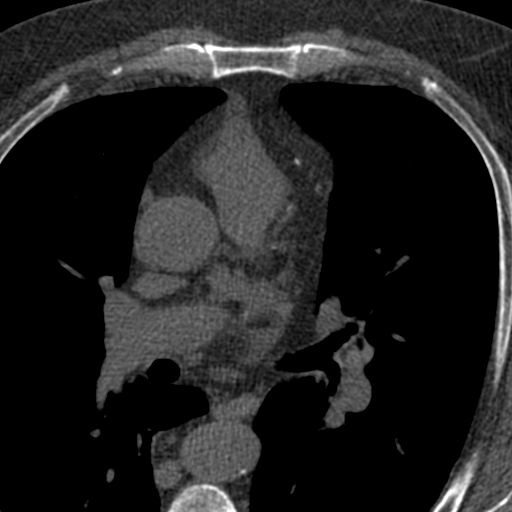

[Series 3: calcium scoring 2.00 br40 bestdiast 71% axial · axial · 0.75mm/px · z∈[+1769,+1833]mm · 5 of 50 slices shown, 7 images]
[im 9/50  vessel]
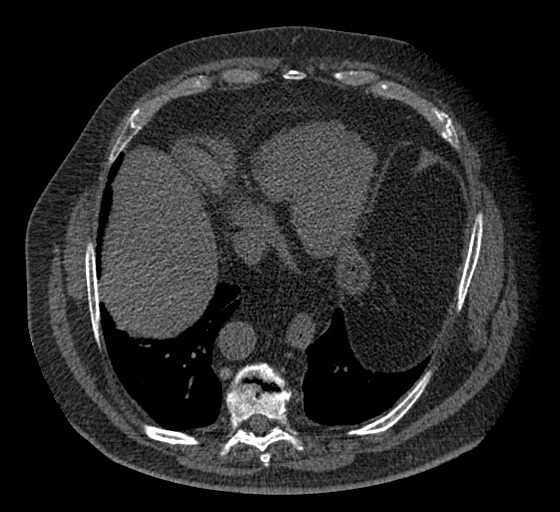
[im 9/50  lung]
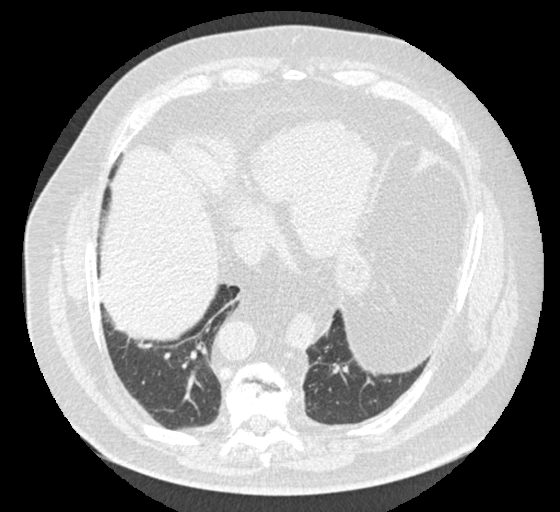
[im 17/50  vessel]
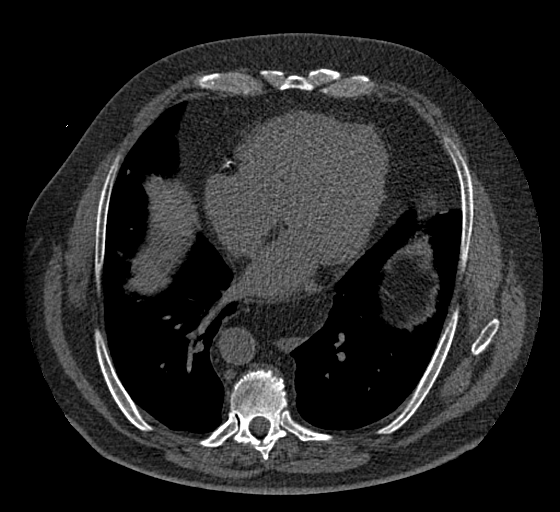
[im 25/50  vessel]
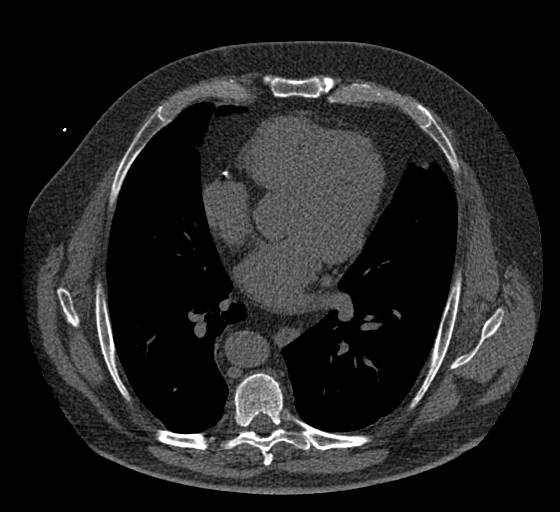
[im 33/50  vessel]
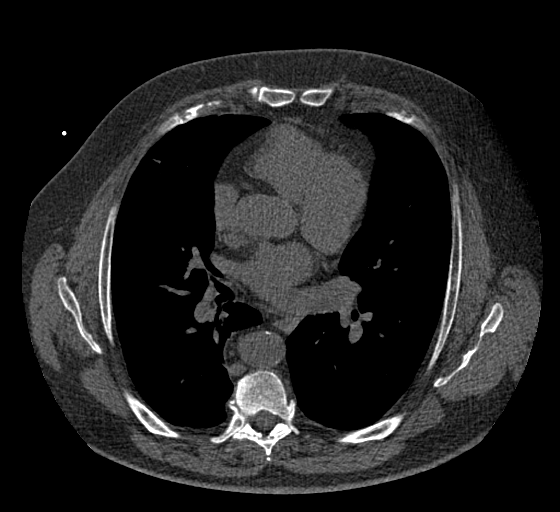
[im 41/50  vessel]
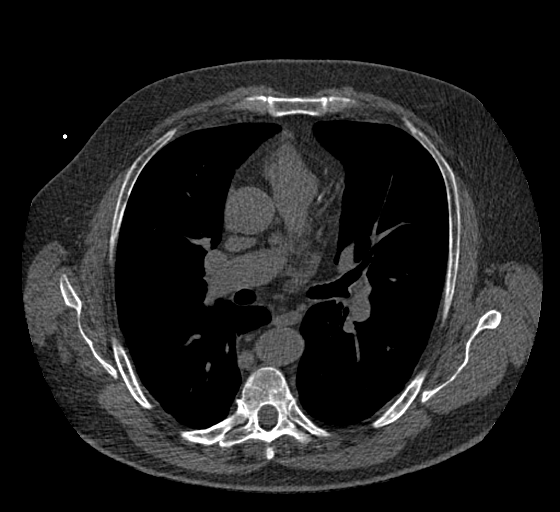
[im 41/50  lung]
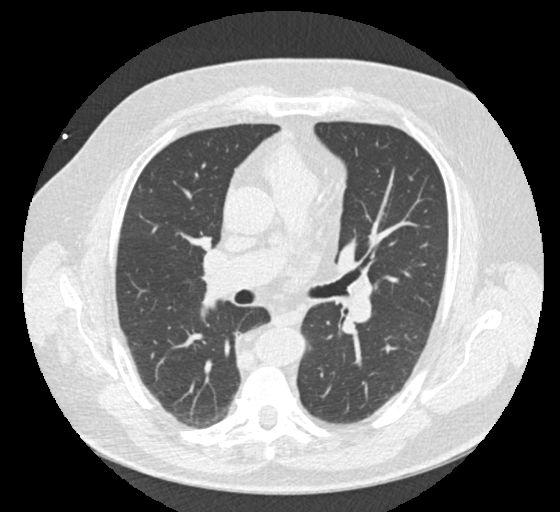

[Series 9: calcium scoring 2.00 br60 bestdiast 71% lungs · axial · 0.75mm/px · z∈[+1769,+1833]mm · 5 of 50 slices shown]
[im 9/50  vessel]
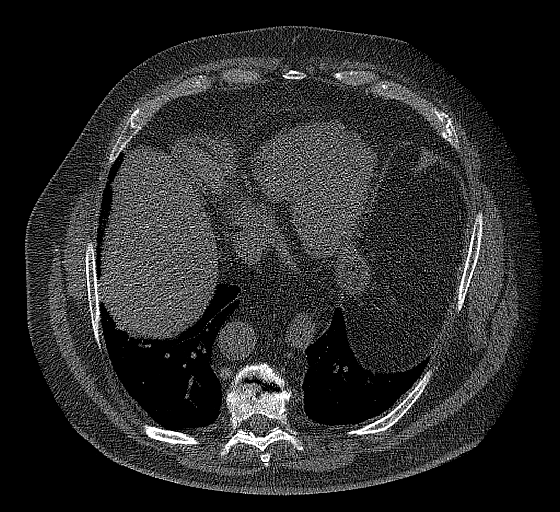
[im 17/50  vessel]
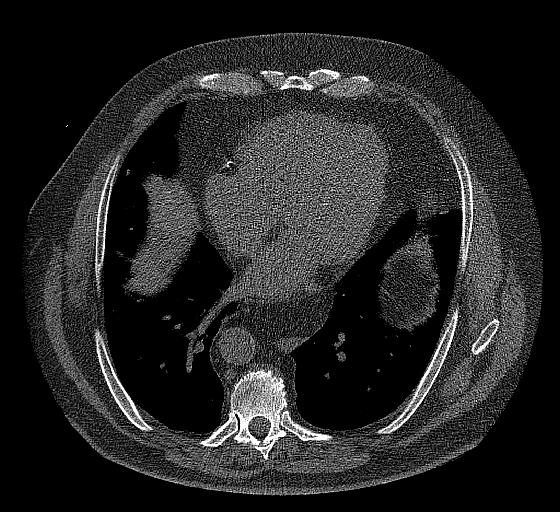
[im 25/50  vessel]
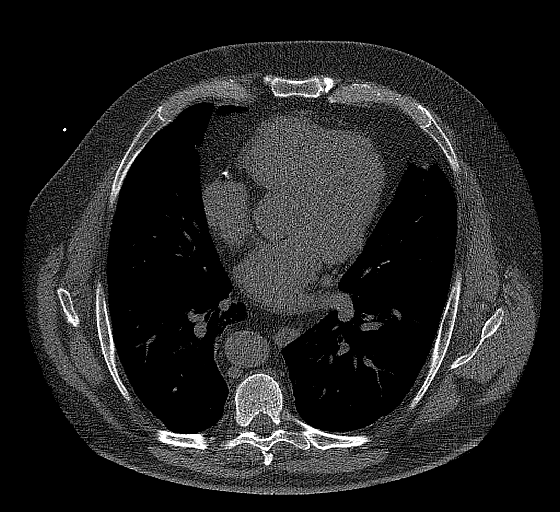
[im 33/50  vessel]
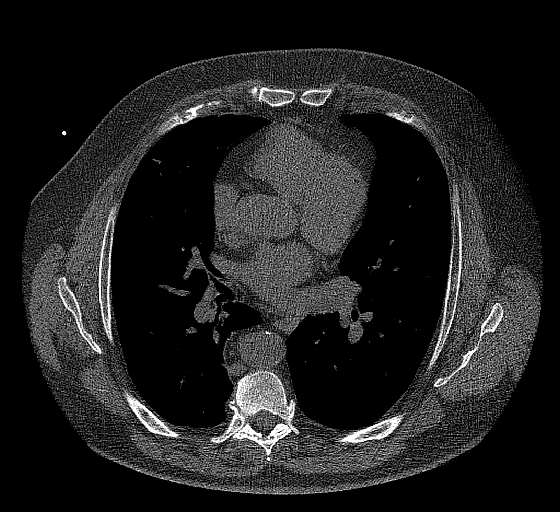
[im 41/50  vessel]
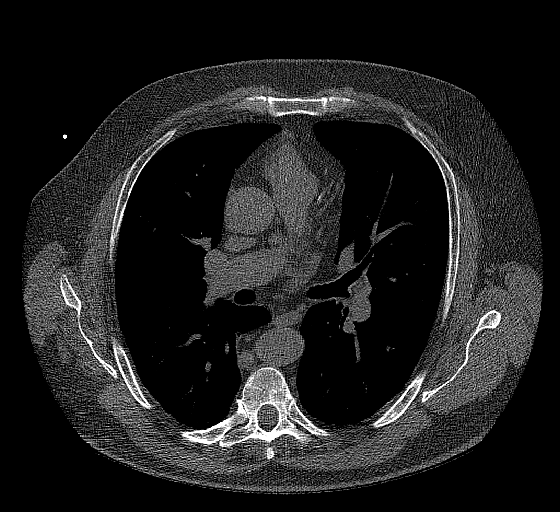

[14 of 20 positions shown; findings below may reference images not displayed]

FINDINGS: CORONARY CALCIUM SCORES:

Left Main:

LAD:

LCx: 0

RCA: 236

Total Agatston Score: 306

[HOSPITAL] percentile: 64

AORTA MEASUREMENTS:

Ascending Aorta: 32 mm

Descending Aorta: 27 mm

OTHER FINDINGS:

Heart is normal size. Aorta normal caliber. Scattered calcifications
in the descending thoracic aorta with tortuosity. No adenopathy.
Linear scarring in the lung bases. No effusions. Imaging into the
upper abdomen demonstrates no acute findings. Small nodule along the
skin surface of the anterior lower chest wall measuring 13 mm,
likely sebaceous cyst peer no acute bony abnormality.
IMPRESSION: Total Agatston score: 306

[HOSPITAL] percentile: 64

Scattered descending aortic atherosclerosis, tortuosity.

No acute extra cardiac abnormality.

## 2021-05-28 NOTE — Progress Notes (Signed)
Jeffrey [name] [place] Phone: [phone] Subjective:   Jeffrey [name], am serving as a scribe for Dr. Hulan Saas.  This visit occurred during the SARS-CoV-2 public health emergency.  Safety protocols were in place, including screening questions prior to the visit, additional usage of staff PPE, and extensive cleaning of exam room while observing appropriate contact time as indicated for disinfecting solutions.    I'm seeing this patient by the request  of:  Plotnikov, Evie Lacks, MD  CC: Bilateral knee pain  [id number]  04/17/2021 Known arthritis of the knees bilaterally.  Patient still have pain in the right knee.  Discussed with patient about the possibility of either advanced imaging or the viscosupplementation.  Patient wants to hold at this time.  Patient will follow up again in 6 to 8 weeks after more conservative therapy and would consider the possibility of repeating steroid injections repeated.  Patient is in agreement with the plan we will see him then and will call sooner if any discomfort occurs.  Update  BERNERD [name] is a 70 y.o. male coming in with complaint of B knee pain. Patient states that he has had [name] change in his pain since last visit. Patient feels pain with weight bearing and has some days without pain. R>L.  Patient states [name] significant instability.  Just pain that can stop him from activity from time to time.  Has not noticed any swelling.     Past Medical History:  Diagnosis Date   Anxiety    Arthritis    knees   Blood transfusion without reported diagnosis    BPH (benign prostatic hypertrophy)    Cataract    "beginnings of"   Chronic kidney disease    kidney infection   Depression    GERD (gastroesophageal reflux disease)    Hypertension    Meralgia paresthetica of right side    SCCA (squamous cell carcinoma) of skin 11/05/2020   Left Thigh-Anterior (in situ) (curet and 5FU)    Past Surgical History:  Procedure Laterality Date   HERNIA REPAIR     inguinal, umbilical   SPLENECTOMY     Social History   Socioeconomic History   Marital status: Married    Spouse name: Not on file   Number of children: 2   Years of education: Not on file   Highest education level: Not on file  Occupational History   Not on file  Tobacco Use   Smoking status: Never   Smokeless tobacco: Never  Vaping Use   Vaping Use: Never used  Substance and Sexual Activity   Alcohol use: Yes    Alcohol/week: 0.0 standard drinks    Comment: occasional wine   Drug use: [name]   Sexual activity: Yes  Other Topics Concern   Not on file  Social History Narrative   Regular exercise-[name]   Social Determinants of Health   Financial Resource Strain: Low Risk    Difficulty of Paying Living Expenses: Not hard at all  Food Insecurity: [name] Food Insecurity   Worried About Charity fundraiser in the Last Year: Never true   [place] in the Last Year: Never true  Transportation Needs: [name] Transportation Needs   Lack of Transportation (Medical): [name]   Lack of Transportation (Non-Medical): [name]  Physical Activity: Sufficiently Active   Days of Exercise per Week: 5 days   Minutes of Exercise per Session: 30 min  Stress: [name]  Stress Concern Present   Feeling of Stress : Not at all  Social Connections: Socially Integrated   Frequency of Communication with Friends and Family: More than three times a week   Frequency of Social Gatherings with Friends and Family: More than three times a week   Attends Religious Services: More than 4 times per year   Active Member of [employer] or Organizations: Yes   Attends Music therapist: More than 4 times per year   Marital Status: Married   Allergies  Allergen Reactions   Bupropion Hcl     Unsure of reaction   Lovastatin     REACTION: myalgia   Statins     weak   Venlafaxine     Unsure of reaction   Family History  Problem Relation Age of  Onset   Heart disease Mother 12       chf   Hypertension Other    Colon cancer Neg Hx    Esophageal cancer Neg Hx    Stomach cancer Neg Hx    Rectal cancer Neg Hx      Current Outpatient Medications (Cardiovascular):    telmisartan (MICARDIS) 80 MG tablet, TAKE 1 TABLET(80 MG) BY MOUTH DAILY   triamterene-hydrochlorothiazide (MAXZIDE-25) 37.5-25 MG tablet, TAKE 1 TABLET BY MOUTH DAILY   Current Outpatient Medications (Analgesics):    aspirin 81 MG tablet, Take 81 mg by mouth daily.   celecoxib (CELEBREX) 100 MG capsule, TAKE 1 CAPSULE(100 MG) BY MOUTH DAILY   Current Outpatient Medications (Other):    Ascorbic Acid (VITAMIN C) 100 MG tablet, Take 1 tablet by mouth daily.   Cholecalciferol (VITAMIN D3) 2000 units TABS, Take 1 tablet by mouth.   finasteride (PROSCAR) 5 MG tablet, Take 1 tablet (5 mg total) by mouth daily. Overdue for yearly physical w/labs must see MF for refills   omeprazole (PRILOSEC) 20 MG capsule, Take 20 mg by mouth daily.   tamsulosin (FLOMAX) 0.4 MG CAPS capsule, TAKE 1 CAPSULE(0.4 MG) BY MOUTH DAILY   Reviewed prior external information including notes and imaging from  primary care provider As well as notes that were available from care everywhere and other healthcare systems.  Past medical history, social, surgical and family history all reviewed in electronic medical record.  [name] pertanent information unless stated regarding to the chief complaint.   Review of Systems:  [name] headache, visual changes, nausea, vomiting, diarrhea, constipation, dizziness, abdominal pain, skin rash, fevers, chills, night sweats, weight loss, swollen lymph nodes, body aches, joint swelling, chest pain, shortness of breath, mood changes. POSITIVE muscle aches  Objective  Blood pressure 120/84, pulse (!) 59, height 6' (1.829 m), weight 253 lb (114.8 kg), SpO2 97 %.   General: [name] apparent distress alert and oriented x3 mood and affect normal, dressed appropriately.  HEENT:  Pupils equal, extraocular movements intact  Respiratory: Patient's speak in full sentences and does not appear short of breath  Cardiovascular: [name] lower extremity edema, non tender, [name] erythema  Gait mild antalgic gait Knee: Bilateral valgus deformity noted.  Abnormal thigh to calf ratio.  Tender to palpation over medial and PF joint line.  ROM full in flexion and extension and lower leg rotation. painful patellar compression. Patellar glide with moderate crepitus. Patellar and quadriceps tendons unremarkable. Hamstring and quadriceps strength is normal.  After informed written and verbal consent, patient was seated on exam table. Right knee was prepped with alcohol swab and utilizing anterolateral approach, patient's right knee space was injected with 4:1  marcaine 0.5%: Kenalog '40mg'$ /dL. Patient tolerated the procedure well without immediate complications.  After informed written and verbal consent, patient was seated on exam table. Left knee was prepped with alcohol swab and utilizing anterolateral approach, patient's left knee space was injected with 4:1  marcaine 0.5%: Kenalog '40mg'$ /dL. Patient tolerated the procedure well without immediate complications.     Impression and Recommendations:     The above documentation has been reviewed and is accurate and complete Jeffrey Pulley, Jeffrey [name]

## 2021-05-29 ENCOUNTER — Ambulatory Visit (INDEPENDENT_AMBULATORY_CARE_PROVIDER_SITE_OTHER): Payer: Medicare Other | Admitting: Family Medicine

## 2021-05-29 ENCOUNTER — Encounter: Payer: Self-pay | Admitting: Family Medicine

## 2021-05-29 ENCOUNTER — Other Ambulatory Visit: Payer: Self-pay

## 2021-05-29 DIAGNOSIS — I2583 Coronary atherosclerosis due to lipid rich plaque: Secondary | ICD-10-CM | POA: Diagnosis not present

## 2021-05-29 DIAGNOSIS — M170 Bilateral primary osteoarthritis of knee: Secondary | ICD-10-CM

## 2021-05-29 DIAGNOSIS — I2510 Atherosclerotic heart disease of native coronary artery without angina pectoris: Secondary | ICD-10-CM | POA: Diagnosis not present

## 2021-05-29 NOTE — Patient Instructions (Signed)
Good to see you. Injected knees bilaterally again. Can always do gel injections if needed but if steroids work can do them safely every 3 months. Stay active and continue icing as needed. Follow-up with me again in 3 months if needed or can call if pain seems to increase sooner.

## 2021-05-29 NOTE — Assessment & Plan Note (Signed)
Chronic problem with exacerbation.  Has responded well to the steroid injections previously.  Discussed icing regimen and home exercises.  Increase activity slowly.  Discussed which activities Which wants to avoid.  Patient can use topical anti-inflammatories if necessary.  Follow-up with me again in 3 months for further evaluation.  Could be a candidate for viscosupplementation as well.

## 2021-06-12 IMAGING — DX DG KNEE STANDING AP BILAT
1 series · 1 of 1 positions shown · non-contrast
Comparison: 12/01/2014

CLINICAL DATA: Bilateral knee pain right greater than left

EXAM:
BILATERAL KNEES STANDING - 1 VIEW

[knee ap]
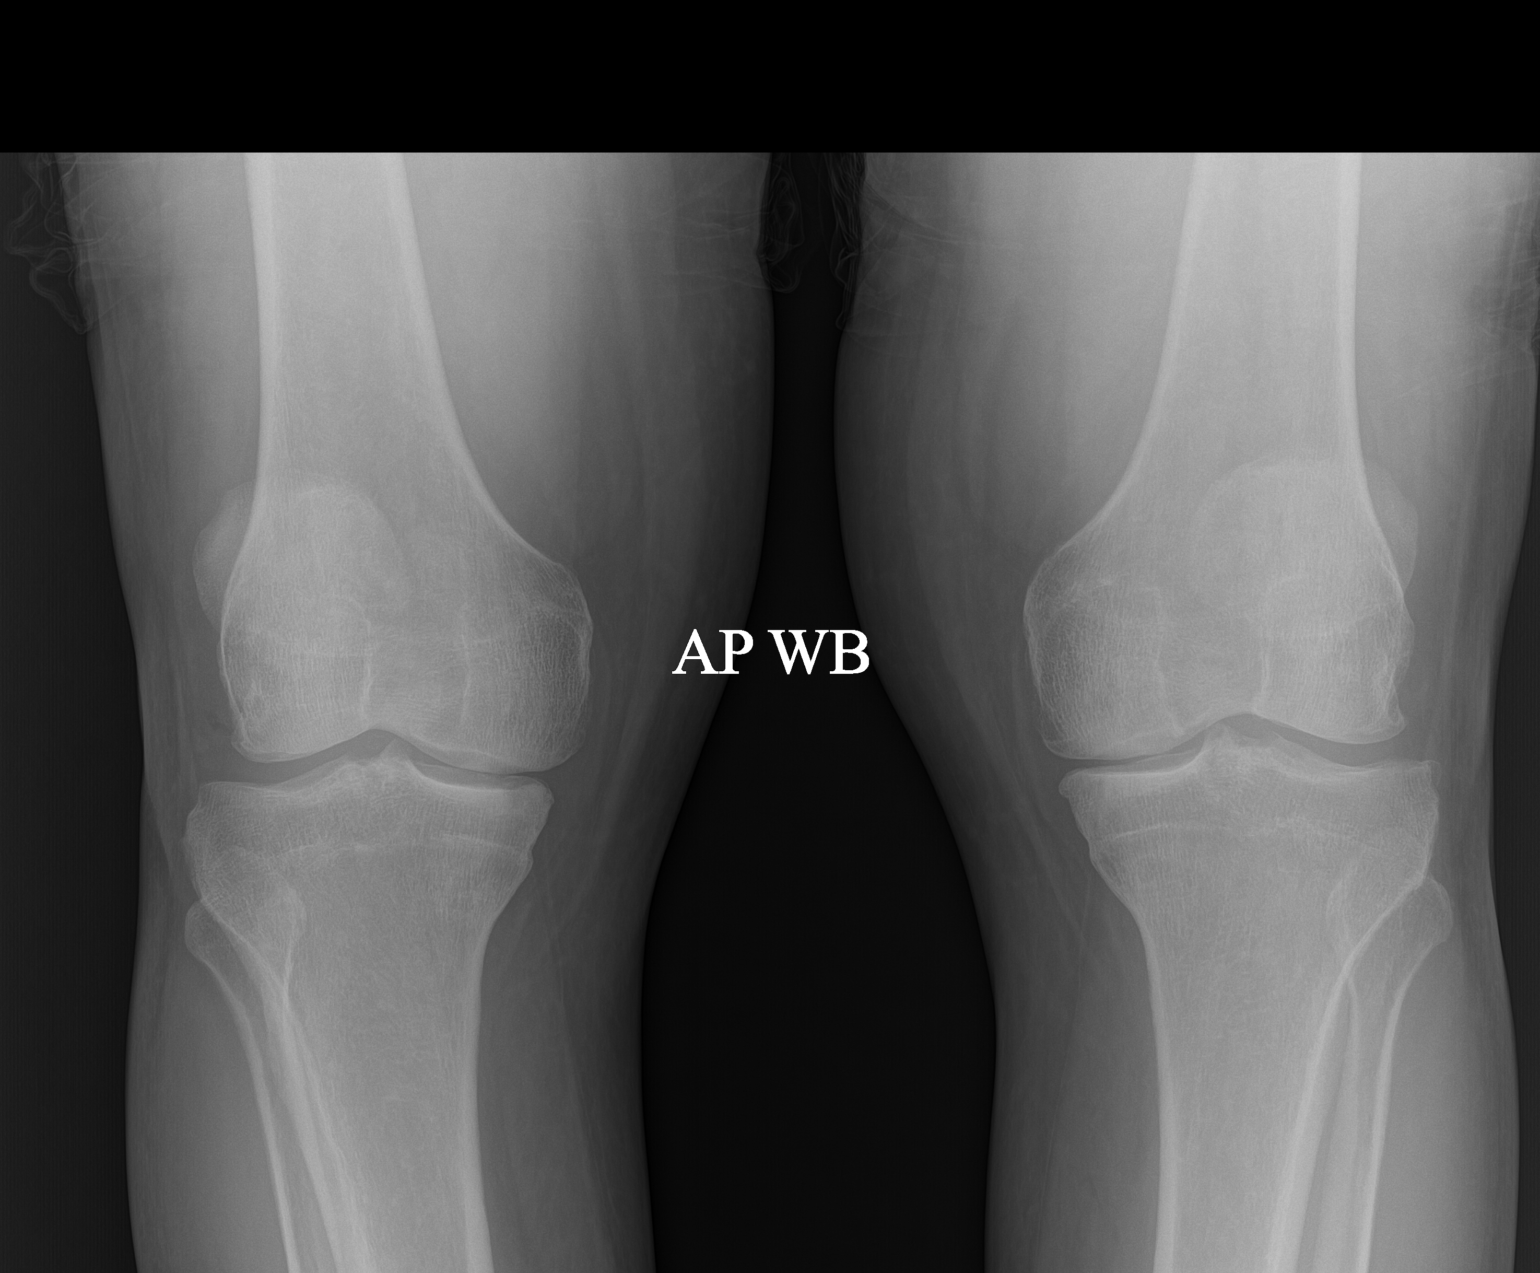

[1 of 1 positions shown; findings below may reference images not displayed]

FINDINGS: Mild degenerative changes are noted bilaterally predominately in the
medial joint space. The patellofemoral space is not evaluated on
this image. No soft tissue abnormality is seen.
IMPRESSION: Degenerative change without acute abnormality.

## 2021-06-17 NOTE — Progress Notes (Signed)
CARDIOLOGY CONSULT NOTE       Patient ID: Jeffrey [name] MRN: [id number] DOB/AGE: 1951/08/27 70 y.o.  Admit date: (Not on file) Referring Physician: Plotnikov Primary Physician: Cassandria Anger, MD Primary Cardiologist: New Reason for Consultation: CAD  Active Problems:   * No active hospital problems. *   HPI:  70 y.o. referred by Dr Laurian Brim for CAD. History of Anxiety, ARthritis, BPH. 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 He seems motivated to get in better Shape Discussed need for stress test before starting exercise plan given high calcium score  He is willing to see lipid clinic to discuss statin alternatives   ROS All other systems reviewed and negative except as noted above  Past Medical History:  Diagnosis Date   Anxiety    Arthritis    knees   Blood transfusion without reported diagnosis    BPH (benign prostatic hypertrophy)    Cataract    "beginnings of"   Chronic kidney disease    kidney infection   Depression    GERD (gastroesophageal reflux disease)    Hypertension    Meralgia paresthetica of right side    SCCA (squamous cell carcinoma) of skin 11/05/2020   Left Thigh-Anterior (in situ) (curet and 5FU)    Family History  Problem Relation Age of Onset   Heart disease Mother 63       chf   Hypertension Other    Colon cancer Neg Hx    Esophageal cancer Neg Hx    Stomach cancer Neg Hx    Rectal cancer Neg Hx     Social History   Socioeconomic History   Marital status: Married    Spouse name: Not on file   Number of children: 2   Years of education: Not on file   Highest education level: Not on file  Occupational History   Not on file   Tobacco Use   Smoking status: Never   Smokeless tobacco: Never  Vaping Use   Vaping Use: Never used  Substance and Sexual Activity   Alcohol use: Yes    Alcohol/week: 0.0 standard drinks    Comment: occasional wine   Drug use: No   Sexual activity: Yes  Other Topics Concern   Not on file  Social History Narrative   Regular exercise-No   Social Determinants of Health   Financial Resource Strain: Low Risk    Difficulty of Paying Living Expenses: Not hard at all  Food Insecurity: No Food Insecurity   Worried About Charity fundraiser in the Last Year: Never true   [place] in the Last Year: Never true  Transportation Needs: No Transportation Needs   Lack of Transportation (Medical): No   Lack of Transportation (Non-Medical): No  Physical Activity: Sufficiently Active   Days of Exercise per Week: 5 days   Minutes of Exercise per Session: 30 min  Stress: No Stress Concern Present   Feeling of Stress : Not at all  Social Connections: Socially Integrated   Frequency of Communication with Friends and Family: More than three times a week   Frequency of Social Gatherings with Friends and Family: More  than three times a week   Attends Religious Services: More than 4 times per year   Active Member of Clubs or Organizations: Yes   Attends Music therapist: More than 4 times per year   Marital Status: Married  Human resources officer Violence: Not on file    Past Surgical History:  Procedure Laterality Date   HERNIA REPAIR     inguinal, umbilical   SPLENECTOMY        Current Outpatient Medications:    Ascorbic Acid (VITAMIN C) 100 MG tablet, Take 1 tablet by mouth daily., Disp: , Rfl:    aspirin 81 MG tablet, Take 81 mg by mouth daily., Disp: , Rfl:    celecoxib (CELEBREX) 100 MG capsule, TAKE 1 CAPSULE(100 MG) BY MOUTH DAILY, Disp: 90 capsule, Rfl: 1   Cholecalciferol (VITAMIN D3) 2000 units TABS, Take 1 tablet by mouth., Disp: , Rfl:    finasteride (PROSCAR) 5  MG tablet, Take 1 tablet (5 mg total) by mouth daily. Overdue for yearly physical w/labs must see MF for refills, Disp: 90 tablet, Rfl: 3   omeprazole (PRILOSEC) 20 MG capsule, Take 20 mg by mouth daily., Disp: , Rfl:    tamsulosin (FLOMAX) 0.4 MG CAPS capsule, TAKE 1 CAPSULE(0.4 MG) BY MOUTH DAILY, Disp: 90 capsule, Rfl: 3   telmisartan (MICARDIS) 80 MG tablet, TAKE 1 TABLET(80 MG) BY MOUTH DAILY, Disp: 90 tablet, Rfl: 3   triamterene-hydrochlorothiazide (MAXZIDE-25) 37.5-25 MG tablet, TAKE 1 TABLET BY MOUTH DAILY, Disp: 90 tablet, Rfl: 3    Physical Exam: There were no vitals taken for this visit.    Affect appropriate Healthy:  appears stated age 42: normal Neck supple with no adenopathy JVP normal no bruits no thyromegaly Lungs clear with no wheezing and good diaphragmatic motion Heart:  S1/S2 no murmur, no rub, gallop or click PMI normal Abdomen: benign post splenectomy  Distal pulses intact with no bruits No edema Neuro non-focal Skin warm and dry No muscular weakness   Labs:   Lab Results  Component Value Date   WBC 8.7 11/08/2020   HGB 15.1 11/08/2020   HCT 45.4 11/08/2020   MCV 96.8 11/08/2020   PLT 352.0 11/08/2020   No results for input(s): NA, K, CL, CO2, BUN, CREATININE, CALCIUM, PROT, BILITOT, ALKPHOS, ALT, AST, GLUCOSE in the last 168 hours.  Invalid input(s): LABALBU No results found for: CKTOTAL, CKMB, CKMBINDEX, TROPONINI  Lab Results  Component Value Date   CHOL 173 07/20/2019   CHOL 183 04/14/2018   CHOL 191 12/09/2015   Lab Results  Component Value Date   HDL 46.80 07/20/2019   HDL 55.50 04/14/2018   HDL 49.20 12/09/2015   Lab Results  Component Value Date   LDLCALC 114 (H) 07/20/2019   LDLCALC 111 (H) 04/14/2018   LDLCALC 124 (H) 12/09/2015   Lab Results  Component Value Date   TRIG 60.0 07/20/2019   TRIG 83.0 04/14/2018   TRIG 89.0 12/09/2015   Lab Results  Component Value Date   CHOLHDL 4 07/20/2019   CHOLHDL 3 04/14/2018    CHOLHDL 4 12/09/2015   Lab Results  Component Value Date   LDLDIRECT 151.8 05/15/2010   LDLDIRECT 150.3 03/13/2009      Radiology: No results found.06/20/2021   EKG: 06/20/2021 NSR rate 63 normal    ASSESSMENT AND PLAN:   CAD:  subclinical noted on coronary calcium score see HPI f/u exercise myovue particularly to clear for exercise program HLD:  LDL 114 not on statin Rx  had myalgia with lovastatin and weakness with others f/u lipid clinic to discuss statin alternatives  HTN:  Well controlled.  Continue current medications and low sodium Dash type diet.   Prostate:  on flomax and proscar f/u urology PSA elevate 4.46   F/U PRN if stress test normal  Refer to lipid clinic    Signed: Jenkins Rouge 06/20/2021, 10:50 AM

## 2021-06-20 ENCOUNTER — Encounter: Payer: Self-pay | Admitting: Cardiovascular Disease

## 2021-06-20 ENCOUNTER — Other Ambulatory Visit: Payer: Self-pay

## 2021-06-20 ENCOUNTER — Ambulatory Visit (INDEPENDENT_AMBULATORY_CARE_PROVIDER_SITE_OTHER): Payer: Medicare Other | Admitting: Cardiovascular Disease

## 2021-06-20 VITALS — BP 138/84 | HR 63 | Ht 72.0 in | Wt 249.0 lb

## 2021-06-20 DIAGNOSIS — R972 Elevated prostate specific antigen [PSA]: Secondary | ICD-10-CM | POA: Diagnosis not present

## 2021-06-20 DIAGNOSIS — E782 Mixed hyperlipidemia: Secondary | ICD-10-CM | POA: Diagnosis not present

## 2021-06-20 DIAGNOSIS — I2510 Atherosclerotic heart disease of native coronary artery without angina pectoris: Secondary | ICD-10-CM | POA: Diagnosis not present

## 2021-06-20 DIAGNOSIS — R079 Chest pain, unspecified: Secondary | ICD-10-CM

## 2021-06-20 DIAGNOSIS — I10 Essential (primary) hypertension: Secondary | ICD-10-CM

## 2021-06-20 NOTE — Progress Notes (Unsigned)
Placed order for attestation. 

## 2021-06-20 NOTE — Patient Instructions (Addendum)
Medication Instructions:  Your physician recommends that you continue on your current medications as directed. Please refer to the Current Medication list given to you today.  *If you need a refill on your cardiac medications before your next appointment, please call your pharmacy*  Lab Work: If you have labs (blood work) drawn today and your tests are completely normal, you will receive your results only by: [place] (if you have MyChart) OR A paper copy in the mail If you have any lab test that is abnormal or we need to change your treatment, we will call you to review the results.  Testing/Procedures: Your physician has requested that you have en exercise stress myoview. For further information please visit [email or web address]. Please follow instruction sheet, as given.  Follow-Up: At [facility] you and your health needs are our priority.  As part of our continuing mission to provide you with exceptional heart care, we have created designated Provider Care Teams.  These Care Teams include your primary Cardiologist (physician) and Advanced Practice Providers (APPs -  Physician Assistants and Nurse Practitioners) who all work together to provide you with the care you need, when you need it.  We recommend signing up for the patient portal called "MyChart".  Sign up information is provided on this After Visit Summary.  MyChart is used to connect with patients for Virtual Visits (Telemedicine).  Patients are able to view lab/test results, encounter notes, upcoming appointments, etc.  Non-urgent messages can be sent to your provider as well.   To learn more about what you can do with MyChart, go to [email or web address].    Your next appointment:   As needed  The format for your next appointment:   In Person  Provider:   You may see Dr. Johnsie Cancel or one of the following Advanced Practice Providers on your designated Care Team:   Cecilie Kicks, NP  Other Instructions You have  been referred to lipid clinic.

## 2021-06-25 ENCOUNTER — Telehealth (HOSPITAL_COMMUNITY): Payer: Self-pay | Admitting: *Deleted

## 2021-06-25 NOTE — Telephone Encounter (Signed)
Left message on voicemail per DPR in reference to upcoming appointment scheduled on 07/02/21 at 1000 with detailed instructions given per Myocardial Perfusion Study Information Sheet for the test. LM to arrive 15 minutes early, and that it is imperative to arrive on time for appointment to keep from having the test rescheduled. If you need to cancel or reschedule your appointment, please call the office within 24 hours of your appointment. Failure to do so may result in a cancellation of your appointment, and a $50 no show fee. Phone number given for call back for any questions.  No mychart available.Maty Zeisler, Ranae Palms

## 2021-07-02 ENCOUNTER — Ambulatory Visit (HOSPITAL_COMMUNITY): Payer: Medicare Other | Attending: Cardiovascular Disease

## 2021-07-02 ENCOUNTER — Ambulatory Visit (INDEPENDENT_AMBULATORY_CARE_PROVIDER_SITE_OTHER): Payer: Medicare Other | Admitting: Pharmacist

## 2021-07-02 ENCOUNTER — Other Ambulatory Visit: Payer: Self-pay

## 2021-07-02 ENCOUNTER — Institutional Professional Consult (permissible substitution): Payer: Medicare Other

## 2021-07-02 VITALS — Wt 249.0 lb

## 2021-07-02 DIAGNOSIS — R110 Nausea: Secondary | ICD-10-CM | POA: Diagnosis not present

## 2021-07-02 DIAGNOSIS — I2583 Coronary atherosclerosis due to lipid rich plaque: Secondary | ICD-10-CM | POA: Diagnosis not present

## 2021-07-02 DIAGNOSIS — I2584 Coronary atherosclerosis due to calcified coronary lesion: Secondary | ICD-10-CM

## 2021-07-02 DIAGNOSIS — I10 Essential (primary) hypertension: Secondary | ICD-10-CM | POA: Insufficient documentation

## 2021-07-02 DIAGNOSIS — I700 Atherosclerosis of aorta: Secondary | ICD-10-CM | POA: Diagnosis not present

## 2021-07-02 DIAGNOSIS — R972 Elevated prostate specific antigen [PSA]: Secondary | ICD-10-CM | POA: Diagnosis not present

## 2021-07-02 DIAGNOSIS — I2510 Atherosclerotic heart disease of native coronary artery without angina pectoris: Secondary | ICD-10-CM

## 2021-07-02 DIAGNOSIS — E782 Mixed hyperlipidemia: Secondary | ICD-10-CM | POA: Diagnosis not present

## 2021-07-02 DIAGNOSIS — E785 Hyperlipidemia, unspecified: Secondary | ICD-10-CM | POA: Diagnosis not present

## 2021-07-02 LAB — HEPATIC FUNCTION PANEL
ALT: 13 IU/L (ref 0–44)
AST: 17 IU/L (ref 0–40)
Albumin: 3.8 g/dL (ref 3.7–4.7)
Alkaline Phosphatase: 84 IU/L (ref 44–121)
Bilirubin Total: 0.7 mg/dL (ref 0.0–1.2)
Bilirubin, Direct: 0.18 mg/dL (ref 0.00–0.40)
Total Protein: 6.4 g/dL (ref 6.0–8.5)

## 2021-07-02 LAB — LIPID PANEL
Chol/HDL Ratio: 3.6 ratio (ref 0.0–5.0)
Cholesterol, Total: 169 mg/dL (ref 100–199)
HDL: 47 mg/dL (ref >39)
LDL Chol Calc (NIH): 108 mg/dL — ABNORMAL HIGH (ref 0–99)
Triglycerides: 72 mg/dL (ref 0–149)
VLDL Cholesterol Cal: 14 mg/dL (ref 5–40)

## 2021-07-02 LAB — MYOCARDIAL PERFUSION IMAGING
Angina Index: 0
Estimated workload: 9.9
Exercise duration (min): 7 min
Exercise duration (sec): 54 s
LV dias vol: 118 mL (ref 62–150)
LV sys vol: 49 mL
MPHR: 149 {beats}/min
Nuc Stress EF: 58 %
Peak HR: 120 {beats}/min
Percent HR: 81 %
RPE: 20
Rest HR: 65 {beats}/min
Rest Nuclear Isotope Dose: 10.8 mCi
SDS: 0
SRS: 0
SSS: 0
Stress Nuclear Isotope Dose: 32.9 mCi
TID: 1.17

## 2021-07-02 MED ORDER — TECHNETIUM TC 99M TETROFOSMIN IV KIT
10.8000 | PACK | Freq: Once | INTRAVENOUS | Status: AC | PRN
  Administered 2021-07-02: 10.8 via INTRAVENOUS
  Filled 2021-07-02: qty 11

## 2021-07-02 MED ORDER — AMINOPHYLLINE 25 MG/ML IV SOLN
75.0000 mg | Freq: Once | INTRAVENOUS | Status: AC
Start: 2021-07-02 — End: 2021-07-02
  Administered 2021-07-02: 75 mg via INTRAVENOUS

## 2021-07-02 MED ORDER — REGADENOSON 0.4 MG/5ML IV SOLN
0.4000 mg | Freq: Once | INTRAVENOUS | Status: AC
  Administered 2021-07-02: 0.4 mg via INTRAVENOUS

## 2021-07-02 MED ORDER — TECHNETIUM TC 99M TETROFOSMIN IV KIT
32.9000 | PACK | Freq: Once | INTRAVENOUS | Status: AC | PRN
  Administered 2021-07-02: 32.9 via INTRAVENOUS
  Filled 2021-07-02: qty 33

## 2021-07-02 MED ORDER — ROSUVASTATIN CALCIUM 5 MG PO TABS: 5.0000 mg | ORAL_TABLET | Freq: Every day | ORAL | 11 refills | Status: DC

## 2021-07-02 NOTE — Patient Instructions (Addendum)
Your LDL goal is < 70 and was most recently 114 in 2020, we will check an updated baseline today  Start taking rosuvastatin '5mg'$  - 1 tablet once daily  Recheck fasting cholesterol Tuesday November 15th any time after 7:30am  Call Jinny Blossom, PharmD with any trouble tolerating your medication [phone]  Work to cut back on soda and bread

## 2021-07-02 NOTE — Progress Notes (Signed)
Patient ID: Jeffrey [name]                 DOB: 14-Dec-1950                    MRN: [id number]     HPI: Jeffrey [name] is a 70 y.o. male patient referred to lipid clinic by Dr Johnsie Cancel. PMH is significant for CAD with elevated calcium score of 306 (64th percentile for age/sex) on 01/04/21, HTN, HLD with statin intolerance, arthritis, anxiety, BPH, and GERD. Scheduled for exercise myoview today after lipid clinic visit.   Pt presents today in good spirits. Reports trying 1 statin in the past (lovastatin) which caused leg pain that kept him up at night. Myalgias occurred after about 1 month of therapy. Doesn't recall trying another medication after that. Has been taking OTC fish oil. Has a strong family history of premature CAD (wife, son, cousins). States weight is his biggest issue. Has cut out bread and soda in the past and lost 20+ lbs. Currently eating a lot of bread and drinking 1-2 sodas per day.  Current Medications: none Intolerances: lovastatin - myalgias Risk Factors: atherosclerosis/elevated calcium score LDL goal: '70mg'$ /dL  Diet: Has cut out carbs and soda in the past and lost weight pretty easily. Drinks water, coffee with splenda, 1-2 sodas each day (sprite or ginger ale). Likes bread a lot, meat, potatoes, veggies.  Exercise: gym 3 days a week in the past (walking and bike), hasn't been going in the past year  Family History: Mother with CHF and HLD.  Social History: Occasional alcohol use, denies tobacco and drug use.  Labs: 07/20/19: TC 173, TG 60, HDL 46.8, LDL 114 (no LLT)  Past Medical History:  Diagnosis Date   Anxiety    Arthritis    knees   Blood transfusion without reported diagnosis    BPH (benign prostatic hypertrophy)    Cataract    "beginnings of"   Chronic kidney disease    kidney infection   Depression    GERD (gastroesophageal reflux disease)    Hypertension    Meralgia paresthetica of right side    SCCA (squamous cell carcinoma) of skin 11/05/2020    Left Thigh-Anterior (in situ) (curet and 5FU)    Current Outpatient Medications on File Prior to Visit  Medication Sig Dispense Refill   Ascorbic Acid (VITAMIN C) 100 MG tablet Take 1 tablet by mouth daily.     aspirin 81 MG tablet Take 81 mg by mouth daily.     celecoxib (CELEBREX) 100 MG capsule TAKE 1 CAPSULE(100 MG) BY MOUTH DAILY 90 capsule 1   Cholecalciferol (VITAMIN D3) 2000 units TABS Take 1 tablet by mouth.     finasteride (PROSCAR) 5 MG tablet Take 1 tablet (5 mg total) by mouth daily. Overdue for yearly physical w/labs must see MF for refills 90 tablet 3   omeprazole (PRILOSEC) 20 MG capsule Take 20 mg by mouth daily.     tamsulosin (FLOMAX) 0.4 MG CAPS capsule TAKE 1 CAPSULE(0.4 MG) BY MOUTH DAILY 90 capsule 3   telmisartan (MICARDIS) 80 MG tablet TAKE 1 TABLET(80 MG) BY MOUTH DAILY 90 tablet 3   triamterene-hydrochlorothiazide (MAXZIDE-25) 37.5-25 MG tablet TAKE 1 TABLET BY MOUTH DAILY 90 tablet 3   No current facility-administered medications on file prior to visit.    Allergies  Allergen Reactions   Bupropion Hcl     Unsure of reaction   Lovastatin     REACTION: myalgia  Statins     weak   Venlafaxine     Unsure of reaction    Assessment/Plan:  1. Hyperlipidemia - Rechecking baseline lipid panel since most recent labs are 70 years old. LDL goal < 70 due to elevated calcium score. Pt is only intolerant to lovastatin, will rechallenge with low dose rosuvastatin '5mg'$  daily. Will recheck lipids in 2 months. Pt aware to call clinic with any tolerability issues before then. Can try pravastatin or ezetimibe if needed.  Arianis Bowditch E. Vanna Shavers, PharmD, BCACP, [place] [facility] [id number] [place], [place] [address] Phone: [phone]; Fax: [phone] 07/02/2021 10:01 AM

## 2021-07-03 ENCOUNTER — Other Ambulatory Visit: Payer: Self-pay | Admitting: Internal Medicine

## 2021-07-29 ENCOUNTER — Ambulatory Visit: Payer: Medicare Other | Admitting: Dermatology

## 2021-08-18 NOTE — Progress Notes (Signed)
Subjective:    Patient ID: Jeffrey [name], male    DOB: 09-Feb-1951, 70 y.o.   MRN: [id number]  This visit occurred during the SARS-CoV-2 public health emergency.  Safety protocols were in place, including screening questions prior to the visit, additional usage of staff PPE, and extensive cleaning of exam room while observing appropriate contact time as indicated for disinfecting solutions.    HPI The patient is here for an acute visit.   Right lower back, right lateral rib pain-he may have felt a small amount of discomfort few days ago in this area, but the pain really started 2 days ago.  He was not doing anything when it started.  The only thing he could have done that may have caused it if it is muscular is being in awkward position when he was working on his car.  2 days ago the pain was fairly bad and he almost thought about going to the emergency room.  The pain did improve overnight.  Pain is worse with sitting long periods.  He only has the right rib pain with certain movements and positions.  He has taken Aleve at night and that has helped.  Also getting up and moving around seems to help.  Pain does not radiate.  The pain is better.  In the past he had similar pain in 1 point and had walking pneumonia in a different time he had a kidney infection-that is why he is here today because he was concerned about those 2 possibilities.     Medications and allergies reviewed with patient and updated if appropriate.  Patient Active Problem List   Diagnosis Date Noted   Degenerative arthritis of knee 02/25/2021   Coronary artery disease due to lipid rich plaque 01/13/2021   Renal mass, right 12/17/2020   RLQ abdominal pain 59/16/3846   Umbilical hernia 65/99/3570   Neoplasm of uncertain behavior of skin 05/01/2020   Trigger finger of left hand 05/01/2020   Dyslipidemia 07/20/2019   Aortic atherosclerosis ([employer]) 04/14/2018   Right rotator cuff tear 01/13/2018   Abdominal pain  03/18/2016   Cervical disc disorder with radiculopathy of cervical region 12/06/2015   Edema 12/06/2015   Lower back pain 07/04/2015   Injury of popliteal region 12/07/2014   Right knee pain 11/13/2013   Pneumonia 12/21/2012   Fatigue 12/21/2012   Dry mouth 12/21/2012   Actinic keratoses 09/09/2012   Well adult exam 06/25/2011   Post-splenectomy 06/25/2011   PSA elevation 06/25/2011   Ventral hernia 05/23/2010   Meralgia paresthetica 03/19/2009   Anxiety state 06/25/2007   ERECTILE DYSFUNCTION 06/25/2007   DEPRESSION 06/25/2007   Essential hypertension 06/25/2007   GERD 06/25/2007   BPH (benign prostatic hyperplasia) 06/25/2007    Current Outpatient Medications on File Prior to Visit  Medication Sig Dispense Refill   Ascorbic Acid (VITAMIN C) 100 MG tablet Take 1 tablet by mouth daily.     aspirin 81 MG tablet Take 81 mg by mouth daily.     celecoxib (CELEBREX) 100 MG capsule TAKE 1 CAPSULE(100 MG) BY MOUTH DAILY 90 capsule 1   Cholecalciferol (VITAMIN D3) 2000 units TABS Take 1 tablet by mouth.     finasteride (PROSCAR) 5 MG tablet Take 1 tablet (5 mg total) by mouth daily. Overdue for yearly physical w/labs must see MF for refills 90 tablet 3   Omega-3 Fatty Acids (FISH OIL) 1000 MG CAPS Take 1 capsule by mouth daily.     omeprazole (PRILOSEC) 20  MG capsule Take 20 mg by mouth daily.     rosuvastatin (CRESTOR) 5 MG tablet Take 1 tablet (5 mg total) by mouth daily. 30 tablet 11   tamsulosin (FLOMAX) 0.4 MG CAPS capsule TAKE 1 CAPSULE(0.4 MG) BY MOUTH DAILY 90 capsule 1   telmisartan (MICARDIS) 80 MG tablet TAKE 1 TABLET(80 MG) BY MOUTH DAILY 90 tablet 3   triamterene-hydrochlorothiazide (MAXZIDE-25) 37.5-25 MG tablet TAKE 1 TABLET BY MOUTH DAILY 90 tablet 3   No current facility-administered medications on file prior to visit.    Past Medical History:  Diagnosis Date   Anxiety    Arthritis    knees   Blood transfusion without reported diagnosis    BPH (benign prostatic  hypertrophy)    Cataract    "beginnings of"   Chronic kidney disease    kidney infection   Depression    GERD (gastroesophageal reflux disease)    Hypertension    Meralgia paresthetica of right side    SCCA (squamous cell carcinoma) of skin 11/05/2020   Left Thigh-Anterior (in situ) (curet and 5FU)    Past Surgical History:  Procedure Laterality Date   HERNIA REPAIR     inguinal, umbilical   SPLENECTOMY      Social History   Socioeconomic History   Marital status: Married    Spouse name: Not on file   Number of children: 2   Years of education: Not on file   Highest education level: Not on file  Occupational History   Not on file  Tobacco Use   Smoking status: Never   Smokeless tobacco: Never  Vaping Use   Vaping Use: Never used  Substance and Sexual Activity   Alcohol use: Yes    Alcohol/week: 0.0 standard drinks    Comment: occasional wine   Drug use: No   Sexual activity: Yes  Other Topics Concern   Not on file  Social History Narrative   Regular exercise-No   Social Determinants of Health   Financial Resource Strain: Low Risk    Difficulty of Paying Living Expenses: Not hard at all  Food Insecurity: No Food Insecurity   Worried About Charity fundraiser in the Last Year: Never true   [place] in the Last Year: Never true  Transportation Needs: No Transportation Needs   Lack of Transportation (Medical): No   Lack of Transportation (Non-Medical): No  Physical Activity: Sufficiently Active   Days of Exercise per Week: 5 days   Minutes of Exercise per Session: 30 min  Stress: No Stress Concern Present   Feeling of Stress : Not at all  Social Connections: Socially Integrated   Frequency of Communication with Friends and Family: More than three times a week   Frequency of Social Gatherings with Friends and Family: More than three times a week   Attends Religious Services: More than 4 times per year   Active Member of [employer] or Organizations: Yes    Attends Music therapist: More than 4 times per year   Marital Status: Married    Family History  Problem Relation Age of Onset   Heart disease Mother 55       chf   Hypertension Other    Colon cancer Neg Hx    Esophageal cancer Neg Hx    Stomach cancer Neg Hx    Rectal cancer Neg Hx     Review of Systems  Constitutional:  Negative for fever.  Respiratory:  Positive for shortness  of breath (chronic - no change). Negative for cough, chest tightness and wheezing.   Cardiovascular:  Negative for chest pain and palpitations.  Gastrointestinal:  Negative for abdominal pain, constipation, diarrhea and nausea.  Genitourinary:  Positive for frequency (was drinking a lot of water). Negative for dysuria and hematuria.  Musculoskeletal:  Positive for back pain.      Objective:   Vitals:   08/19/21 1417  BP: 108/60  Pulse: 75  Temp: 98.5 F (36.9 C)  SpO2: 95%   BP Readings from Last 3 Encounters:  08/19/21 108/60  06/20/21 138/84  05/29/21 120/84   Wt Readings from Last 3 Encounters:  08/19/21 246 lb (111.6 kg)  07/02/21 249 lb (112.9 kg)  06/20/21 249 lb (112.9 kg)   Body mass index is 33.36 kg/m.   Physical Exam    Constitutional: Appears well-developed and well-nourished. No distress.  Head: Normocephalic and atraumatic.  Cardiovascular: Normal rate, regular rhythm and normal heart sounds.  No murmur heard. No carotid bruit .  No edema Pulmonary/Chest: Effort normal and breath sounds normal. No respiratory distress. No has no wheezes. No rales.  Abdomen: Soft, nontender, nondistended Msk: No right lateral rib pain to palpation, occasionally he will get some mild pain with certain movements. Mild pain in right lower back - paravertebral muscle region Skin: Skin is warm and dry. Not diaphoretic.       Assessment & Plan:    Right lower back pain: Acute Sounds muscular in nature Pain has improved over the past couple of days with Aleve Worse with  sitting in 1 position and better with moving around No evidence of infection in urine Will continue Aleve as needed Call or return if no improvement  Intermittent right-sided chest pain-right lateral rib pain: Acute Improved over the past couple of days Only has this with certain movements and positions-likely musculoskeletal In the past he had similar pain and had walking pneumonia, which I do not think is the case, but will go ahead and get chest x-ray today No evidence of urine infection Since pain has improved she will continue symptomatic treatment with Aleve  Urinary frequency: Acute Possibly related to increased fluid intake Urine dip here negative for infection

## 2021-08-19 ENCOUNTER — Ambulatory Visit (INDEPENDENT_AMBULATORY_CARE_PROVIDER_SITE_OTHER): Payer: Medicare Other

## 2021-08-19 ENCOUNTER — Encounter: Payer: Self-pay | Admitting: Internal Medicine

## 2021-08-19 ENCOUNTER — Other Ambulatory Visit: Payer: Self-pay

## 2021-08-19 ENCOUNTER — Ambulatory Visit (INDEPENDENT_AMBULATORY_CARE_PROVIDER_SITE_OTHER): Payer: Medicare Other | Admitting: Internal Medicine

## 2021-08-19 VITALS — BP 108/60 | HR 75 | Temp 98.5°F | Ht 72.0 in | Wt 246.0 lb

## 2021-08-19 DIAGNOSIS — I2510 Atherosclerotic heart disease of native coronary artery without angina pectoris: Secondary | ICD-10-CM | POA: Diagnosis not present

## 2021-08-19 DIAGNOSIS — R0789 Other chest pain: Secondary | ICD-10-CM

## 2021-08-19 DIAGNOSIS — I2583 Coronary atherosclerosis due to lipid rich plaque: Secondary | ICD-10-CM

## 2021-08-19 DIAGNOSIS — R350 Frequency of micturition: Secondary | ICD-10-CM

## 2021-08-19 DIAGNOSIS — M545 Low back pain, unspecified: Secondary | ICD-10-CM

## 2021-08-19 DIAGNOSIS — R079 Chest pain, unspecified: Secondary | ICD-10-CM | POA: Diagnosis not present

## 2021-08-19 NOTE — Patient Instructions (Addendum)
     A chest xray  was ordered.      Your urine does not show an infection.      Medications changes include :   none

## 2021-08-29 ENCOUNTER — Ambulatory Visit: Payer: Medicare Other | Admitting: Family Medicine

## 2021-09-09 ENCOUNTER — Other Ambulatory Visit: Payer: Medicare Other

## 2021-09-12 DIAGNOSIS — R972 Elevated prostate specific antigen [PSA]: Secondary | ICD-10-CM | POA: Diagnosis not present

## 2021-09-12 DIAGNOSIS — N401 Enlarged prostate with lower urinary tract symptoms: Secondary | ICD-10-CM | POA: Diagnosis not present

## 2021-09-12 DIAGNOSIS — Z23 Encounter for immunization: Secondary | ICD-10-CM | POA: Diagnosis not present

## 2021-09-12 DIAGNOSIS — R351 Nocturia: Secondary | ICD-10-CM | POA: Diagnosis not present

## 2021-09-12 DIAGNOSIS — N5201 Erectile dysfunction due to arterial insufficiency: Secondary | ICD-10-CM | POA: Diagnosis not present

## 2021-09-29 NOTE — Progress Notes (Signed)
[place] Phone: [phone] Subjective:   [name], am serving as a scribe for Dr. Hulan Saas.  This visit occurred during the SARS-CoV-2 public health emergency.  Safety protocols were in place, including screening questions prior to the visit, additional usage of staff PPE, and extensive cleaning of exam room while observing appropriate contact time as indicated for disinfecting solutions.   I'm seeing this patient by the request  of:  Plotnikov, Evie Lacks, MD  CC: Bilateral knee pain  [id number]  05/29/2021 Chronic problem with exacerbation.  Has responded well to the steroid injections previously.  Discussed icing regimen and home exercises.  Increase activity slowly.  Discussed which activities Which wants to avoid.  Patient can use topical anti-inflammatories if necessary.  Follow-up with me again in 3 months for further evaluation.  Could be a candidate for viscosupplementation as well.  Updated 09/30/2021 [name] is a 70 y.o. male coming in with complaint of bilateral knee pain. R>L. Pain worse on days when he is on his feet more than usual.  Patient states some mild instability but nothing severe.      Past Medical History:  Diagnosis Date   Anxiety    Arthritis    knees   Blood transfusion without reported diagnosis    BPH (benign prostatic hypertrophy)    Cataract    "beginnings of"   Chronic kidney disease    kidney infection   Depression    GERD (gastroesophageal reflux disease)    Hypertension    Meralgia paresthetica of right side    SCCA (squamous cell carcinoma) of skin 11/05/2020   Left Thigh-Anterior (in situ) (curet and 5FU)   Past Surgical History:  Procedure Laterality Date   HERNIA REPAIR     inguinal, umbilical   SPLENECTOMY     Social History   Socioeconomic History   Marital status: Married    Spouse name: Not on file   Number of children: 2    Years of education: Not on file   Highest education level: Not on file  Occupational History   Not on file  Tobacco Use   Smoking status: Never   Smokeless tobacco: Never  Vaping Use   Vaping Use: Never used  Substance and Sexual Activity   Alcohol use: Yes    Alcohol/week: 0.0 standard drinks    Comment: occasional wine   Drug use: No   Sexual activity: Yes  Other Topics Concern   Not on file  Social History Narrative   Regular exercise-No   Social Determinants of Health   Financial Resource Strain: Low Risk    Difficulty of Paying Living Expenses: Not hard at all  Food Insecurity: No Food Insecurity   Worried About Charity fundraiser in the Last Year: Never true   [place] in the Last Year: Never true  Transportation Needs: No Transportation Needs   Lack of Transportation (Medical): No   Lack of Transportation (Non-Medical): No  Physical Activity: Sufficiently Active   Days of Exercise per Week: 5 days   Minutes of Exercise per Session: 30 min  Stress: No Stress Concern Present   Feeling of Stress : Not at all  Social Connections: Socially Integrated   Frequency of Communication with Friends and Family: More than three times a week   Frequency of Social Gatherings with Friends and Family: More than three times a week   Attends Religious  Services: More than 4 times per year   Active Member of Clubs or Organizations: Yes   Attends Archivist Meetings: More than 4 times per year   Marital Status: Married   Allergies  Allergen Reactions   Bupropion Hcl     Unsure of reaction   Lovastatin     REACTION: myalgia   Statins     weak   Venlafaxine     Unsure of reaction   Family History  Problem Relation Age of Onset   Heart disease Mother 57       chf   Hypertension Other    Colon cancer Neg Hx    Esophageal cancer Neg Hx    Stomach cancer Neg Hx    Rectal cancer Neg Hx      Current Outpatient Medications (Cardiovascular):    rosuvastatin  (CRESTOR) 5 MG tablet, Take 1 tablet (5 mg total) by mouth daily.   telmisartan (MICARDIS) 80 MG tablet, TAKE 1 TABLET(80 MG) BY MOUTH DAILY   triamterene-hydrochlorothiazide (MAXZIDE-25) 37.5-25 MG tablet, TAKE 1 TABLET BY MOUTH DAILY   Current Outpatient Medications (Analgesics):    aspirin 81 MG tablet, Take 81 mg by mouth daily.   celecoxib (CELEBREX) 100 MG capsule, TAKE 1 CAPSULE(100 MG) BY MOUTH DAILY   Current Outpatient Medications (Other):    Ascorbic Acid (VITAMIN C) 100 MG tablet, Take 1 tablet by mouth daily.   Cholecalciferol (VITAMIN D3) 2000 units TABS, Take 1 tablet by mouth.   finasteride (PROSCAR) 5 MG tablet, Take 1 tablet (5 mg total) by mouth daily. Overdue for yearly physical w/labs must see MF for refills   Omega-3 Fatty Acids (FISH OIL) 1000 MG CAPS, Take 1 capsule by mouth daily.   omeprazole (PRILOSEC) 20 MG capsule, Take 20 mg by mouth daily.   tamsulosin (FLOMAX) 0.4 MG CAPS capsule, TAKE 1 CAPSULE(0.4 MG) BY MOUTH DAILY   Reviewed prior external information including notes and imaging from  primary care provider As well as notes that were available from care everywhere and other healthcare systems.  This includes patient's visits with primary care provider on October 25 as well as cardiology in September.  Past medical history, social, surgical and family history all reviewed in electronic medical record.  No pertanent information unless stated regarding to the chief complaint.   Review of Systems:  No headache, visual changes, nausea, vomiting, diarrhea, constipation, dizziness, abdominal pain, skin rash, fevers, chills, night sweats, weight loss, swollen lymph nodes, body aches, joint swelling, chest pain, shortness of breath, mood changes. POSITIVE muscle aches  Objective  Blood pressure 132/80, pulse 78, height 6' (1.829 m), weight 253 lb (114.8 kg), SpO2 96 %.   General: No apparent distress alert and oriented x3 mood and affect normal, dressed  appropriately.  HEENT: Pupils equal, extraocular movements intact  Respiratory: Patient's speak in full sentences and does not appear short of breath  Cardiovascular: trace lower extremity edema, non tender, no erythema  Gait normal with good balance and coordination.  MSK: Bilateral knees do have significant arthritic changes noted.  Trace effusion noted of both knees.  Patient does have decreased range of motion.  After informed written and verbal consent, patient was seated on exam table. Right knee was prepped with alcohol swab and utilizing anterolateral approach, patient's right knee space was injected with 4:1  marcaine 0.5%: Kenalog 40mg /dL. Patient tolerated the procedure well without immediate complications.  After informed written and verbal consent, patient was seated on exam table. Left  knee was prepped with alcohol swab and utilizing anterolateral approach, patient's left knee space was injected with 4:1  marcaine 0.5%: Kenalog 40mg /dL. Patient tolerated the procedure well without immediate complications.    Impression and Recommendations:     The above documentation has been reviewed and is accurate and complete Lyndal Pulley, DO

## 2021-09-30 ENCOUNTER — Other Ambulatory Visit: Payer: Self-pay

## 2021-09-30 ENCOUNTER — Ambulatory Visit (INDEPENDENT_AMBULATORY_CARE_PROVIDER_SITE_OTHER): Payer: Medicare Other | Admitting: Family Medicine

## 2021-09-30 ENCOUNTER — Encounter: Payer: Self-pay | Admitting: Family Medicine

## 2021-09-30 DIAGNOSIS — I2510 Atherosclerotic heart disease of native coronary artery without angina pectoris: Secondary | ICD-10-CM

## 2021-09-30 DIAGNOSIS — M170 Bilateral primary osteoarthritis of knee: Secondary | ICD-10-CM | POA: Diagnosis not present

## 2021-09-30 DIAGNOSIS — I2583 Coronary atherosclerosis due to lipid rich plaque: Secondary | ICD-10-CM

## 2021-09-30 NOTE — Assessment & Plan Note (Signed)
Chronic problem with exacerbation.  Still responding well to the steroid injection.  Discussed the possibility of viscosupplementation.  Patient wants to hold on that at this moment.  Has responded well to the steroids but would consider it in the future if necessary.  Discussed which activities for symptoms to seek medical attention.  Follow-up with me again in 3 to 4 months

## 2021-09-30 NOTE — Patient Instructions (Signed)
Injected both knees Write me if you want gel injections Would love to see pics of classic car See me again in 3-4 months

## 2021-11-02 ENCOUNTER — Other Ambulatory Visit: Payer: Self-pay | Admitting: Internal Medicine

## 2021-11-05 ENCOUNTER — Ambulatory Visit (INDEPENDENT_AMBULATORY_CARE_PROVIDER_SITE_OTHER): Payer: Medicare Other | Admitting: Dermatology

## 2021-11-05 ENCOUNTER — Other Ambulatory Visit: Payer: Self-pay

## 2021-11-05 ENCOUNTER — Encounter: Payer: Self-pay | Admitting: Dermatology

## 2021-11-05 DIAGNOSIS — L570 Actinic keratosis: Secondary | ICD-10-CM

## 2021-11-05 DIAGNOSIS — Z8589 Personal history of malignant neoplasm of other organs and systems: Secondary | ICD-10-CM

## 2021-11-05 DIAGNOSIS — Z1283 Encounter for screening for malignant neoplasm of skin: Secondary | ICD-10-CM | POA: Diagnosis not present

## 2021-11-05 DIAGNOSIS — Z85828 Personal history of other malignant neoplasm of skin: Secondary | ICD-10-CM | POA: Diagnosis not present

## 2021-11-05 MED ORDER — IMIQUIMOD 5 % EX CREA
TOPICAL_CREAM | CUTANEOUS | 0 refills | Status: AC
Start: 2021-11-05 — End: ?

## 2021-11-05 NOTE — Patient Instructions (Signed)
F/u in 4 months if thigh is not better

## 2021-11-08 ENCOUNTER — Other Ambulatory Visit: Payer: Self-pay | Admitting: Internal Medicine

## 2021-11-29 ENCOUNTER — Encounter: Payer: Self-pay | Admitting: Dermatology

## 2021-11-29 NOTE — Progress Notes (Signed)
° °  Follow-Up Visit   Subjective  Jeffrey [name] is a 71 y.o. male who presents for the following: Annual Exam (Patient wants more ln2 on the scalp and the right ear, recheck left thigh previous biopsy returning cis treated.).  More crusts face scalp and ear, recheck surgical site on leg Location:  Duration:  Quality:  Associated Signs/Symptoms: Modifying Factors:  Severity:  Timing: Context:   Objective  Well appearing patient in no apparent distress; mood and affect are within normal limits. Left Thigh - Anterior There is still some pink scaly crust after biopsy.  Left Forehead (7), Right Antihelix, Right Forehead (6) Dozen-plus gritty and hornlike 2 to 4 mm pink crusts    All skin waist up examined. Plus leg.   Assessment & Plan    History of squamous cell carcinoma Left Thigh - Anterior  We discussed minor surgery but he is comfortable first trying topical medical mode, apply Monday Wednesday Friday for 6 to 8 weeks.  Recheck area 2 months later.  Related Medications imiquimod (ALDARA) 5 % cream Apply to thigh 3 nights a wk for 8 wks  Actinic keratosis (14) Right Antihelix; Left Forehead (7); Right Forehead (6)  Destruction of lesion - Left Forehead, Right Antihelix, Right Forehead Complexity: simple   Destruction method: cryotherapy   Informed consent: discussed and consent obtained   Timeout:  patient name, date of birth, surgical site, and procedure verified Lesion destroyed using liquid nitrogen: Yes   Cryotherapy cycles:  3 Outcome: patient tolerated procedure well with no complications   Post-procedure details: wound care instructions given    Related Medications imiquimod (ALDARA) 5 % cream Apply to thigh 3 nights a wk for 8 wks      I, Lavonna Monarch, MD, have reviewed all documentation for this visit.  The documentation on 11/29/21 for the exam, diagnosis, procedures, and orders are all accurate and complete.

## 2021-12-03 ENCOUNTER — Ambulatory Visit: Payer: Medicare Other | Admitting: Internal Medicine

## 2021-12-12 ENCOUNTER — Ambulatory Visit (INDEPENDENT_AMBULATORY_CARE_PROVIDER_SITE_OTHER): Payer: Medicare Other | Admitting: Internal Medicine

## 2021-12-12 ENCOUNTER — Other Ambulatory Visit: Payer: Self-pay

## 2021-12-12 ENCOUNTER — Encounter: Payer: Self-pay | Admitting: Internal Medicine

## 2021-12-12 VITALS — BP 118/80 | HR 60 | Temp 98.2°F | Ht 72.0 in | Wt 244.0 lb

## 2021-12-12 DIAGNOSIS — I2583 Coronary atherosclerosis due to lipid rich plaque: Secondary | ICD-10-CM | POA: Diagnosis not present

## 2021-12-12 DIAGNOSIS — I10 Essential (primary) hypertension: Secondary | ICD-10-CM | POA: Diagnosis not present

## 2021-12-12 DIAGNOSIS — Z0000 Encounter for general adult medical examination without abnormal findings: Secondary | ICD-10-CM | POA: Diagnosis not present

## 2021-12-12 DIAGNOSIS — Z9081 Acquired absence of spleen: Secondary | ICD-10-CM

## 2021-12-12 DIAGNOSIS — E785 Hyperlipidemia, unspecified: Secondary | ICD-10-CM

## 2021-12-12 DIAGNOSIS — I2510 Atherosclerotic heart disease of native coronary artery without angina pectoris: Secondary | ICD-10-CM | POA: Diagnosis not present

## 2021-12-12 DIAGNOSIS — N2889 Other specified disorders of kidney and ureter: Secondary | ICD-10-CM | POA: Diagnosis not present

## 2021-12-12 DIAGNOSIS — N400 Enlarged prostate without lower urinary tract symptoms: Secondary | ICD-10-CM

## 2021-12-12 LAB — CBC WITH DIFFERENTIAL/PLATELET
Basophils Absolute: 0.2 10*3/uL — ABNORMAL HIGH (ref 0.0–0.1)
Basophils Relative: 2 % (ref 0.0–3.0)
Eosinophils Absolute: 0.2 10*3/uL (ref 0.0–0.7)
Eosinophils Relative: 2 % (ref 0.0–5.0)
HCT: 45.9 % (ref 39.0–52.0)
Hemoglobin: 15.2 g/dL (ref 13.0–17.0)
Lymphocytes Relative: 24.1 % (ref 12.0–46.0)
Lymphs Abs: 2.1 10*3/uL (ref 0.7–4.0)
MCHC: 33.1 g/dL (ref 30.0–36.0)
MCV: 96.8 fl (ref 78.0–100.0)
Monocytes Absolute: 0.8 10*3/uL (ref 0.1–1.0)
Monocytes Relative: 8.8 % (ref 3.0–12.0)
Neutro Abs: 5.5 10*3/uL (ref 1.4–7.7)
Neutrophils Relative %: 63.1 % (ref 43.0–77.0)
Platelets: 343 10*3/uL (ref 150.0–400.0)
RBC: 4.75 Mil/uL (ref 4.22–5.81)
RDW: 13.7 % (ref 11.5–15.5)
WBC: 8.7 10*3/uL (ref 4.0–10.5)

## 2021-12-12 LAB — COMPREHENSIVE METABOLIC PANEL WITH GFR
ALT: 14 U/L (ref 0–53)
AST: 17 U/L (ref 0–37)
Albumin: 4 g/dL (ref 3.5–5.2)
Alkaline Phosphatase: 68 U/L (ref 39–117)
BUN: 19 mg/dL (ref 6–23)
CO2: 36 meq/L — ABNORMAL HIGH (ref 19–32)
Calcium: 9.8 mg/dL (ref 8.4–10.5)
Chloride: 101 meq/L (ref 96–112)
Creatinine, Ser: 1.12 mg/dL (ref 0.40–1.50)
GFR: 65.92 mL/min
Glucose, Bld: 95 mg/dL (ref 70–99)
Potassium: 4.6 meq/L (ref 3.5–5.1)
Sodium: 140 meq/L (ref 135–145)
Total Bilirubin: 0.9 mg/dL (ref 0.2–1.2)
Total Protein: 6.8 g/dL (ref 6.0–8.3)

## 2021-12-12 LAB — LIPID PANEL
Cholesterol: 138 mg/dL (ref 0–200)
HDL: 48.6 mg/dL
LDL Cholesterol: 74 mg/dL (ref 0–99)
NonHDL: 89.61
Total CHOL/HDL Ratio: 3
Triglycerides: 80 mg/dL (ref 0.0–149.0)
VLDL: 16 mg/dL (ref 0.0–40.0)

## 2021-12-12 LAB — URINALYSIS
Bilirubin Urine: NEGATIVE
Hgb urine dipstick: NEGATIVE
Ketones, ur: NEGATIVE
Leukocytes,Ua: NEGATIVE
Nitrite: NEGATIVE
Specific Gravity, Urine: 1.01 (ref 1.000–1.030)
Total Protein, Urine: NEGATIVE
Urine Glucose: NEGATIVE
Urobilinogen, UA: 1 (ref 0.0–1.0)
pH: 7 (ref 5.0–8.0)

## 2021-12-12 LAB — TSH: TSH: 1.44 u[IU]/mL (ref 0.35–5.50)

## 2021-12-12 LAB — PSA: PSA: 6.1 ng/mL — ABNORMAL HIGH (ref 0.10–4.00)

## 2021-12-12 MED ORDER — AMOXICILLIN-POT CLAVULANATE 875-125 MG PO TABS: 1.0000 | ORAL_TABLET | Freq: Two times a day (BID) | ORAL | 0 refills | Status: DC

## 2021-12-12 NOTE — Patient Instructions (Signed)
For a mild COVID-19 case - take zinc 50 mg a day for 1 week, vitamin C 1000 mg daily for 1 week, vitamin D2 50,000 units weekly for 2 months (unless  taking vitamin D daily already), an antioxidant Quercetin 500 mg twice a day for 1 week (if you can get it quick enough). Take Allegra or Benadryl.  Maintain good oral hydration and take Tylenol for high fever.  Call if problems. Isolate for 5 days, then wear a mask for 5 days per CDC.  

## 2021-12-12 NOTE — Assessment & Plan Note (Signed)
Dr Diona Fanti q 12 months

## 2021-12-12 NOTE — Assessment & Plan Note (Signed)

## 2021-12-12 NOTE — Progress Notes (Signed)
Subjective:  Patient ID: Jeffrey [name], male    DOB: 1950-11-23  Age: 71 y.o. MRN: [id number]  CC: No chief complaint on file.   HPI Jeffrey [name] presents for obesity - on diet; OA, BPH, CAD Well exam S/p splenectomy  Outpatient Medications Prior to Visit  Medication Sig Dispense Refill   Ascorbic Acid (VITAMIN C) 100 MG tablet Take 1 tablet by mouth daily.     aspirin 81 MG tablet Take 81 mg by mouth daily.     celecoxib (CELEBREX) 100 MG capsule TAKE 1 CAPSULE(100 MG) BY MOUTH DAILY 90 capsule 1   Cholecalciferol (VITAMIN D3) 2000 units TABS Take 1 tablet by mouth.     finasteride (PROSCAR) 5 MG tablet Take 1 tablet (5 mg total) by mouth daily. Overdue for yearly physical w/labs must see MF for refills 90 tablet 3   imiquimod (ALDARA) 5 % cream Apply to thigh 3 nights a wk for 8 wks 6 each 0   Omega-3 Fatty Acids (FISH OIL) 1000 MG CAPS Take 1 capsule by mouth daily.     omeprazole (PRILOSEC) 20 MG capsule Take 20 mg by mouth daily.     rosuvastatin (CRESTOR) 5 MG tablet Take 1 tablet (5 mg total) by mouth daily. 30 tablet 11   telmisartan (MICARDIS) 80 MG tablet TAKE 1 TABLET(80 MG) BY MOUTH DAILY 90 tablet 3   triamterene-hydrochlorothiazide (MAXZIDE-25) 37.5-25 MG tablet TAKE 1 TABLET BY MOUTH DAILY 90 tablet 3   tamsulosin (FLOMAX) 0.4 MG CAPS capsule TAKE 1 CAPSULE(0.4 MG) BY MOUTH DAILY 90 capsule 1   No facility-administered medications prior to visit.    ROS: Review of Systems  Constitutional:  Negative for appetite change, fatigue and unexpected weight change.  HENT:  Negative for congestion, nosebleeds, sneezing, sore throat and trouble swallowing.   Eyes:  Negative for itching and visual disturbance.  Respiratory:  Negative for cough.   Cardiovascular:  Negative for chest pain, palpitations and leg swelling.  Gastrointestinal:  Negative for abdominal distention, blood in stool, diarrhea and nausea.  Genitourinary:  Negative for frequency and hematuria.   Musculoskeletal:  Negative for back pain, gait problem, joint swelling and neck pain.  Skin:  Negative for rash.  Neurological:  Negative for dizziness, tremors, speech difficulty and weakness.  Psychiatric/Behavioral:  Negative for agitation, dysphoric mood, sleep disturbance and suicidal ideas. The patient is not nervous/anxious.    Objective:  BP 118/80 (BP Location: Left Arm, Patient Position: Sitting, Cuff Size: Large)    Pulse 60    Temp 98.2 F (36.8 C) (Oral)    Ht 6' (1.829 m)    Wt 244 lb (110.7 kg)    SpO2 96%    BMI 33.09 kg/m   BP Readings from Last 3 Encounters:  12/30/21 (!) 108/58  12/12/21 118/80  09/30/21 132/80    Wt Readings from Last 3 Encounters:  12/30/21 253 lb (114.8 kg)  12/12/21 244 lb (110.7 kg)  09/30/21 253 lb (114.8 kg)    Physical Exam Constitutional:      General: He is not in acute distress.    Appearance: He is well-developed. He is obese.     Comments: NAD  Eyes:     Conjunctiva/sclera: Conjunctivae normal.     Pupils: Pupils are equal, round, and reactive to light.  Neck:     Thyroid: No thyromegaly.     Vascular: No JVD.  Cardiovascular:     Rate and Rhythm: Normal rate and regular rhythm.  Heart sounds: Normal heart sounds. No murmur heard.   No friction rub. No gallop.  Pulmonary:     Effort: Pulmonary effort is normal. No respiratory distress.     Breath sounds: Normal breath sounds. No wheezing or rales.  Chest:     Chest wall: No tenderness.  Abdominal:     General: Bowel sounds are normal. There is no distension.     Palpations: Abdomen is soft. There is no mass.     Tenderness: There is no abdominal tenderness. There is no guarding or rebound.  Musculoskeletal:        General: No tenderness. Normal range of motion.     Cervical back: Normal range of motion.  Lymphadenopathy:     Cervical: No cervical adenopathy.  Skin:    General: Skin is warm and dry.     Findings: No rash.  Neurological:     Mental Status: He  is alert and oriented to person, place, and time.     Cranial Nerves: No cranial nerve deficit.     Motor: No abnormal muscle tone.     Coordination: Coordination normal.     Gait: Gait normal.     Deep Tendon Reflexes: Reflexes are normal and symmetric.  Psychiatric:        Behavior: Behavior normal.        Thought Content: Thought content normal.        Judgment: Judgment normal.  Rectal - per Urology Midline abd hernia  I spent 22 minutes in addition to time for CPX wellness examination in preparing to see the patient by review of recent labs, imaging and procedures, obtaining and reviewing separately obtained history, communicating with the patient, ordering medications, tests or procedures, and documenting clinical information in the EHR including the differential diagnosis, treatment, and any further evaluation and other management of splenectomy, CAD         Lab Results  Component Value Date   WBC 8.7 12/12/2021   HGB 15.2 12/12/2021   HCT 45.9 12/12/2021   PLT 343.0 12/12/2021   GLUCOSE 95 12/12/2021   CHOL 138 12/12/2021   TRIG 80.0 12/12/2021   HDL 48.60 12/12/2021   LDLDIRECT 151.8 05/15/2010   LDLCALC 74 12/12/2021   ALT 14 12/12/2021   AST 17 12/12/2021   NA 140 12/12/2021   K 4.6 12/12/2021   CL 101 12/12/2021   CREATININE 1.12 12/12/2021   BUN 19 12/12/2021   CO2 36 (H) 12/12/2021   TSH 1.44 12/12/2021   PSA 6.10 (H) 12/12/2021   INR 0.93 08/12/2010    MR Abdomen W Wo Contrast  Result Date: 01/13/2021 CLINICAL DATA:  Complex right renal cyst identified by prior CT and ultrasound, right-sided abdominal pain, history of splenectomy EXAM: MRI ABDOMEN WITHOUT AND WITH CONTRAST TECHNIQUE: Multiplanar multisequence MR imaging of the abdomen was performed both before and after the administration of intravenous contrast. CONTRAST:  73mL MULTIHANCE GADOBENATE DIMEGLUMINE 529 MG/ML IV SOLN COMPARISON:  Renal ultrasound, 12/20/2020, CT abdomen pelvis, 12/02/2020  FINDINGS: Lower chest: No acute findings. Hepatobiliary: No mass or other parenchymal abnormality identified. Gallstones in the dependent gallbladder (series 9, image 13). No biliary ductal dilatation. Pancreas: No mass, inflammatory changes, or other parenchymal abnormality identified. Spleen:  Surgically absent. Adrenals/Urinary Tract: No masses identified. There is a fluid signal, nonenhancing, exophytic cyst of the lateral midportion of the right kidney measuring 2.6 x 1.5 cm (series 13, image 44, series 3, image 19). There are additional subcentimeter cysts present bilaterally, including  hemorrhagic and proteinaceous cysts. No evidence of hydronephrosis. Stomach/Bowel: Visualized portions within the abdomen are unremarkable. Vascular/Lymphatic: No pathologically enlarged lymph nodes identified. No abdominal aortic aneurysm demonstrated. Other:  None. Musculoskeletal: No suspicious bone lesions identified. IMPRESSION: 1. There is a fluid signal, nonenhancing, exophytic cyst of the lateral midportion of the right kidney measuring 2.6 x 1.5 cm, in keeping with findings of prior examinations. No further follow-up or characterization is required for this benign cyst. There are additional subcentimeter cysts present bilaterally, including hemorrhagic and proteinaceous cysts. No evidence of hydronephrosis. 2. Cholelithiasis. 3. Status post splenectomy. Electronically Signed   By: Eddie Candle M.D.   On: 01/13/2021 08:20    Assessment & Plan:   Problem List Items Addressed This Visit     BPH (benign prostatic hyperplasia)   Relevant Orders   PSA (Completed)   Coronary artery disease due to lipid rich plaque    On Crestor Cor calcium CT score 306- 2022      Dyslipidemia   Relevant Orders   TSH (Completed)   Lipid panel (Completed)   Essential hypertension    On Crestor Cont on Micardis and Maxzide      History of splenectomy    Augmentin prn Vaccinations      Renal mass, right    Dr  Diona Fanti q 12 months      Relevant Orders   CBC with Differential/Platelet (Completed)   Well adult exam - Primary     We discussed age appropriate health related issues, including available/recomended screening tests and vaccinations. Labs were ordered to be later reviewed . All questions were answered. We discussed one or more of the following - seat belt use, use of sunscreen/sun exposure exercise, fall risk reduction, second hand smoke exposure, firearm use and storage, seat belt use, a need for adhering to healthy diet and exercise. Labs were ordered.  All questions were answered.        Relevant Orders   TSH (Completed)   Urinalysis (Completed)   CBC with Differential/Platelet (Completed)   Lipid panel (Completed)   PSA (Completed)   Comprehensive metabolic panel (Completed)      Meds ordered this encounter  Medications   amoxicillin-clavulanate (AUGMENTIN) 875-125 MG tablet    Sig: Take 1 tablet by mouth 2 (two) times daily.    Dispense:  20 tablet    Refill:  0      Follow-up: Return in about 1 year (around 12/12/2022) for Wellness Exam.  Walker Kehr, MD

## 2021-12-12 NOTE — Assessment & Plan Note (Addendum)
On Crestor Cor calcium CT score 306- 2022

## 2021-12-12 NOTE — Assessment & Plan Note (Signed)
Augmentin prn Vaccinations

## 2021-12-12 NOTE — Assessment & Plan Note (Signed)
On Crestor Cont on Micardis and Maxzide

## 2021-12-23 ENCOUNTER — Telehealth: Payer: Self-pay | Admitting: Internal Medicine

## 2021-12-23 NOTE — Telephone Encounter (Signed)
Left message for patient to call back to schedule Medicare Annual Wellness Visit   Last AWV  12/30/20  Please schedule at anytime with [facility] if patient calls the office back.    40 Minutes appointment   Any questions, please call me at [phone]

## 2021-12-25 ENCOUNTER — Telehealth: Payer: Self-pay | Admitting: Internal Medicine

## 2021-12-25 NOTE — Telephone Encounter (Signed)
Patient calling in ? ?Would like copy of most recent lab results printed & mailed to address on file ?

## 2021-12-25 NOTE — Progress Notes (Signed)
?Jeffrey [name] D.O. ?[facility] ?[place] ?Phone: [phone] ?Subjective:   ?I, Jeffrey [name], am serving as a Education administrator for Dr. Hulan Saas. ?This visit occurred during the SARS-CoV-2 public health emergency.  Safety protocols were in place, including screening questions prior to the visit, additional usage of staff PPE, and extensive cleaning of exam room while observing appropriate contact time as indicated for disinfecting solutions.  ? ?I'm seeing this patient by the request  of:  Plotnikov, Jeffrey Lacks, MD ? ?CC: Bilateral knee pain ? ?GYI:[id number]  ?09/30/2021 ?Chronic problem with exacerbation.  Still responding well to the steroid injection.  Discussed the possibility of viscosupplementation.  Patient wants to hold on that at this moment.  Has responded well to the steroids but would consider it in the future if necessary.  Discussed which activities for symptoms to seek medical attention.  Follow-up with me again in 3 to 4 months ? ?Updated 12/30/2021 ?Jeffrey [name] is a 71 y.o. male coming in with complaint of bilateral knee pain. Here for injections. No new complaints. ?Patient states that the pain starts to give him more discomfort on a daily basis.  Sometimes can keep him up at night.  Sometimes feels like he does not have as much range of motion of the knees. ? ? ?  ? ?Past Medical History:  ?Diagnosis Date  ? Anxiety   ? Arthritis   ? knees  ? Blood transfusion without reported diagnosis   ? BPH (benign prostatic hypertrophy)   ? Cataract   ? "beginnings of"  ? Chronic kidney disease   ? kidney infection  ? Depression   ? GERD (gastroesophageal reflux disease)   ? Hypertension   ? Meralgia paresthetica of right side   ? SCCA (squamous cell carcinoma) of skin 11/05/2020  ? Left Thigh-Anterior (in situ) (curet and 5FU)  ? ?Past Surgical History:  ?Procedure Laterality Date  ? HERNIA REPAIR    ? inguinal, umbilical  ? SPLENECTOMY    ? ?Social History   ? ?Socioeconomic History  ? Marital status: Married  ?  Spouse name: Not on file  ? Number of children: 2  ? Years of education: Not on file  ? Highest education level: Not on file  ?Occupational History  ? Not on file  ?Tobacco Use  ? Smoking status: Never  ? Smokeless tobacco: Never  ?Vaping Use  ? Vaping Use: Never used  ?Substance and Sexual Activity  ? Alcohol use: Yes  ?  Alcohol/week: 0.0 standard drinks  ?  Comment: occasional wine  ? Drug use: No  ? Sexual activity: Yes  ?Other Topics Concern  ? Not on file  ?Social History Narrative  ? Regular exercise-No  ? ?Social Determinants of Health  ? ?Financial Resource Strain: Low Risk   ? Difficulty of Paying Living Expenses: Not hard at all  ?Food Insecurity: No Food Insecurity  ? Worried About Charity fundraiser in the Last Year: Never true  ? Ran Out of Food in the Last Year: Never true  ?Transportation Needs: No Transportation Needs  ? Lack of Transportation (Medical): No  ? Lack of Transportation (Non-Medical): No  ?Physical Activity: Sufficiently Active  ? Days of Exercise per Week: 5 days  ? Minutes of Exercise per Session: 30 min  ?Stress: No Stress Concern Present  ? Feeling of Stress : Not at all  ?Social Connections: Socially Integrated  ? Frequency of Communication with Friends and Family: More  than three times a week  ? Frequency of Social Gatherings with Friends and Family: More than three times a week  ? Attends Religious Services: More than 4 times per year  ? Active Member of Clubs or Organizations: Yes  ? Attends Archivist Meetings: More than 4 times per year  ? Marital Status: Married  ? ?Allergies  ?Allergen Reactions  ? Bupropion Hcl   ?  Unsure of reaction  ? Lovastatin   ?  REACTION: myalgia  ? Statins   ?  weak  ? Venlafaxine   ?  Unsure of reaction  ? ?Family History  ?Problem Relation Age of Onset  ? Heart disease Mother 66  ?     chf  ? Hypertension Other   ? Colon cancer Neg Hx   ? Esophageal cancer Neg Hx   ? Stomach  cancer Neg Hx   ? Rectal cancer Neg Hx   ? ? ? ?Current Outpatient Medications (Cardiovascular):  ?  rosuvastatin (CRESTOR) 5 MG tablet, Take 1 tablet (5 mg total) by mouth daily. ?  telmisartan (MICARDIS) 80 MG tablet, TAKE 1 TABLET(80 MG) BY MOUTH DAILY ?  triamterene-hydrochlorothiazide (MAXZIDE-25) 37.5-25 MG tablet, TAKE 1 TABLET BY MOUTH DAILY ? ? ?Current Outpatient Medications (Analgesics):  ?  aspirin 81 MG tablet, Take 81 mg by mouth daily. ?  celecoxib (CELEBREX) 100 MG capsule, TAKE 1 CAPSULE(100 MG) BY MOUTH DAILY ? ? ?Current Outpatient Medications (Other):  ?  amoxicillin-clavulanate (AUGMENTIN) 875-125 MG tablet, Take 1 tablet by mouth 2 (two) times daily. ?  Ascorbic Acid (VITAMIN C) 100 MG tablet, Take 1 tablet by mouth daily. ?  Cholecalciferol (VITAMIN D3) 2000 units TABS, Take 1 tablet by mouth. ?  finasteride (PROSCAR) 5 MG tablet, Take 1 tablet (5 mg total) by mouth daily. Overdue for yearly physical w/labs must see MF for refills ?  imiquimod (ALDARA) 5 % cream, Apply to thigh 3 nights a wk for 8 wks ?  Omega-3 Fatty Acids (FISH OIL) 1000 MG CAPS, Take 1 capsule by mouth daily. ?  omeprazole (PRILOSEC) 20 MG capsule, Take 20 mg by mouth daily. ?  tamsulosin (FLOMAX) 0.4 MG CAPS capsule, TAKE 1 CAPSULE(0.4 MG) BY MOUTH DAILY ? ? ?Reviewed prior external information including notes and imaging from  ?primary care provider ?As well as notes that were available from care everywhere and other healthcare systems. ? ?Past medical history, social, surgical and family history all reviewed in electronic medical record.  No pertanent information unless stated regarding to the chief complaint.  ? ?Review of Systems: ? No headache, visual changes, nausea, vomiting, diarrhea, constipation, dizziness, abdominal pain, skin rash, fevers, chills, night sweats, weight loss, swollen lymph nodes, body aches, joint swelling, chest pain, shortness of breath, mood changes. POSITIVE muscle aches ? ?Objective  ?Blood  pressure (!) 108/58, pulse 70, height 6' (1.829 m), weight 253 lb (114.8 kg), SpO2 95 %. ?  ?General: No apparent distress alert and oriented x3 mood and affect normal, dressed appropriately.  ?HEENT: Pupils equal, extraocular movements intact  ?Respiratory: Patient's speak in full sentences and does not appear short of breath  ?Cardiovascular: Trace lower extremity edema, non tender, no erythema  ?Gait mild antalgic ?Patient does have knee arthritic changes bilaterally.  Trace effusion noted.  [name] last 10 degrees of flexion bilaterally.  Instability noted with valgus and varus force. ? ?After informed written and verbal consent, patient was seated on exam table. Right knee was prepped with alcohol swab  and utilizing anterolateral approach, patient's right knee space was injected with 4:1  marcaine 0.5%: Kenalog 40mg /dL. Patient tolerated the procedure well without immediate complications. ? ?After informed written and verbal consent, patient was seated on exam table. Left knee was prepped with alcohol swab and utilizing anterolateral approach, patient's left knee space was injected with 4:1  marcaine 0.5%: Kenalog 40mg /dL. Patient tolerated the procedure well without immediate complications. ?  ?Impression and Recommendations:  ?  ? ?The above documentation has been reviewed and is accurate and complete Lyndal Pulley, DO ? ? ? ?

## 2021-12-26 NOTE — Telephone Encounter (Signed)
Labs printed. Placed envelope in basket for mailing. ?

## 2021-12-28 ENCOUNTER — Other Ambulatory Visit: Payer: Self-pay | Admitting: Internal Medicine

## 2021-12-30 ENCOUNTER — Other Ambulatory Visit: Payer: Self-pay

## 2021-12-30 ENCOUNTER — Ambulatory Visit (INDEPENDENT_AMBULATORY_CARE_PROVIDER_SITE_OTHER): Payer: Medicare Other | Admitting: Family Medicine

## 2021-12-30 DIAGNOSIS — I2583 Coronary atherosclerosis due to lipid rich plaque: Secondary | ICD-10-CM | POA: Diagnosis not present

## 2021-12-30 DIAGNOSIS — M170 Bilateral primary osteoarthritis of knee: Secondary | ICD-10-CM | POA: Diagnosis not present

## 2021-12-30 DIAGNOSIS — I2510 Atherosclerotic heart disease of native coronary artery without angina pectoris: Secondary | ICD-10-CM | POA: Diagnosis not present

## 2021-12-30 NOTE — Assessment & Plan Note (Signed)
Chronic problem with exacerbation.  We discussed with patient about the possibility of advanced imaging but patient does not want to have any surgical intervention.  Does feel the steroid injections have been helpful but not long-term at the moment.  In addition to this we did discuss the possibility of viscosupplementation which patient will get approval for.  Depending on how patient responds to these injections follow-up again in 3 months.  Encourage patient to continue the home exercises, icing regimen. ?

## 2021-12-30 NOTE — Patient Instructions (Signed)
See you again in 3 months ?We will get gel approval ?

## 2021-12-31 ENCOUNTER — Ambulatory Visit (INDEPENDENT_AMBULATORY_CARE_PROVIDER_SITE_OTHER): Payer: Medicare Other

## 2021-12-31 DIAGNOSIS — Z0000 Encounter for general adult medical examination without abnormal findings: Secondary | ICD-10-CM

## 2021-12-31 NOTE — Progress Notes (Cosign Needed)
I connected with Jeffrey [name] today by telephone and verified that I am speaking with the correct person using two identifiers. Location patient: home Location provider: work Persons participating in the virtual visit: patient, provider.   I discussed the limitations, risks, security and privacy concerns of performing an evaluation and management service by telephone and the availability of in person appointments. I also discussed with the patient that there may be a patient responsible charge related to this service. The patient expressed understanding and verbally consented to this telephonic visit.    Interactive audio and video telecommunications were attempted between this provider and patient, however failed, due to patient having technical difficulties OR patient did not have access to video capability.  We continued and completed visit with audio only.  Some vital signs may be absent or patient reported.   Time Spent with patient on telephone encounter: 30 minutes  Subjective:   Jeffrey [name] is a 71 y.o. male who presents for Medicare Annual/Subsequent preventive examination.  Review of Systems     Cardiac Risk Factors include: advanced age (>59mn, >>65women);dyslipidemia;male gender;obesity (BMI >30kg/m2);family history of premature cardiovascular disease     Objective:    There were no vitals filed for this visit. There is no height or weight on file to calculate BMI.  Advanced Directives 12/31/2021 12/30/2020 04/14/2018 02/15/2018 03/10/2017 08/13/2015 12/01/2014  Does Patient Have a Medical Advance Directive? Yes Yes Yes Yes No No;Yes No  Type of Advance Directive Living will;Healthcare Power of [place] - - Living will -  Does patient want to make changes to medical advance directive? No - Patient declined No - Patient declined - No - Patient declined - - -  Copy of [place]  Chart? No - copy requested No - copy requested No - copy requested - - - -  Would patient like information on creating a medical advance directive? - - - - Yes (ED - Information included in AVS) - No - patient declined information    Current Medications (verified) Outpatient Encounter Medications as of 12/31/2021  Medication Sig   amoxicillin-clavulanate (AUGMENTIN) 875-125 MG tablet Take 1 tablet by mouth 2 (two) times daily.   Ascorbic Acid (VITAMIN C) 100 MG tablet Take 1 tablet by mouth daily.   aspirin 81 MG tablet Take 81 mg by mouth daily.   celecoxib (CELEBREX) 100 MG capsule TAKE 1 CAPSULE(100 MG) BY MOUTH DAILY   Cholecalciferol (VITAMIN D3) 2000 units TABS Take 1 tablet by mouth.   finasteride (PROSCAR) 5 MG tablet Take 1 tablet (5 mg total) by mouth daily. Overdue for yearly physical w/labs must see MF for refills   imiquimod (ALDARA) 5 % cream Apply to thigh 3 nights a wk for 8 wks   Omega-3 Fatty Acids (FISH OIL) 1000 MG CAPS Take 1 capsule by mouth daily.   omeprazole (PRILOSEC) 20 MG capsule Take 20 mg by mouth daily.   rosuvastatin (CRESTOR) 5 MG tablet Take 1 tablet (5 mg total) by mouth daily.   tamsulosin (FLOMAX) 0.4 MG CAPS capsule TAKE 1 CAPSULE(0.4 MG) BY MOUTH DAILY   telmisartan (MICARDIS) 80 MG tablet TAKE 1 TABLET(80 MG) BY MOUTH DAILY   triamterene-hydrochlorothiazide (MAXZIDE-25) 37.5-25 MG tablet TAKE 1 TABLET BY MOUTH DAILY   No facility-administered encounter medications on file as of 12/31/2021.    Allergies (verified) Bupropion hcl, Lovastatin, Statins, and Venlafaxine   History: Past Medical History:  Diagnosis  Date   Anxiety    Arthritis    knees   Blood transfusion without reported diagnosis    BPH (benign prostatic hypertrophy)    Cataract    "beginnings of"   Chronic kidney disease    kidney infection   Depression    GERD (gastroesophageal reflux disease)    Hypertension    Meralgia paresthetica of right side    SCCA (squamous cell  carcinoma) of skin 11/05/2020   Left Thigh-Anterior (in situ) (curet and 5FU)   Past Surgical History:  Procedure Laterality Date   HERNIA REPAIR     inguinal, umbilical   SPLENECTOMY     Family History  Problem Relation Age of Onset   Heart disease Mother 69       chf   Hypertension Other    Colon cancer Neg Hx    Esophageal cancer Neg Hx    Stomach cancer Neg Hx    Rectal cancer Neg Hx    Social History   Socioeconomic History   Marital status: Married    Spouse name: Not on file   Number of children: 2   Years of education: 12   Highest education level: 12th grade  Occupational History   Not on file  Tobacco Use   Smoking status: Never   Smokeless tobacco: Never  Vaping Use   Vaping Use: Never used  Substance and Sexual Activity   Alcohol use: Yes    Alcohol/week: 0.0 standard drinks    Comment: occasional wine   Drug use: No   Sexual activity: Yes  Other Topics Concern   Not on file  Social History Narrative   Regular exercise-Yes (goes to the gym 3 times per week for 60 minutes)   Social Determinants of Health   Financial Resource Strain: Low Risk    Difficulty of Paying Living Expenses: Not hard at all  Food Insecurity: No Food Insecurity   Worried About Charity fundraiser in the Last Year: Never true   Ran Out of Food in the Last Year: Never true  Transportation Needs: No Transportation Needs   Lack of Transportation (Medical): No   Lack of Transportation (Non-Medical): No  Physical Activity: Sufficiently Active   Days of Exercise per Week: 3 days   Minutes of Exercise per Session: 60 min  Stress: No Stress Concern Present   Feeling of Stress : Not at all  Social Connections: Socially Integrated   Frequency of Communication with Friends and Family: More than three times a week   Frequency of Social Gatherings with Friends and Family: More than three times a week   Attends Religious Services: More than 4 times per year   Active Member of [employer] or  Organizations: Yes   Attends Music therapist: More than 4 times per year   Marital Status: Married    Tobacco Counseling Counseling given: Not Answered   Clinical Intake:  Pre-visit preparation completed: Yes  Pain : No/denies pain     Nutritional Risks: None Diabetes: No  How often do you need to have someone help you when you read instructions, pamphlets, or other written materials from your doctor or pharmacy?: 1 - Never What is the last grade level you completed in school?: High School Graduate  Diabetic? no  Interpreter Needed?: No  Information entered by :: Lisette Abu, LPN   Activities of Daily Living In your present state of health, do you have any difficulty performing the following activities: 12/31/2021  Hearing?  Y  Vision? N  Difficulty concentrating or making decisions? N  Walking or climbing stairs? N  Dressing or bathing? N  Doing errands, shopping? N  Preparing Food and eating ? N  Using the Toilet? N  In the past six months, have you accidently leaked urine? N  Do you have problems with loss of bowel control? N  Managing your Medications? N  Managing your Finances? N  Housekeeping or managing your Housekeeping? N  Some recent data might be hidden    Patient Care Team: Plotnikov, Evie Lacks, MD as PCP - General Franchot Gallo, MD as Attending Physician (Urology) Lavonna Monarch, MD as Consulting Physician (Dermatology)  Indicate any recent Medical Services you may have received from other than [facility] providers in the past year (date may be approximate).     Assessment:   This is a routine wellness examination for Jeffrey [name].  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 No hearing aids. Vision Screening - Comments:: Patient wears eyeglasses. Eye exam done by: [facility]  Dietary issues and exercise activities discussed: Current Exercise Habits:  Structured exercise class;Home exercise routine, Type of exercise: walking;Other - see comments;treadmill (recombent bike), Time (Minutes): 60, Frequency (Times/Week): 3, Weekly Exercise (Minutes/Week): 180, Intensity: Moderate, Exercise limited by: orthopedic condition(s);cardiac condition(s)   Goals Addressed             This Visit's Progress    Patient Stated       My goal is to lose weight and get down to 200 pounds.      Depression Screen PHQ 2/9 Scores 12/31/2021 12/30/2020 11/08/2020 04/14/2018 03/10/2017 03/10/2017 12/06/2015  PHQ - 2 Score 0 0 0 0 0 0 0  PHQ- 9 Score - - 0 0 0 - -    Fall Risk Fall Risk  12/31/2021 12/30/2020 11/08/2020 09/20/2019 05/25/2019  Falls in the past year? 0 0 0 0 (No Data)  Comment - - - [facility] Telephone Survey: data to providers prior to load [employer] Telephone Survey: data to providers prior to load  Number falls in past yr: 0 0 0 - (No Data)  Comment - - - - Emmi Telephone Survey Actual Response =   Injury with Fall? 0 0 0 - -  Risk for fall due to : No Fall Risks No Fall Risks No Fall Risks - -  Follow up Falls evaluation completed Falls evaluation completed Falls evaluation completed - -    FALL RISK PREVENTION PERTAINING TO THE HOME:  Any stairs in or around the home? Yes  If so, are there any without handrails? No  Home free of loose throw rugs in walkways, pet beds, electrical cords, etc? Yes  Adequate lighting in your home to reduce risk of falls? Yes   ASSISTIVE DEVICES UTILIZED TO PREVENT FALLS:  Life alert? No  Use of a cane, walker or w/c? No  Grab bars in the bathroom? Yes  Shower chair or bench in shower? No  Elevated toilet seat or a handicapped toilet? Yes   TIMED UP AND GO:  Was the test performed? No .  Length of time to ambulate 10 feet: n/a sec.   Gait steady and fast without use of assistive device  Cognitive Function: Normal cognitive status assessed by direct observation by this Nurse Health Advisor. No abnormalities found.           Immunizations Immunization History  Administered Date(s) Administered   Fluad Quad(high Dose 65+) 07/20/2019, 09/14/2020  Influenza Whole 10/16/2002, 09/05/2012   Influenza, High Dose Seasonal PF 09/08/2017, 10/12/2018   Influenza,inj,Quad PF,6+ Mos 09/04/2016   Influenza-Unspecified 09/05/2013, 09/16/2015   Meningococcal Polysaccharide 05/23/2010   PFIZER(Purple Top)SARS-COV-2 Vaccination 01/04/2020, 01/25/2020   Pneumococcal Conjugate-13 12/06/2015   Pneumococcal Polysaccharide-23 05/23/2010, 04/14/2018   Tdap 12/06/2013    TDAP status: Up to date  Flu Vaccine status: Up to date  Pneumococcal vaccine status: Up to date  Covid-19 vaccine status: Completed vaccines  Qualifies for Shingles Vaccine? Yes   Zostavax completed No   Shingrix Completed?: No.    Education has been provided regarding the importance of this vaccine. Patient has been advised to call insurance company to determine out of pocket expense if they have not yet received this vaccine. Advised may also receive vaccine at local pharmacy or Health Dept. Verbalized acceptance and understanding.  Screening Tests Health Maintenance  Topic Date Due   Zoster Vaccines- Shingrix (1 of 2) Never done   COVID-19 Vaccine (3 - Pfizer risk series) 02/22/2020   TETANUS/TDAP  12/07/2023   COLONOSCOPY (Pts 45-38yr Insurance coverage will need to be confirmed)  08/26/2025   Pneumonia Vaccine 72 Years old  Completed   INFLUENZA VACCINE  Completed   Hepatitis C Screening  Completed   HPV VACCINES  Aged Out    Health Maintenance  Health Maintenance Due  Topic Date Due   Zoster Vaccines- Shingrix (1 of 2) Never done   COVID-19 Vaccine (3 - Pfizer risk series) 02/22/2020    Colorectal cancer screening: Type of screening: Colonoscopy. Completed 08/27/2015. Repeat every 10 years  Lung Cancer Screening: (Low Dose CT Chest recommended if Age 72-80years, 30 pack-year currently smoking OR have quit w/in 15years.)  does not qualify.   Lung Cancer Screening Referral: no  Additional Screening:  Hepatitis C Screening: does qualify; Completed yes  Vision Screening: Recommended annual ophthalmology exams for early detection of glaucoma and other disorders of the eye. Is the patient up to date with their annual eye exam?  Yes  Who is the provider or what is the name of the office in which the patient attends annual eye exams? [facility] If pt is not established with a provider, would they like to be referred to a provider to establish care? No .   Dental Screening: Recommended annual dental exams for proper oral hygiene  Community Resource Referral / Chronic Care Management: CRR required this visit?  No   CCM required this visit?  No      Plan:     I have personally reviewed and noted the following in the patients chart:   Medical and social history Use of alcohol, tobacco or illicit drugs  Current medications and supplements including opioid prescriptions. Patient is not currently taking opioid prescriptions. Functional ability and status Nutritional status Physical activity Advanced directives List of other physicians Hospitalizations, surgeries, and ER visits in previous 12 months Vitals Screenings to include cognitive, depression, and falls Referrals and appointments  In addition, I have reviewed and discussed with patient certain preventive protocols, quality metrics, and best practice recommendations. A written personalized care plan for preventive services as well as general preventive health recommendations were provided to patient.     SSheral Flow LPN   37/01/813  Nurse Notes:  Patient is cogitatively intact. There were no vitals filed for this visit. There is no height or weight on file to calculate BMI.

## 2021-12-31 NOTE — Patient Instructions (Signed)
[name] , Thank you for taking time to come for your Medicare Wellness Visit. I appreciate your ongoing commitment to your health goals. Please review the following plan we discussed and let me know if I can assist you in the future.   Screening recommendations/referrals: Colonoscopy: 08/27/2015; due every 10 years Recommended yearly ophthalmology/optometry visit for glaucoma screening and checkup Recommended yearly dental visit for hygiene and checkup  Vaccinations: Influenza vaccine: 09/12/2021 Pneumococcal vaccine: 05/23/2010, 12/06/2015, 04/14/2018 Tdap vaccine: 12/06/2013; due every 10 years Shingles vaccine: never done   Covid-19: 01/04/2020, 01/25/2020  Advanced directives: Please bring a copy of your health care power of attorney and living will to the office at your convenience.  Conditions/risks identified: Yes; Client understands the importance of follow-up appointments with providers by attending scheduled visits and discussed goals to eat healthier, increase physical activity 5 times a week for 30 minutes each, exercise the brain by doing stimulating brain exercises (reading, adult coloring, crafting, listening to music, puzzles, etc.), socialize and enjoy life more, get enough sleep at least 8-9 hours average per night and make time for laughter.  Next appointment: Please schedule your next Medicare Wellness Visit with your Nurse Health Advisor in 1 year by calling [phone].  Preventive Care 35 Years and Older, Male Preventive care refers to lifestyle choices and visits with your health care provider that can promote health and wellness. What does preventive care include? A yearly physical exam. This is also called an annual well check. Dental exams once or twice a year. Routine eye exams. Ask your health care provider how often you should have your eyes checked. Personal lifestyle choices, including: Daily care of your teeth and gums. Regular physical activity. Eating a  healthy diet. Avoiding tobacco and drug use. Limiting alcohol use. Practicing safe sex. Taking low doses of aspirin every day. Taking vitamin and mineral supplements as recommended by your health care provider. What happens during an annual well check? The services and screenings done by your health care provider during your annual well check will depend on your age, overall health, lifestyle risk factors, and family history of disease. Counseling  Your health care provider may ask you questions about your: Alcohol use. Tobacco use. Drug use. Emotional well-being. Home and relationship well-being. Sexual activity. Eating habits. History of falls. Memory and ability to understand (cognition). Work and work Statistician. Screening  You may have the following tests or measurements: Height, weight, and BMI. Blood pressure. Lipid and cholesterol levels. These may be checked every 5 years, or more frequently if you are over 35 years old. Skin check. Lung cancer screening. You may have this screening every year starting at age 60 if you have a 30-pack-year history of smoking and currently smoke or have quit within the past 15 years. Fecal occult blood test (FOBT) of the stool. You may have this test every year starting at age 26. Flexible sigmoidoscopy or colonoscopy. You may have a sigmoidoscopy every 5 years or a colonoscopy every 10 years starting at age 71. Prostate cancer screening. Recommendations will vary depending on your family history and other risks. Hepatitis C blood test. Hepatitis B blood test. Sexually transmitted disease (STD) testing. Diabetes screening. This is done by checking your blood sugar (glucose) after you have not eaten for a while (fasting). You may have this done every 1-3 years. Abdominal aortic aneurysm (AAA) screening. You may need this if you are a current or former smoker. Osteoporosis. You may be screened starting at age 70 if  you are at high risk. Talk  with your health care provider about your test results, treatment options, and if necessary, the need for more tests. Vaccines  Your health care provider may recommend certain vaccines, such as: Influenza vaccine. This is recommended every year. Tetanus, diphtheria, and acellular pertussis (Tdap, Td) vaccine. You may need a Td booster every 10 years. Zoster vaccine. You may need this after age 6. Pneumococcal 13-valent conjugate (PCV13) vaccine. One dose is recommended after age 70. Pneumococcal polysaccharide (PPSV23) vaccine. One dose is recommended after age 31. Talk to your health care provider about which screenings and vaccines you need and how often you need them. This information is not intended to replace advice given to you by your health care provider. Make sure you discuss any questions you have with your health care provider. Document Released: 11/08/2015 Document Revised: 07/01/2016 Document Reviewed: 08/13/2015 Elsevier Interactive Patient Education  2017 [place] Prevention in the Home Falls can cause injuries. They can happen to people of all ages. There are many things you can do to make your home safe and to help prevent falls. What can I do on the outside of my home? Regularly fix the edges of walkways and driveways and fix any cracks. Remove anything that might make you trip as you walk through a door, such as a raised step or threshold. Trim any bushes or trees on the path to your home. Use bright outdoor lighting. Clear any walking paths of anything that might make someone trip, such as rocks or tools. Regularly check to see if handrails are loose or broken. Make sure that both sides of any steps have handrails. Any raised decks and porches should have guardrails on the edges. Have any leaves, snow, or ice cleared regularly. Use sand or salt on walking paths during winter. Clean up any spills in your garage right away. This includes oil or grease  spills. What can I do in the bathroom? Use night lights. Install grab bars by the toilet and in the tub and shower. Do not use towel bars as grab bars. Use non-skid mats or decals in the tub or shower. If you need to sit down in the shower, use a plastic, non-slip stool. Keep the floor dry. Clean up any water that spills on the floor as soon as it happens. Remove soap buildup in the tub or shower regularly. Attach bath mats securely with double-sided non-slip rug tape. Do not have throw rugs and other things on the floor that can make you trip. What can I do in the bedroom? Use night lights. Make sure that you have a light by your bed that is easy to reach. Do not use any sheets or blankets that are too big for your bed. They should not hang down onto the floor. Have a firm chair that has side arms. You can use this for support while you get dressed. Do not have throw rugs and other things on the floor that can make you trip. What can I do in the kitchen? Clean up any spills right away. Avoid walking on wet floors. Keep items that you use a lot in easy-to-reach places. If you need to reach something above you, use a strong step stool that has a grab bar. Keep electrical cords out of the way. Do not use floor polish or wax that makes floors slippery. If you must use wax, use non-skid floor wax. Do not have throw rugs and other things on  the floor that can make you trip. What can I do with my stairs? Do not leave any items on the stairs. Make sure that there are handrails on both sides of the stairs and use them. Fix handrails that are broken or loose. Make sure that handrails are as long as the stairways. Check any carpeting to make sure that it is firmly attached to the stairs. Fix any carpet that is loose or worn. Avoid having throw rugs at the top or bottom of the stairs. If you do have throw rugs, attach them to the floor with carpet tape. Make sure that you have a light switch at the  top of the stairs and the bottom of the stairs. If you do not have them, ask someone to add them for you. What else can I do to help prevent falls? Wear shoes that: Do not have high heels. Have rubber bottoms. Are comfortable and fit you well. Are closed at the toe. Do not wear sandals. If you use a stepladder: Make sure that it is fully opened. Do not climb a closed stepladder. Make sure that both sides of the stepladder are locked into place. Ask someone to hold it for you, if possible. Clearly mark and make sure that you can see: Any grab bars or handrails. First and last steps. Where the edge of each step is. Use tools that help you move around (mobility aids) if they are needed. These include: Canes. Walkers. Scooters. Crutches. Turn on the lights when you go into a dark area. Replace any light bulbs as soon as they burn out. Set up your furniture so you have a clear path. Avoid moving your furniture around. If any of your floors are uneven, fix them. If there are any pets around you, be aware of where they are. Review your medicines with your doctor. Some medicines can make you feel dizzy. This can increase your chance of falling. Ask your doctor what other things that you can do to help prevent falls. This information is not intended to replace advice given to you by your health care provider. Make sure you discuss any questions you have with your health care provider. Document Released: 08/08/2009 Document Revised: 03/19/2016 Document Reviewed: 11/16/2014 Elsevier Interactive Patient Education  2017 [employer].

## 2022-01-24 ENCOUNTER — Other Ambulatory Visit: Payer: Self-pay | Admitting: Internal Medicine

## 2022-03-09 ENCOUNTER — Ambulatory Visit (INDEPENDENT_AMBULATORY_CARE_PROVIDER_SITE_OTHER): Payer: Medicare Other | Admitting: Dermatology

## 2022-03-09 DIAGNOSIS — L570 Actinic keratosis: Secondary | ICD-10-CM | POA: Diagnosis not present

## 2022-03-09 MED ORDER — TOLAK 4 % EX CREA: 1.0000 "application " | TOPICAL_CREAM | Freq: Every day | CUTANEOUS | 1 refills | Status: AC

## 2022-03-11 DIAGNOSIS — R972 Elevated prostate specific antigen [PSA]: Secondary | ICD-10-CM | POA: Diagnosis not present

## 2022-03-11 DIAGNOSIS — R351 Nocturia: Secondary | ICD-10-CM | POA: Diagnosis not present

## 2022-03-11 DIAGNOSIS — N401 Enlarged prostate with lower urinary tract symptoms: Secondary | ICD-10-CM | POA: Diagnosis not present

## 2022-03-25 ENCOUNTER — Encounter: Payer: Self-pay | Admitting: Dermatology

## 2022-03-25 NOTE — Progress Notes (Signed)
   Follow-Up Visit   Subjective  Jeffrey [name] is a 71 y.o. male who presents for the following: Follow-up (On scalp. Had some lesion frozen last visit and per patient we are going to do more freezing.).  Residual crust on sun exposed areas Location:  Duration:  Quality:  Associated Signs/Symptoms: Modifying Factors:  Severity:  Timing: Context:   Objective  Well appearing patient in no apparent distress; mood and affect are within normal limits. Mid Frontal Scalp (4), Right Forehead (4), Right Temple (2), Scalp (4) Dozen plus residual hypertrophic gritty pink crusts    All sun exposed areas plus back examined.   Assessment & Plan    AK (actinic keratosis) (14) Mid Frontal Scalp (4); Right Temple (2); Right Forehead (4); Scalp (4)  Fluorouracil (TOLAK) 4 % CREA - Mid Frontal Scalp, Right Forehead, Right Temple, Scalp Apply 1 application. topically daily. Qhs x 28 days  Destruction of lesion - Mid Frontal Scalp, Right Forehead, Right Temple, Scalp Complexity: simple   Destruction method: cryotherapy   Informed consent: discussed and consent obtained   Timeout:  patient name, date of birth, surgical site, and procedure verified Lesion destroyed using liquid nitrogen: Yes   Cryotherapy cycles:  5 Outcome: patient tolerated procedure well with no complications   Post-procedure details: wound care instructions given        I, Lavonna Monarch, MD, have reviewed all documentation for this visit.  The documentation on 03/25/22 for the exam, diagnosis, procedures, and orders are all accurate and complete.

## 2022-03-31 NOTE — Progress Notes (Unsigned)
Jeffrey [name] [place] [address] [place] Phone: [phone] Subjective:   IVilma [name], am serving as a scribe for Dr. Hulan Saas.  I'm seeing this patient by the request  of:  Plotnikov, Evie Lacks, MD  CC: bilateral knee pain   [id number]  12/30/2021 Chronic problem with exacerbation.  We discussed with patient about the possibility of advanced imaging but patient does not want to have any surgical intervention.  Does feel the steroid injections have been helpful but not long-term at the moment.  In addition to this we did discuss the possibility of viscosupplementation which patient will get approval for.  Depending on how patient responds to these injections follow-up again in 3 months.  Encourage patient to continue the home exercises, icing regimen.  Updated 04/01/2022 Jeffrey [name] is a 71 y.o. male coming in with complaint of bilateral knee pain.  Noted to have arthritic changes of the knees bilaterally.  The degree though is still questionable.  Patient is declining advanced imaging.  Patient states overall continues to have some discomfort and pain.  Does not feel that the steroid injection helped as much.       Past Medical History:  Diagnosis Date   Anxiety    Arthritis    knees   Blood transfusion without reported diagnosis    BPH (benign prostatic hypertrophy)    Cataract    "beginnings of"   Chronic kidney disease    kidney infection   Depression    GERD (gastroesophageal reflux disease)    Hypertension    Meralgia paresthetica of right side    SCCA (squamous cell carcinoma) of skin 11/05/2020   Left Thigh-Anterior (in situ) (curet and 5FU)   Past Surgical History:  Procedure Laterality Date   HERNIA REPAIR     inguinal, umbilical   SPLENECTOMY     Social History   Socioeconomic History   Marital status: Married    Spouse name: Not on file   Number of children: 2   Years of education: 12   Highest  education level: 12th grade  Occupational History   Not on file  Tobacco Use   Smoking status: Never   Smokeless tobacco: Never  Vaping Use   Vaping Use: Never used  Substance and Sexual Activity   Alcohol use: Yes    Alcohol/week: 0.0 standard drinks    Comment: occasional wine   Drug use: No   Sexual activity: Yes  Other Topics Concern   Not on file  Social History Narrative   Regular exercise-Yes (goes to the gym 3 times per week for 60 minutes)   Social Determinants of Health   Financial Resource Strain: Low Risk    Difficulty of Paying Living Expenses: Not hard at all  Food Insecurity: No Food Insecurity   Worried About Charity fundraiser in the Last Year: Never true   Ran Out of Food in the Last Year: Never true  Transportation Needs: No Transportation Needs   Lack of Transportation (Medical): No   Lack of Transportation (Non-Medical): No  Physical Activity: Sufficiently Active   Days of Exercise per Week: 3 days   Minutes of Exercise per Session: 60 min  Stress: No Stress Concern Present   Feeling of Stress : Not at all  Social Connections: Socially Integrated   Frequency of Communication with Friends and Family: More than three times a week   Frequency of Social Gatherings with Friends and Family: More  than three times a week   Attends Religious Services: More than 4 times per year   Active Member of Clubs or Organizations: Yes   Attends Music therapist: More than 4 times per year   Marital Status: Married   Allergies  Allergen Reactions   Bupropion Hcl     Unsure of reaction   Lovastatin     REACTION: myalgia   Statins     weak   Venlafaxine     Unsure of reaction   Family History  Problem Relation Age of Onset   Heart disease Mother 36       chf   Hypertension Other    Colon cancer Neg Hx    Esophageal cancer Neg Hx    Stomach cancer Neg Hx    Rectal cancer Neg Hx      Current Outpatient Medications (Cardiovascular):     rosuvastatin (CRESTOR) 5 MG tablet, Take 1 tablet (5 mg total) by mouth daily.   telmisartan (MICARDIS) 80 MG tablet, TAKE 1 TABLET(80 MG) BY MOUTH DAILY   triamterene-hydrochlorothiazide (MAXZIDE-25) 37.5-25 MG tablet, TAKE 1 TABLET BY MOUTH DAILY   Current Outpatient Medications (Analgesics):    aspirin 81 MG tablet, Take 81 mg by mouth daily.   celecoxib (CELEBREX) 100 MG capsule, TAKE 1 CAPSULE(100 MG) BY MOUTH DAILY   Current Outpatient Medications (Other):    amoxicillin-clavulanate (AUGMENTIN) 875-125 MG tablet, Take 1 tablet by mouth 2 (two) times daily.   Ascorbic Acid (VITAMIN C) 100 MG tablet, Take 1 tablet by mouth daily.   Cholecalciferol (VITAMIN D3) 2000 units TABS, Take 1 tablet by mouth.   finasteride (PROSCAR) 5 MG tablet, Take 1 tablet (5 mg total) by mouth daily. Overdue for yearly physical w/labs must see MF for refills   Fluorouracil (TOLAK) 4 % CREA, Apply 1 application. topically daily. Qhs x 28 days   imiquimod (ALDARA) 5 % cream, Apply to thigh 3 nights a wk for 8 wks   Omega-3 Fatty Acids (FISH OIL) 1000 MG CAPS, Take 1 capsule by mouth daily.   omeprazole (PRILOSEC) 20 MG capsule, Take 20 mg by mouth daily.   tamsulosin (FLOMAX) 0.4 MG CAPS capsule, TAKE 1 CAPSULE(0.4 MG) BY MOUTH DAILY   Reviewed prior external information including notes and imaging from  primary care provider As well as notes that were available from care everywhere and other healthcare systems.  Past medical history, social, surgical and family history all reviewed in electronic medical record.  No pertanent information unless stated regarding to the chief complaint.   Review of Systems:  No headache, visual changes, nausea, vomiting, diarrhea, constipation, dizziness, abdominal pain, skin rash, fevers, chills, night sweats, weight loss, swollen lymph nodes, body aches, joint swelling, chest pain, shortness of breath, mood changes. POSITIVE muscle aches  Objective  Blood pressure  130/72, pulse 65, height 6' (1.829 m), weight 249 lb (112.9 kg), SpO2 95 %.   General: No apparent distress alert and oriented x3 mood and affect normal, dressed appropriately.  HEENT: Pupils equal, extraocular movements intact  Respiratory: Patient's speak in full sentences and does not appear short of breath  Cardiovascular: No lower extremity edema, non tender, no erythema  Gait patient does have some mild instability noted with valgus and varus force.  Crepitus noted of the patellofemoral joint noted.  Trace effusion in the patellofemoral joint.  After informed written and verbal consent, patient was seated on exam table. Right knee was prepped with alcohol swab and utilizing anterolateral  approach, patient's right knee space was injected with 48 mg per 3 mL of Monovisc (sodium hyaluronate) in a prefilled syringe was injected easily into the knee through a 22-gauge needle..Patient tolerated the procedure well without immediate complications.  After informed written and verbal consent, patient was seated on exam table. Left knee was prepped with alcohol swab and utilizing anterolateral approach, patient's left knee space was injected with 48 mg per 3 mL of Monovisc (sodium hyaluronate) in a prefilled syringe was injected easily into the knee through a 22-gauge needle..Patient tolerated the procedure well without immediate complications.    Impression and Recommendations:    The above documentation has been reviewed and is accurate and complete Lyndal Pulley, DO

## 2022-04-01 ENCOUNTER — Ambulatory Visit (INDEPENDENT_AMBULATORY_CARE_PROVIDER_SITE_OTHER): Payer: Medicare Other | Admitting: Family Medicine

## 2022-04-01 ENCOUNTER — Encounter: Payer: Self-pay | Admitting: Family Medicine

## 2022-04-01 DIAGNOSIS — I2510 Atherosclerotic heart disease of native coronary artery without angina pectoris: Secondary | ICD-10-CM | POA: Diagnosis not present

## 2022-04-01 DIAGNOSIS — M170 Bilateral primary osteoarthritis of knee: Secondary | ICD-10-CM

## 2022-04-01 DIAGNOSIS — I2583 Coronary atherosclerosis due to lipid rich plaque: Secondary | ICD-10-CM | POA: Diagnosis not present

## 2022-04-01 NOTE — Assessment & Plan Note (Signed)
Arthritic changes of the knees bilaterally.  Failed all conservative therapy.  Started on viscosupplementation.  Hopefully patient does respond.  Patient wants to avoid all surgical intervention if possible.  Follow-up with me again in 3 months

## 2022-04-01 NOTE — Patient Instructions (Signed)
Good to see you! Gel injections today in knees See you again in 3 months

## 2022-06-06 ENCOUNTER — Other Ambulatory Visit: Payer: Self-pay | Admitting: Cardiovascular Disease

## 2022-06-08 ENCOUNTER — Other Ambulatory Visit: Payer: Self-pay | Admitting: Cardiovascular Disease

## 2022-06-25 NOTE — Progress Notes (Signed)
[place] Phone: [phone] Subjective:   [name], am serving as a scribe for Dr. Hulan Saas.   I'm seeing this patient by the request  of:  Plotnikov, Evie Lacks, MD  CC: Bilateral knee pain  [id number]  04/01/2022 Arthritic changes of the knees bilaterally.  Failed all conservative therapy.  Started on viscosupplementation.  Hopefully patient does respond.  Patient wants to avoid all surgical intervention if possible.  Follow-up with me again in 3 months  Updated 07/02/2022 Jeffrey [name] is a 70 y.o. male coming in with complaint of bilateral knee pain. Patient states that his R knee bothers him when he walking on uneven surfaces. Using pillow at night between knees for comfort. Gel injections have provided more relief than steroid injections.        Past Medical History:  Diagnosis Date   Anxiety    Arthritis    knees   Blood transfusion without reported diagnosis    BPH (benign prostatic hypertrophy)    Cataract    "beginnings of"   Chronic kidney disease    kidney infection   Depression    GERD (gastroesophageal reflux disease)    Hypertension    Meralgia paresthetica of right side    SCCA (squamous cell carcinoma) of skin 11/05/2020   Left Thigh-Anterior (in situ) (curet and 5FU)   Past Surgical History:  Procedure Laterality Date   HERNIA REPAIR     inguinal, umbilical   SPLENECTOMY     Social History   Socioeconomic History   Marital status: Married    Spouse name: Not on file   Number of children: 2   Years of education: 12   Highest education level: 12th grade  Occupational History   Not on file  Tobacco Use   Smoking status: Never   Smokeless tobacco: Never  Vaping Use   Vaping Use: Never used  Substance and Sexual Activity   Alcohol use: Yes    Alcohol/week: 0.0 standard drinks of alcohol    Comment: occasional wine   Drug use: No   Sexual activity: Yes   Other Topics Concern   Not on file  Social History Narrative   Regular exercise-Yes (goes to the gym 3 times per week for 60 minutes)   Social Determinants of Health   Financial Resource Strain: Low Risk  (12/31/2021)   Overall Financial Resource Strain (CARDIA)    Difficulty of Paying Living Expenses: Not hard at all  Food Insecurity: No Food Insecurity (12/31/2021)   Hunger Vital Sign    Worried About Running Out of Food in the Last Year: Never true    Ran Out of Food in the Last Year: Never true  Transportation Needs: No Transportation Needs (12/31/2021)   PRAPARE - Hydrologist (Medical): No    Lack of Transportation (Non-Medical): No  Physical Activity: Sufficiently Active (12/31/2021)   Exercise Vital Sign    Days of Exercise per Week: 3 days    Minutes of Exercise per Session: 60 min  Stress: No Stress Concern Present (12/31/2021)   [place]    Feeling of Stress : Not at all  Social Connections: [place] (12/31/2021)   Social Connection and Isolation Panel [NHANES]    Frequency of Communication with Friends and Family: More than three times a week    Frequency of Social Gatherings with Friends and  Family: More than three times a week    Attends Religious Services: More than 4 times per year    Active Member of Clubs or Organizations: Yes    Attends Music therapist: More than 4 times per year    Marital Status: Married   Allergies  Allergen Reactions   Bupropion Hcl     Unsure of reaction   Lovastatin     REACTION: myalgia   Statins     weak   Venlafaxine     Unsure of reaction   Family History  Problem Relation Age of Onset   Heart disease Mother 10       chf   Hypertension Other    Colon cancer Neg Hx    Esophageal cancer Neg Hx    Stomach cancer Neg Hx    Rectal cancer Neg Hx      Current Outpatient Medications (Cardiovascular):     rosuvastatin (CRESTOR) 5 MG tablet, TAKE 1 TABLET(5 MG) BY MOUTH DAILY   telmisartan (MICARDIS) 80 MG tablet, TAKE 1 TABLET(80 MG) BY MOUTH DAILY   triamterene-hydrochlorothiazide (MAXZIDE-25) 37.5-25 MG tablet, TAKE 1 TABLET BY MOUTH DAILY   Current Outpatient Medications (Analgesics):    aspirin 81 MG tablet, Take 81 mg by mouth daily.   celecoxib (CELEBREX) 100 MG capsule, TAKE 1 CAPSULE(100 MG) BY MOUTH DAILY   Current Outpatient Medications (Other):    amoxicillin-clavulanate (AUGMENTIN) 875-125 MG tablet, Take 1 tablet by mouth 2 (two) times daily.   Ascorbic Acid (VITAMIN C) 100 MG tablet, Take 1 tablet by mouth daily.   Cholecalciferol (VITAMIN D3) 2000 units TABS, Take 1 tablet by mouth.   finasteride (PROSCAR) 5 MG tablet, Take 1 tablet (5 mg total) by mouth daily. Overdue for yearly physical w/labs must see MF for refills   Fluorouracil (TOLAK) 4 % CREA, Apply 1 application. topically daily. Qhs x 28 days   imiquimod (ALDARA) 5 % cream, Apply to thigh 3 nights a wk for 8 wks   Omega-3 Fatty Acids (FISH OIL) 1000 MG CAPS, Take 1 capsule by mouth daily.   omeprazole (PRILOSEC) 20 MG capsule, Take 20 mg by mouth daily.   tamsulosin (FLOMAX) 0.4 MG CAPS capsule, TAKE 1 CAPSULE(0.4 MG) BY MOUTH DAILY   Objective  Blood pressure 132/84, pulse 72, height 6' (1.829 m), weight 252 lb (114.3 kg), SpO2 95 %.   General: No apparent distress alert and oriented x3 mood and affect normal, dressed appropriately.  Respiratory: Patient's speak in full sentences and does not appear short of breath  Cardiovascular: No lower extremity edema, non tender, no erythema  Patient does still have a mild antalgic gait noted.  Trace effusion noted of the right knee with lateral tracking of the patella.  No instability with valgus and varus force.  Minimal discomfort on the left knee though.  Full range of motion noted.    Impression and Recommendations:     The above documentation has been reviewed  and is accurate and complete Lyndal Pulley, DO

## 2022-07-02 ENCOUNTER — Ambulatory Visit (INDEPENDENT_AMBULATORY_CARE_PROVIDER_SITE_OTHER): Payer: Medicare Other | Admitting: Family Medicine

## 2022-07-02 ENCOUNTER — Encounter: Payer: Self-pay | Admitting: Family Medicine

## 2022-07-02 DIAGNOSIS — M170 Bilateral primary osteoarthritis of knee: Secondary | ICD-10-CM | POA: Diagnosis not present

## 2022-07-02 DIAGNOSIS — I2510 Atherosclerotic heart disease of native coronary artery without angina pectoris: Secondary | ICD-10-CM

## 2022-07-02 DIAGNOSIS — I2583 Coronary atherosclerosis due to lipid rich plaque: Secondary | ICD-10-CM | POA: Diagnosis not present

## 2022-07-02 NOTE — Assessment & Plan Note (Signed)
Patient has responded extremely well to the viscosupplementation.  Discussed we can repeat every 6 months if needed.  Patient will have another follow-up in 3 months for further evaluation

## 2022-07-02 NOTE — Patient Instructions (Signed)
Glad you are doing better Can repeat the gel after December 7th  See me after that date

## 2022-09-03 ENCOUNTER — Other Ambulatory Visit: Payer: Self-pay | Admitting: Cardiovascular Disease

## 2022-09-03 DIAGNOSIS — R972 Elevated prostate specific antigen [PSA]: Secondary | ICD-10-CM | POA: Diagnosis not present

## 2022-09-14 DIAGNOSIS — Z23 Encounter for immunization: Secondary | ICD-10-CM | POA: Diagnosis not present

## 2022-09-29 NOTE — Progress Notes (Signed)
[place] Phone: [phone] Subjective:    I'm seeing this patient by the request  of:  Plotnikov, Evie Lacks, MD  CC: Bilateral knee pain  [id number]  07/02/2022 Patient has responded extremely well to the viscosupplementation.  Discussed we can repeat every 6 months if needed.  Patient will have another follow-up in 3 months for further evaluation      Update 10/01/2022 Jeffrey [name] is a 71 y.o. male coming in with complaint of B knee pain. [place] authorized. Patient states that his knees are hurting a lot the last couple of weeks R>L. Patient started having left shoulder pain for about two weeks no MOI pain just started.patient locates the pain to the front of his shoulder and sleeping on that side makes it worse and morning is when the pain is at is worse and gets to be throbbing like a toothache.        Past Medical History:  Diagnosis Date   Anxiety    Arthritis    knees   Blood transfusion without reported diagnosis    BPH (benign prostatic hypertrophy)    Cataract    "beginnings of"   Chronic kidney disease    kidney infection   Depression    GERD (gastroesophageal reflux disease)    Hypertension    Meralgia paresthetica of right side    SCCA (squamous cell carcinoma) of skin 11/05/2020   Left Thigh-Anterior (in situ) (curet and 5FU)   Past Surgical History:  Procedure Laterality Date   HERNIA REPAIR     inguinal, umbilical   SPLENECTOMY     Social History   Socioeconomic History   Marital status: Married    Spouse name: Not on file   Number of children: 2   Years of education: 12   Highest education level: 12th grade  Occupational History   Not on file  Tobacco Use   Smoking status: Never   Smokeless tobacco: Never  Vaping Use   Vaping Use: Never used  Substance and Sexual Activity   Alcohol use: Yes    Alcohol/week: 0.0 standard drinks of alcohol    Comment:  occasional wine   Drug use: No   Sexual activity: Yes  Other Topics Concern   Not on file  Social History Narrative   Regular exercise-Yes (goes to the gym 3 times per week for 60 minutes)   Social Determinants of Health   Financial Resource Strain: Low Risk  (12/31/2021)   Overall Financial Resource Strain (CARDIA)    Difficulty of Paying Living Expenses: Not hard at all  Food Insecurity: No Food Insecurity (12/31/2021)   Hunger Vital Sign    Worried About Running Out of Food in the Last Year: Never true    Ran Out of Food in the Last Year: Never true  Transportation Needs: No Transportation Needs (12/31/2021)   PRAPARE - Hydrologist (Medical): No    Lack of Transportation (Non-Medical): No  Physical Activity: Sufficiently Active (12/31/2021)   Exercise Vital Sign    Days of Exercise per Week: 3 days    Minutes of Exercise per Session: 60 min  Stress: No Stress Concern Present (12/31/2021)   [place]    Feeling of Stress : Not at all  Social Connections: [place] (12/31/2021)   Social Connection and Isolation Panel [NHANES]    Frequency of Communication with  Friends and Family: More than three times a week    Frequency of Social Gatherings with Friends and Family: More than three times a week    Attends Religious Services: More than 4 times per year    Active Member of [employer] or Organizations: Yes    Attends Music therapist: More than 4 times per year    Marital Status: Married   Allergies  Allergen Reactions   Bupropion Hcl     Unsure of reaction   Lovastatin     REACTION: myalgia   Statins     weak   Venlafaxine     Unsure of reaction   Family History  Problem Relation Age of Onset   Heart disease Mother 73       chf   Hypertension Other    Colon cancer Neg Hx    Esophageal cancer Neg Hx    Stomach cancer Neg Hx    Rectal cancer Neg Hx       Current Outpatient Medications (Cardiovascular):    rosuvastatin (CRESTOR) 5 MG tablet, Take 1 tablet (5 mg total) by mouth daily.   telmisartan (MICARDIS) 80 MG tablet, TAKE 1 TABLET(80 MG) BY MOUTH DAILY   triamterene-hydrochlorothiazide (MAXZIDE-25) 37.5-25 MG tablet, TAKE 1 TABLET BY MOUTH DAILY   Current Outpatient Medications (Analgesics):    aspirin 81 MG tablet, Take 81 mg by mouth daily.   celecoxib (CELEBREX) 100 MG capsule, TAKE 1 CAPSULE(100 MG) BY MOUTH DAILY   Current Outpatient Medications (Other):    amoxicillin-clavulanate (AUGMENTIN) 875-125 MG tablet, Take 1 tablet by mouth 2 (two) times daily.   Ascorbic Acid (VITAMIN C) 100 MG tablet, Take 1 tablet by mouth daily.   Cholecalciferol (VITAMIN D3) 2000 units TABS, Take 1 tablet by mouth.   finasteride (PROSCAR) 5 MG tablet, Take 1 tablet (5 mg total) by mouth daily. Overdue for yearly physical w/labs must see MF for refills   Fluorouracil (TOLAK) 4 % CREA, Apply 1 application. topically daily. Qhs x 28 days   imiquimod (ALDARA) 5 % cream, Apply to thigh 3 nights a wk for 8 wks   Omega-3 Fatty Acids (FISH OIL) 1000 MG CAPS, Take 1 capsule by mouth daily.   omeprazole (PRILOSEC) 20 MG capsule, Take 20 mg by mouth daily.   tamsulosin (FLOMAX) 0.4 MG CAPS capsule, TAKE 1 CAPSULE(0.4 MG) BY MOUTH DAILY   Reviewed prior external information including notes and imaging from  primary care provider As well as notes that were available from care everywhere and other healthcare systems.  Past medical history, social, surgical and family history all reviewed in electronic medical record.  No pertanent information unless stated regarding to the chief complaint.   Review of Systems:  No headache, visual changes, nausea, vomiting, diarrhea, constipation, dizziness, abdominal pain, skin rash, fevers, chills, night sweats, weight loss, swollen lymph nodes, body aches, chest pain, shortness of breath, mood changes. POSITIVE  muscle aches, joint swelling  Objective  Blood pressure 130/82, pulse (!) 53, height 6' (1.829 m), weight 255 lb (115.7 kg), SpO2 98 %.   General: No apparent distress alert and oriented x3 mood and affect normal, dressed appropriately.  HEENT: Pupils equal, extraocular movements intact  Respiratory: Patient's speak in full sentences and does not appear short of breath  Cardiovascular: Trace lower extremity edema, non tender, no erythema   Bilateral knees do have some instability bilaterally.  Lateral tracking of the patella.  Mild crepitus right greater than left.  Tender to palpation  over the medial joint space bilaterally.  After informed written and verbal consent, patient was seated on exam table. Right knee was prepped with alcohol swab and utilizing anterolateral approach, patient's right knee space was injected with 48 mg per 3 mL of Monovisc (sodium hyaluronate) in a prefilled syringe was injected easily into the knee through a 22-gauge needle..Patient tolerated the procedure well without immediate complications.  After informed written and verbal consent, patient was seated on exam table. Left knee was prepped with alcohol swab and utilizing anterolateral approach, patient's left knee space was injected with 40 mg per 3 mL of Monovisc (sodium hyaluronate) in a prefilled syringe was injected easily into the knee through a 22-gauge needle..Patient tolerated the procedure well without immediate complications.    Impression and Recommendations:    The above documentation has been reviewed and is accurate and complete Lyndal Pulley, DO

## 2022-10-01 ENCOUNTER — Ambulatory Visit (INDEPENDENT_AMBULATORY_CARE_PROVIDER_SITE_OTHER): Payer: Medicare Other | Admitting: Family Medicine

## 2022-10-01 VITALS — BP 130/82 | HR 53 | Ht 72.0 in | Wt 255.0 lb

## 2022-10-01 DIAGNOSIS — I2583 Coronary atherosclerosis due to lipid rich plaque: Secondary | ICD-10-CM

## 2022-10-01 DIAGNOSIS — I2510 Atherosclerotic heart disease of native coronary artery without angina pectoris: Secondary | ICD-10-CM | POA: Diagnosis not present

## 2022-10-01 DIAGNOSIS — M170 Bilateral primary osteoarthritis of knee: Secondary | ICD-10-CM | POA: Diagnosis not present

## 2022-10-01 MED ORDER — HYALURONAN 88 MG/4ML IX SOSY
88.0000 mg | PREFILLED_SYRINGE | Freq: Once | INTRA_ARTICULAR | Status: AC
  Administered 2022-10-01: 88 mg via INTRA_ARTICULAR

## 2022-10-01 NOTE — Patient Instructions (Signed)
Good to see you  Injection in left shoulder today Monovisc given in both knees Follow up in 2-3 months

## 2022-10-02 NOTE — Assessment & Plan Note (Signed)
Bilateral viscosupplementation given again today for this chronic exacerbation of underlying disease.  The patient does have worsening symptoms.  Encouraged him to continue to work on weight.  Discussed icing regimen and topical anti-inflammatories.  Continuing to stay active where possible.  Any worsening pain or giving out to see her sooner otherwise follow-up again in 3 months

## 2022-10-12 NOTE — Progress Notes (Unsigned)
[place] Phone: [phone] Subjective:   Jeffrey [name], am serving as a scribe for Dr. Hulan Saas.  I'm seeing this patient by the request  of:  Plotnikov, Evie Lacks, MD  CC: Left arm pain and weakness  Jeffrey [name]  Jeffrey [name] is a 70 y.o. male coming in with complaint of left arm pain and weakness.  Previously seen not long ago for bilateral knee pain.  Shoulder injected last visit. Had some soreness in shoulder after injection. Patient states that he pulled the car door shut on Sunday. Felt a pop and could not sleep due to pain. Having hard time flexing shoulder.       Past Medical History:  Diagnosis Date   Anxiety    Arthritis    knees   Blood transfusion without reported diagnosis    BPH (benign prostatic hypertrophy)    Cataract    "beginnings of"   Chronic kidney disease    kidney infection   Depression    GERD (gastroesophageal reflux disease)    Hypertension    Meralgia paresthetica of right side    SCCA (squamous cell carcinoma) of skin 11/05/2020   Left Thigh-Anterior (in situ) (curet and 5FU)   Past Surgical History:  Procedure Laterality Date   HERNIA REPAIR     inguinal, umbilical   SPLENECTOMY     Social History   Socioeconomic History   Marital status: Married    Spouse name: Not on file   Number of children: 2   Years of education: 12   Highest education level: 12th grade  Occupational History   Not on file  Tobacco Use   Smoking status: Never   Smokeless tobacco: Never  Vaping Use   Vaping Use: Never used  Substance and Sexual Activity   Alcohol use: Yes    Alcohol/week: 0.0 standard drinks of alcohol    Comment: occasional wine   Drug use: [name]   Sexual activity: Yes  Other Topics Concern   Not on file  Social History Narrative   Regular exercise-Yes (goes to the gym 3 times per week for 60 minutes)   Social Determinants of Health   Financial  Resource Strain: Low Risk  (12/31/2021)   Overall Financial Resource Strain (CARDIA)    Difficulty of Paying Living Expenses: Not hard at all  Food Insecurity: [name] Food Insecurity (12/31/2021)   Hunger Vital Sign    Worried About Running Out of Food in the Last Year: Never true    Ran Out of Food in the Last Year: Never true  Transportation Needs: [name] Transportation Needs (12/31/2021)   PRAPARE - Hydrologist (Medical): [name]    Lack of Transportation (Non-Medical): [name]  Physical Activity: Sufficiently Active (12/31/2021)   Exercise Vital Sign    Days of Exercise per Week: 3 days    Minutes of Exercise per Session: 60 min  Stress: [name] Stress Concern Present (12/31/2021)   [place]    Feeling of Stress : Not at all  Social Connections: [place] (12/31/2021)   Social Connection and Isolation Panel [NHANES]    Frequency of Communication with Friends and Family: More than three times a week    Frequency of Social Gatherings with Friends and Family: More than three times a week    Attends Religious Services: More than 4 times per year    Active  Member of Clubs or Organizations: Yes    Attends Music therapist: More than 4 times per year    Marital Status: Married   Allergies  Allergen Reactions   Bupropion Hcl     Unsure of reaction   Lovastatin     REACTION: myalgia   Statins     weak   Venlafaxine     Unsure of reaction   Family History  Problem Relation Age of Onset   Heart disease Mother 3       chf   Hypertension Other    Colon cancer Neg Hx    Esophageal cancer Neg Hx    Stomach cancer Neg Hx    Rectal cancer Neg Hx      Current Outpatient Medications (Cardiovascular):    rosuvastatin (CRESTOR) 5 MG tablet, Take 1 tablet (5 mg total) by mouth daily.   telmisartan (MICARDIS) 80 MG tablet, TAKE 1 TABLET(80 MG) BY MOUTH DAILY   triamterene-hydrochlorothiazide  (MAXZIDE-25) 37.5-25 MG tablet, TAKE 1 TABLET BY MOUTH DAILY   Current Outpatient Medications (Analgesics):    aspirin 81 MG tablet, Take 81 mg by mouth daily.   celecoxib (CELEBREX) 100 MG capsule, TAKE 1 CAPSULE(100 MG) BY MOUTH DAILY   Current Outpatient Medications (Other):    amoxicillin-clavulanate (AUGMENTIN) 875-125 MG tablet, Take 1 tablet by mouth 2 (two) times daily.   Ascorbic Acid (VITAMIN C) 100 MG tablet, Take 1 tablet by mouth daily.   Cholecalciferol (VITAMIN D3) 2000 units TABS, Take 1 tablet by mouth.   finasteride (PROSCAR) 5 MG tablet, Take 1 tablet (5 mg total) by mouth daily. Overdue for yearly physical w/labs must see MF for refills   Fluorouracil (TOLAK) 4 % CREA, Apply 1 application. topically daily. Qhs x 28 days   imiquimod (ALDARA) 5 % cream, Apply to thigh 3 nights a wk for 8 wks   Omega-3 Fatty Acids (FISH OIL) 1000 MG CAPS, Take 1 capsule by mouth daily.   omeprazole (PRILOSEC) 20 MG capsule, Take 20 mg by mouth daily.   tamsulosin (FLOMAX) 0.4 MG CAPS capsule, TAKE 1 CAPSULE(0.4 MG) BY MOUTH DAILY   Reviewed prior external information including notes and imaging from  primary care provider As well as notes that were available from care everywhere and other healthcare systems.  Past medical history, social, surgical and family history all reviewed in electronic medical record.  [name] pertanent information unless stated regarding to the chief complaint.   Review of Systems:  [name] headache, visual changes, nausea, vomiting, diarrhea, constipation, dizziness, abdominal pain, skin rash, fevers, chills, night sweats, weight loss, swollen lymph nodes, body aches, joint swelling, chest pain, shortness of breath, mood changes. POSITIVE muscle aches  Objective  Blood pressure 128/84, pulse 64, height 6' (1.829 m), weight 248 lb (112.5 kg), SpO2 98 %.   General: [name] apparent distress alert and oriented x3 mood and affect normal, dressed appropriately.  HEENT: Pupils  equal, extraocular movements intact  Respiratory: Patient's speak in full sentences and does not appear short of breath  Cardiovascular: [name] lower extremity edema, non tender, [name] erythema  Antalgic gait noted Left arm does show some weakness noted with 3 out of 5 strength of the rotator cuff.  Positive Speed and Jurgenson's noted.  Neurovascularly intact distally.  Neck exam has mild loss of lordosis.  Limited muscular skeletal ultrasound was performed and interpreted by Hulan Saas, M  Limited ultrasound of patient's shoulder shows that there is likely a near complete tear of the  bicep tendon proximally.  Does have hypoechoic changes in the area.  Supraspinatus difficult to assess secondary to hypoechoic changes but likely some potential retraction noted.  Patient has some mild arthritic changes of the acromioclavicular joint. Impression: Likely acute bicep and supraspinatus tear  Procedure: Real-time Ultrasound Guided Injection of left glenohumeral joint Device: GE Logiq E  Ultrasound guided injection is preferred based studies that show increased duration, increased effect, greater accuracy, decreased procedural pain, increased response rate with ultrasound guided versus blind injection.  Verbal informed consent obtained.  Time-out conducted.  Noted [name] overlying erythema, induration, or other signs of local infection.  Skin prepped in a sterile fashion.  Local anesthesia: Topical Ethyl chloride.  With sterile technique and under real time ultrasound guidance:  Joint visualized.  21g 2 inch needle inserted posterior approach. Pictures taken for needle placement. Patient did have injection of 2 cc of 0.5% Marcaine, and 1cc of Kenalog 40 mg/dL. Completed without difficulty  Pain immediately resolved suggesting accurate placement of the medication.  Advised to call if fevers/chills, erythema, induration, drainage, or persistent bleeding.   Impression: Technically successful ultrasound guided  injection.     Impression and Recommendations:    The above documentation has been reviewed and is accurate and complete Jeffrey Pulley, DO

## 2022-10-13 ENCOUNTER — Ambulatory Visit: Payer: Self-pay

## 2022-10-13 ENCOUNTER — Encounter: Payer: Self-pay | Admitting: Family Medicine

## 2022-10-13 ENCOUNTER — Ambulatory Visit (INDEPENDENT_AMBULATORY_CARE_PROVIDER_SITE_OTHER): Payer: Medicare Other

## 2022-10-13 ENCOUNTER — Ambulatory Visit (INDEPENDENT_AMBULATORY_CARE_PROVIDER_SITE_OTHER): Payer: Medicare Other | Admitting: Family Medicine

## 2022-10-13 VITALS — BP 128/84 | HR 64 | Ht 72.0 in | Wt 248.0 lb

## 2022-10-13 DIAGNOSIS — M79602 Pain in left arm: Secondary | ICD-10-CM

## 2022-10-13 DIAGNOSIS — I2510 Atherosclerotic heart disease of native coronary artery without angina pectoris: Secondary | ICD-10-CM | POA: Diagnosis not present

## 2022-10-13 DIAGNOSIS — M75102 Unspecified rotator cuff tear or rupture of left shoulder, not specified as traumatic: Secondary | ICD-10-CM | POA: Diagnosis not present

## 2022-10-13 DIAGNOSIS — M19012 Primary osteoarthritis, left shoulder: Secondary | ICD-10-CM | POA: Diagnosis not present

## 2022-10-13 DIAGNOSIS — I2583 Coronary atherosclerosis due to lipid rich plaque: Secondary | ICD-10-CM

## 2022-10-13 NOTE — Assessment & Plan Note (Signed)
Patient states that he did have an audible pop.  Difficult to assess secondary to some inflammation in the area but does appear to have a bicep and likely supraspinatus tear noted.  Patient has responded to formal physical therapy and conservative therapy for a rotator cuff on the contralateral side.  Due to the pain patient decided he would like an injection and was given 1 today.  Discussed potentially formal physical therapy or doing the home exercises on his own.  Patient is in agreement with this.  Follow-up with me again in 4 to 6 weeks otherwise.

## 2022-10-13 NOTE — Patient Instructions (Addendum)
Injected shoulder today Send me a message in one week to let me know if injection worked See me again in 4-6 weeks

## 2022-10-16 DIAGNOSIS — R972 Elevated prostate specific antigen [PSA]: Secondary | ICD-10-CM | POA: Diagnosis not present

## 2022-10-31 ENCOUNTER — Other Ambulatory Visit: Payer: Self-pay | Admitting: Internal Medicine

## 2022-12-07 ENCOUNTER — Other Ambulatory Visit: Payer: Self-pay | Admitting: Internal Medicine

## 2022-12-23 NOTE — Progress Notes (Unsigned)
[place] Phone: [phone] Subjective:   Jeffrey [name], am serving as a scribe for Dr. Hulan Saas. I'm seeing this patient by the request  of:  Plotnikov, Evie Lacks, MD  CC: bilateral knee pain   [id number]  10/13/2022 Patient states that he did have an audible pop.  Difficult to assess secondary to some inflammation in the area but does appear to have a bicep and likely supraspinatus tear noted.  Patient has responded to formal physical therapy and conservative therapy for a rotator cuff on the contralateral side.  Due to the pain patient decided he would like an injection and was given 1 today.  Discussed potentially formal physical therapy or doing the home exercises on his own.  Patient is in agreement with this.  Follow-up with me again in 4 to 6 weeks otherwise     Updated 12/24/2022 Jeffrey [name] is a 72 y.o. male coming in with complaint of L arm pain. Patient states that he is able to sleep without pain but is still having a lot of pain throughout the day and prior to sleeping.   B knee pain R>L. Pain worse at night and when walking on uneven surfaces. Feels like visco was helpful.     Past Medical History:  Diagnosis Date   Anxiety    Arthritis    knees   Blood transfusion without reported diagnosis    BPH (benign prostatic hypertrophy)    Cataract    "beginnings of"   Chronic kidney disease    kidney infection   Depression    GERD (gastroesophageal reflux disease)    Hypertension    Meralgia paresthetica of right side    SCCA (squamous cell carcinoma) of skin 11/05/2020   Left Thigh-Anterior (in situ) (curet and 5FU)   Past Surgical History:  Procedure Laterality Date   HERNIA REPAIR     inguinal, umbilical   SPLENECTOMY     Social History   Socioeconomic History   Marital status: Married    Spouse name: Not on file   Number of children: 2   Years of education: 12    Highest education level: 12th grade  Occupational History   Not on file  Tobacco Use   Smoking status: Never   Smokeless tobacco: Never  Vaping Use   Vaping Use: Never used  Substance and Sexual Activity   Alcohol use: Yes    Alcohol/week: 0.0 standard drinks of alcohol    Comment: occasional wine   Drug use: [name]   Sexual activity: Yes  Other Topics Concern   Not on file  Social History Narrative   Regular exercise-Yes (goes to the gym 3 times per week for 60 minutes)   Social Determinants of Health   Financial Resource Strain: Low Risk  (12/31/2021)   Overall Financial Resource Strain (CARDIA)    Difficulty of Paying Living Expenses: Not hard at all  Food Insecurity: [name] Food Insecurity (12/31/2021)   Hunger Vital Sign    Worried About Running Out of Food in the Last Year: Never true    Ran Out of Food in the Last Year: Never true  Transportation Needs: [name] Transportation Needs (12/31/2021)   PRAPARE - Hydrologist (Medical): [name]    Lack of Transportation (Non-Medical): [name]  Physical Activity: Sufficiently Active (12/31/2021)   Exercise Vital Sign    Days of Exercise per Week: 3 days  Minutes of Exercise per Session: 60 min  Stress: [name] Stress Concern Present (12/31/2021)   [place]    Feeling of Stress : Not at all  Social Connections: [place] (12/31/2021)   Social Connection and Isolation Panel [NHANES]    Frequency of Communication with Friends and Family: More than three times a week    Frequency of Social Gatherings with Friends and Family: More than three times a week    Attends Religious Services: More than 4 times per year    Active Member of [employer] or Organizations: Yes    Attends Music therapist: More than 4 times per year    Marital Status: Married   Allergies  Allergen Reactions   Bupropion Hcl     Unsure of reaction   Lovastatin     REACTION:  myalgia   Statins     weak   Venlafaxine     Unsure of reaction   Family History  Problem Relation Age of Onset   Heart disease Mother 29       chf   Hypertension Other    Colon cancer Neg Hx    Esophageal cancer Neg Hx    Stomach cancer Neg Hx    Rectal cancer Neg Hx      Current Outpatient Medications (Cardiovascular):    rosuvastatin (CRESTOR) 5 MG tablet, Take 1 tablet (5 mg total) by mouth daily.   telmisartan (MICARDIS) 80 MG tablet, TAKE 1 TABLET(80 MG) BY MOUTH DAILY   triamterene-hydrochlorothiazide (MAXZIDE-25) 37.5-25 MG tablet, TAKE 1 TABLET BY MOUTH DAILY   Current Outpatient Medications (Analgesics):    aspirin 81 MG tablet, Take 81 mg by mouth daily.   celecoxib (CELEBREX) 100 MG capsule, TAKE 1 CAPSULE(100 MG) BY MOUTH DAILY   Current Outpatient Medications (Other):    amoxicillin-clavulanate (AUGMENTIN) 875-125 MG tablet, Take 1 tablet by mouth 2 (two) times daily.   Ascorbic Acid (VITAMIN C) 100 MG tablet, Take 1 tablet by mouth daily.   Cholecalciferol (VITAMIN D3) 2000 units TABS, Take 1 tablet by mouth.   finasteride (PROSCAR) 5 MG tablet, Take 1 tablet (5 mg total) by mouth daily. Overdue for yearly physical w/labs must see MF for refills   Fluorouracil (TOLAK) 4 % CREA, Apply 1 application. topically daily. Qhs x 28 days   imiquimod (ALDARA) 5 % cream, Apply to thigh 3 nights a wk for 8 wks   Omega-3 Fatty Acids (FISH OIL) 1000 MG CAPS, Take 1 capsule by mouth daily.   omeprazole (PRILOSEC) 20 MG capsule, Take 20 mg by mouth daily.   tamsulosin (FLOMAX) 0.4 MG CAPS capsule, Take 1 capsule (0.4 mg total) by mouth daily. Annual appt due must see provider for future refills   Reviewed prior external information including notes and imaging from  primary care provider As well as notes that were available from care everywhere and other healthcare systems.  Past medical history, social, surgical and family history all reviewed in electronic medical record.   [name] pertanent information unless stated regarding to the chief complaint.   Review of Systems:  [name] headache, visual changes, nausea, vomiting, diarrhea, constipation, dizziness, abdominal pain, skin rash, fevers, chills, night sweats, weight loss, swollen lymph nodes,  joint swelling, chest pain, shortness of breath, mood changes. POSITIVE muscle aches, body aches   Objective  Blood pressure 124/76, pulse 67, height 6' (1.829 m), weight 252 lb (114.3 kg), SpO2 96 %.   General: [name] apparent  distress alert and oriented x3 mood and affect normal, dressed appropriately.  HEENT: Pupils equal, extraocular movements intact  Respiratory: Patient's speak in full sentences and does not appear short of breath  Cardiovascular: [name] lower extremity edema, non tender, [name] erythema  Bilateral knee pain does have arthritic changes noted bilaterally.  Trace effusion noted as well.  Crepitus noted with any range of motion  Left shoulder shows still has significant weakness noted.  Patient is favoring the rotator cuff fairly significantly.  After informed written and verbal consent, patient was seated on exam table. Right knee was prepped with alcohol swab and utilizing anterolateral approach, patient's right knee space was injected with 4:1  marcaine 0.5%: Kenalog '40mg'$ /dL. Patient tolerated the procedure well without immediate complications.  After informed written and verbal consent, patient was seated on exam table. Left knee was prepped with alcohol swab and utilizing anterolateral approach, patient's left knee space was injected with 4:1  marcaine 0.5%: Kenalog '40mg'$ /dL. Patient tolerated the procedure well without immediate complications.  Procedure: Real-time Ultrasound Guided Injection of left acromioclavicular joint Device: GE Logiq Q7 Ultrasound guided injection is preferred based studies that show increased duration, increased effect, greater accuracy, decreased procedural pain, increased response rate, and  decreased cost with ultrasound guided versus blind injection.  Verbal informed consent obtained.  Time-out conducted.  Noted [name] overlying erythema, induration, or other signs of local infection.  Skin prepped in a sterile fashion.  Local anesthesia: Topical Ethyl chloride.  With sterile technique and under real time ultrasound guidance: With a 25-gauge half inch needle injected with 0.5 cc of 0.5% Marcaine and 0.5 cc of Kenalog 40 mg/mL Completed without difficulty  Pain immediately resolved suggesting accurate placement of the medication.  Advised to call if fevers/chills, erythema, induration, drainage, or persistent bleeding.  Impression: Technically successful ultrasound guided injection.   Impression and Recommendations:     The above documentation has been reviewed and is accurate and complete Lyndal Pulley, DO

## 2022-12-24 ENCOUNTER — Ambulatory Visit: Payer: Self-pay

## 2022-12-24 ENCOUNTER — Ambulatory Visit (INDEPENDENT_AMBULATORY_CARE_PROVIDER_SITE_OTHER): Payer: Medicare Other | Admitting: Family Medicine

## 2022-12-24 ENCOUNTER — Encounter: Payer: Self-pay | Admitting: Family Medicine

## 2022-12-24 VITALS — BP 124/76 | HR 67 | Ht 72.0 in | Wt 252.0 lb

## 2022-12-24 DIAGNOSIS — M25512 Pain in left shoulder: Secondary | ICD-10-CM

## 2022-12-24 DIAGNOSIS — M75102 Unspecified rotator cuff tear or rupture of left shoulder, not specified as traumatic: Secondary | ICD-10-CM | POA: Diagnosis not present

## 2022-12-24 DIAGNOSIS — M19012 Primary osteoarthritis, left shoulder: Secondary | ICD-10-CM

## 2022-12-24 DIAGNOSIS — M19019 Primary osteoarthritis, unspecified shoulder: Secondary | ICD-10-CM | POA: Insufficient documentation

## 2022-12-24 DIAGNOSIS — M170 Bilateral primary osteoarthritis of knee: Secondary | ICD-10-CM

## 2022-12-24 NOTE — Patient Instructions (Addendum)
Injected AC jt and B knees See me again in May after the 19th If not better in 4 weeks with shoulder write [place] and we will get MRI

## 2022-12-24 NOTE — Assessment & Plan Note (Signed)
Bilateral injections given for this chronic worsening pain.  Still wants to avoid any surgical intervention.  Continues to work on a regular basis.  Cannot take time out of work at the moment.  Discussed with patient that we will continue to work on the home exercises, continue to work on weight loss.  Has responded to viscosupplementation as well previously.  Follow-up again in 6 to 8 weeks

## 2022-12-24 NOTE — Assessment & Plan Note (Signed)
Very concerned about the rotator cuff tear with patient continues to have weakness on exam.  Patient declined advanced imaging.  Would like to try formal physical therapy which patient did respond on the contralateral side.  Follow-up with me again in 6 to 8 weeks when he is done with physical therapy

## 2022-12-24 NOTE — Assessment & Plan Note (Signed)
Injection given, tolerated the procedure well, discussed icing regimen and home exercises, increase activity slowly.  Seen if this is playing any role in patient's left shoulder pain.  Patient will be doing physical therapy for the rotator cuff tear that is underlying this.

## 2023-01-05 ENCOUNTER — Other Ambulatory Visit: Payer: Self-pay | Admitting: Internal Medicine

## 2023-01-06 ENCOUNTER — Other Ambulatory Visit: Payer: Self-pay | Admitting: Internal Medicine

## 2023-01-12 NOTE — Therapy (Signed)
OUTPATIENT PHYSICAL THERAPY UPPER EXTREMITY EVALUATION   Patient Name: WARDIE WION MRN: [id number] DOB:[birth year], 72 y.o., male Today's Date: 01/13/2023  END OF SESSION:  PT End of Session - 01/13/23 1340     Visit Number 1    Number of Visits 12    Date for PT Re-Evaluation 03/10/23   8 weeks due to tight schedule   Authorization Type MCR    Progress Note Due on Visit 10    PT Start Time 1300    PT Stop Time 1340    PT Time Calculation (min) 40 min    Activity Tolerance Patient tolerated treatment well    Behavior During Therapy WFL for tasks assessed/performed             Past Medical History:  Diagnosis Date   Anxiety    Arthritis    knees   Blood transfusion without reported diagnosis    BPH (benign prostatic hypertrophy)    Cataract    "beginnings of"   Chronic kidney disease    kidney infection   Depression    GERD (gastroesophageal reflux disease)    Hypertension    Meralgia paresthetica of right side    SCCA (squamous cell carcinoma) of skin 11/05/2020   Left Thigh-Anterior (in situ) (curet and 5FU)   Past Surgical History:  Procedure Laterality Date   HERNIA REPAIR     inguinal, umbilical   SPLENECTOMY     Patient Active Problem List   Diagnosis Date Noted   Bilateral carotid bruits 01/13/2023   AC (acromioclavicular) arthritis 12/24/2022   Left rotator cuff tear 10/13/2022   Degenerative arthritis of knee 02/25/2021   Coronary artery disease due to lipid rich plaque 01/13/2021   Renal mass, right 12/17/2020   RLQ abdominal pain [id number]   Umbilical hernia [id number]   Neoplasm of uncertain behavior of skin 05/01/2020   Trigger finger of left hand 05/01/2020   Dyslipidemia 07/20/2019   Aortic atherosclerosis ([place]) 04/14/2018   Right rotator cuff tear 01/13/2018   Abdominal pain 03/18/2016   Cervical disc disorder with radiculopathy of cervical region 12/06/2015   Edema 12/06/2015   Lower back pain 07/04/2015   Injury of popliteal  region 12/07/2014   Right knee pain 11/13/2013   Pneumonia 12/21/2012   Fatigue 12/21/2012   Dry mouth 12/21/2012   Actinic keratoses 09/09/2012   Well adult exam 06/25/2011   History of splenectomy 06/25/2011   PSA elevation 06/25/2011   Ventral hernia 05/23/2010   Meralgia paresthetica 03/19/2009   Anxiety state 06/25/2007   ERECTILE DYSFUNCTION 06/25/2007   DEPRESSION 06/25/2007   Essential hypertension 06/25/2007   GERD 06/25/2007   BPH (benign prostatic hyperplasia) 06/25/2007    PCP: Cassandria Anger, MD   REFERRING PROVIDER: Lyndal Pulley, DO   REFERRING DIAG: [id number] (ICD-10-CM) - Acute pain of left shoulder   THERAPY DIAG:  Acute pain of left shoulder  Stiffness of left shoulder, not elsewhere classified  Cramp and spasm  Rationale for Evaluation and Treatment: Rehabilitation  ONSET DATE: December 2023  SUBJECTIVE:  SUBJECTIVE STATEMENT: Lifting a semi trailer door in Aug/Sep 2003 the shoulder started hurting. Then it was hurting with driving. He reached out to pull his door shut and heard it pop. Still has difficulty reaching his seatbelt and also reaching out at a drive through. Painful sleeping on it. Injections on 12/24/22 and 10/13/22  PERTINENT HISTORY: Scoliosis, HTN, OA  PAIN:  Are you having pain? Yes: NPRS scale: 2-4/10 Pain location: left shoulder and down to shoulder blade Pain description: sharp Aggravating factors: horizontal ABD, OH Relieving factors: Aldara cream  PRECAUTIONS: None  WEIGHT BEARING RESTRICTIONS: No  FALLS:  Has patient fallen in last 6 months? No  LIVING ENVIRONMENT: Lives with: lives with their spouse Lives in: House/apartment Stairs:  N/A Has following equipment at home:  N/A  OCCUPATION: Works part time as Librarian, academic at [employer]: get it as good as the right  NEXT MD VISIT: May   OBJECTIVE:   DIAGNOSTIC FINDINGS:  XR 10/14/22 IMPRESSION: 1. Mild glenohumeral and acromioclavicular osteoarthritis. 2. Likely old healed fracture of the left clavicle midshaft.  PATIENT SURVEYS :  Quick Dash  20.5 / 100 = 20.5 %  COGNITION: Overall cognitive status: Within functional limits for tasks assessed     SENSATION: WFL  POSTURE: Rounded shoulders, Fwd head, scoliosis resulting in depressed left shoulder and scap  UPPER EXTREMITY ROM:   A/P ROM Right eval Left eval  Shoulder flexion  180  Shoulder extension  FULL  Shoulder abduction  125/150  Shoulder adduction    Shoulder internal rotation  84/90  Shoulder external rotation  38/55  Elbow flexion  FULL  Elbow extension  FULL  (Blank rows = not tested)  UPPER EXTREMITY MMT:  MMT Right eval Left eval  Shoulder flexion  4-*  Shoulder extension  4+  Shoulder abduction  3+*  Shoulder adduction    Shoulder internal rotation  4  Shoulder external rotation  3+  Middle trapezius    Lower trapezius    Elbow flexion  5  Elbow extension  5  (Blank rows = not tested) * = PAIN (mild with ABD and ER)  SHOULDER SPECIAL TESTS: Rotator cuff assessment: Drop arm test: negative, Empty can test: negative, Full can test: positive , and Gerber lift off test: positive    JOINT MOBILITY TESTING:  WNL  PALPATION:  L pects, UT, subscap, lats, medial scapular mm.   TODAY'S TREATMENT:                                                                                                                                         DATE:  See pt ed  PATIENT EDUCATION: Education details: PT eval findings, anticipated POC, and initial HEP' Person educated: Patient Education method: Explanation, Demonstration, and Handouts Education comprehension: verbalized understanding and returned demonstration  HOME EXERCISE  PROGRAM: Access Code: [email or web address] URL:  [email or web address] Date: 01/13/2023 Prepared by: Almyra Free  Exercises - Isometric Shoulder Abduction - Arm Straight at Wall (Mirrored)  - 2 x daily - 7 x weekly - 1 sets - 5 reps - 10 sec  hold - Standing Isometric Shoulder External Rotation with Doorway (Mirrored)  - 2 x daily - 7 x weekly - 1 sets - 5 reps - 10 sec  hold - Single Arm Punch with Resistance  - 1 x daily - 7 x weekly - 1-3 sets - 10 reps - Single Arm Shoulder Extension with Resistance  - 1 x daily - 7 x weekly - 1-3 sets - 10 reps - Shoulder Internal Rotation with Resistance  - 1 x daily - 7 x weekly - 1-3 sets - 10 reps  ASSESSMENT:  CLINICAL IMPRESSION: [name] "Ronalee Belts" is a 72 y.o. male who was seen today for physical therapy evaluation and treatment for left shoulder pain. Ronalee Belts hurt his shoulder lifting a trailer door in Aug 2023. He has had two injections since which have helped,but he continues to have pain with horizontal ABD and ER mainly such as with washing hair and reaching out at a drive-thru window. He demonstrates significant weakness in L shoulder ER, but is weak in all planes. His ROM is limited primarily in ER and ABD, but also with standing flexion. He is left hand dominant. Ronalee Belts will benefit from skilled PT to address these deficits.      OBJECTIVE IMPAIRMENTS: decreased ROM, decreased strength, increased muscle spasms, impaired flexibility, impaired UE functional use, postural dysfunction, and pain.   ACTIVITY LIMITATIONS: carrying, lifting, and reach over head  PARTICIPATION LIMITATIONS: occupation  PERSONAL FACTORS: 3+ comorbidities: HTN, OA, scoliosis  are also affecting patient's functional outcome.   REHAB POTENTIAL: Good  CLINICAL DECISION MAKING: Evolving/moderate complexity  EVALUATION COMPLEXITY: Low  GOALS: Goals reviewed with patient? Yes  SHORT TERM GOALS: Target date: 01/27/2023   Patient will be independent with initial HEP.   Baseline:  Goal status: INITIAL    LONG TERM GOALS: Target date: 03/10/2023   Patient will be independent with advanced/ongoing HEP to improve outcomes and carryover.  Baseline:  Goal status: INITIAL  2.  Patient will report 75% improvement in L shoulder pain to improve QOL.  Baseline:  Goal status: INITIAL  3.  Patient to demonstrate improved upright posture with posterior shoulder girdle engaged to promote improved glenohumeral joint mobility. Baseline:  Goal status: INITIAL  4.  Patient to improve L shoulder AROM to [facility] without pain provocation to allow for increased ease of ADLs including washing hair and reaching out to drive-thru window.  Baseline: limited with ABD, ER and standing flex Goal status: INITIAL  5.  Patient will demonstrate improved UE strength to 4+/5. Baseline:  Goal status: INITIAL  6  Patient will report 10 on Quick Dash to demonstrate improved functional ability.  Baseline:  20.5 / 100 = 20.5 %  PLAN: PT FREQUENCY: 2x/week  PT DURATION:  12 sessions  PLANNED INTERVENTIONS: Therapeutic exercises, Therapeutic activity, Neuromuscular re-education, Patient/Family education, Self Care, Joint mobilization, Dry Needling, Electrical stimulation, Spinal mobilization, Cryotherapy, Moist heat, Taping, Ultrasound, Ionotophoresis 4mg /ml Dexamethasone, and Manual therapy  PLAN FOR NEXT SESSION: Review HEP and progress shoulder and scapular strength, chest opening,shoulder ROM   Norwood Quezada, PT 01/13/2023, 2:54 PM

## 2023-01-13 ENCOUNTER — Ambulatory Visit: Payer: Medicare Other | Attending: Family Medicine | Admitting: Physical Therapy

## 2023-01-13 ENCOUNTER — Encounter: Payer: Self-pay | Admitting: Internal Medicine

## 2023-01-13 ENCOUNTER — Encounter: Payer: Self-pay | Admitting: Physical Therapy

## 2023-01-13 ENCOUNTER — Other Ambulatory Visit: Payer: Self-pay

## 2023-01-13 ENCOUNTER — Ambulatory Visit (INDEPENDENT_AMBULATORY_CARE_PROVIDER_SITE_OTHER): Payer: Medicare Other | Admitting: Internal Medicine

## 2023-01-13 VITALS — BP 118/76 | HR 62 | Temp 98.0°F | Ht 72.0 in | Wt 245.0 lb

## 2023-01-13 DIAGNOSIS — Z0000 Encounter for general adult medical examination without abnormal findings: Secondary | ICD-10-CM | POA: Diagnosis not present

## 2023-01-13 DIAGNOSIS — R0989 Other specified symptoms and signs involving the circulatory and respiratory systems: Secondary | ICD-10-CM

## 2023-01-13 DIAGNOSIS — K429 Umbilical hernia without obstruction or gangrene: Secondary | ICD-10-CM | POA: Diagnosis not present

## 2023-01-13 DIAGNOSIS — I2510 Atherosclerotic heart disease of native coronary artery without angina pectoris: Secondary | ICD-10-CM

## 2023-01-13 DIAGNOSIS — Z23 Encounter for immunization: Secondary | ICD-10-CM | POA: Diagnosis not present

## 2023-01-13 DIAGNOSIS — M25612 Stiffness of left shoulder, not elsewhere classified: Secondary | ICD-10-CM | POA: Diagnosis not present

## 2023-01-13 DIAGNOSIS — I2583 Coronary atherosclerosis due to lipid rich plaque: Secondary | ICD-10-CM

## 2023-01-13 DIAGNOSIS — R252 Cramp and spasm: Secondary | ICD-10-CM | POA: Insufficient documentation

## 2023-01-13 DIAGNOSIS — M25512 Pain in left shoulder: Secondary | ICD-10-CM | POA: Insufficient documentation

## 2023-01-13 MED ORDER — TRIAMTERENE-HCTZ 37.5-25 MG PO TABS: 1.0000 | ORAL_TABLET | Freq: Every day | ORAL | 3 refills | Status: DC

## 2023-01-13 MED ORDER — FINASTERIDE 5 MG PO TABS: 5.0000 mg | ORAL_TABLET | Freq: Every day | ORAL | 3 refills | Status: AC

## 2023-01-13 MED ORDER — CELECOXIB 100 MG PO CAPS: ORAL_CAPSULE | ORAL | 1 refills | Status: DC

## 2023-01-13 MED ORDER — ROSUVASTATIN CALCIUM 5 MG PO TABS: 5.0000 mg | ORAL_TABLET | Freq: Every day | ORAL | 3 refills | Status: DC

## 2023-01-13 MED ORDER — TELMISARTAN 80 MG PO TABS: ORAL_TABLET | ORAL | 3 refills | Status: DC

## 2023-01-13 MED ORDER — TAMSULOSIN HCL 0.4 MG PO CAPS: 0.4000 mg | ORAL_CAPSULE | Freq: Every day | ORAL | 0 refills | Status: DC

## 2023-01-13 MED ORDER — B COMPLEX PLUS PO TABS: 1.0000 | ORAL_TABLET | Freq: Every day | ORAL | 3 refills | Status: AC

## 2023-01-13 NOTE — Assessment & Plan Note (Signed)
We discussed age appropriate health related issues, including available/recomended screening tests and vaccinations. Labs were ordered to be later reviewed . All questions were answered. We discussed one or more of the following - seat belt use, use of sunscreen/sun exposure exercise, fall risk reduction, second hand smoke exposure, firearm use and storage, seat belt use, a need for adhering to healthy diet and exercise. Labs were ordered.  All questions were answered. Colon nl 2016 dr Henrene Pastor

## 2023-01-13 NOTE — Assessment & Plan Note (Signed)
Worse Surg ref 

## 2023-01-13 NOTE — Addendum Note (Signed)
Addended by: Marijean Heath R on: 01/13/2023 10:20 AM   Modules accepted: Orders

## 2023-01-13 NOTE — Assessment & Plan Note (Signed)
Re-start Crestor 

## 2023-01-13 NOTE — Progress Notes (Signed)
Subjective:  Patient ID: Jeffrey [name], male    DOB: March 27, 1951  Age: 72 y.o. MRN: [id number]  CC: Annual Exam (PHYSICAL)   HPI Jeffrey [name] presents for a well exam C/o L shoulder pain - starting PT  Outpatient Medications Prior to Visit  Medication Sig Dispense Refill   aspirin 81 MG tablet Take 81 mg by mouth daily.     Cholecalciferol (VITAMIN D3) 2000 units TABS Take 1 tablet by mouth.     Fluorouracil (TOLAK) 4 % CREA Apply 1 application. topically daily. Qhs x 28 days 40 g 1   imiquimod (ALDARA) 5 % cream Apply to thigh 3 nights a wk for 8 wks 6 each 0   Omega-3 Fatty Acids (FISH OIL) 1000 MG CAPS Take 1 capsule by mouth daily.     omeprazole (PRILOSEC) 20 MG capsule Take 20 mg by mouth daily.     Ascorbic Acid (VITAMIN C) 100 MG tablet Take 1 tablet by mouth daily.     celecoxib (CELEBREX) 100 MG capsule TAKE 1 CAPSULE(100 MG) BY MOUTH DAILY 90 capsule 1   finasteride (PROSCAR) 5 MG tablet Take 1 tablet (5 mg total) by mouth daily. Overdue for yearly physical w/labs must see MF for refills 90 tablet 3   rosuvastatin (CRESTOR) 5 MG tablet Take 1 tablet (5 mg total) by mouth daily. 15 tablet 0   tamsulosin (FLOMAX) 0.4 MG CAPS capsule Take 1 capsule (0.4 mg total) by mouth daily. Overdue for Annual appt must see provider for future refills 30 capsule 0   telmisartan (MICARDIS) 80 MG tablet TAKE 1 TABLET(80 MG) BY MOUTH DAILY 90 tablet 3   triamterene-hydrochlorothiazide (MAXZIDE-25) 37.5-25 MG tablet TAKE 1 TABLET BY MOUTH DAILY 90 tablet 3   amoxicillin-clavulanate (AUGMENTIN) 875-125 MG tablet Take 1 tablet by mouth 2 (two) times daily. (Patient not taking: Reported on 01/13/2023) 20 tablet 0   No facility-administered medications prior to visit.    ROS: Review of Systems  Constitutional:  Negative for appetite change, fatigue and unexpected weight change.  HENT:  Negative for congestion, nosebleeds, sneezing, sore throat and trouble swallowing.   Eyes:  Negative for  itching and visual disturbance.  Respiratory:  Negative for cough.   Cardiovascular:  Negative for chest pain, palpitations and leg swelling.  Gastrointestinal:  Positive for abdominal distention. Negative for blood in stool, diarrhea and nausea.  Genitourinary:  Negative for frequency and hematuria.  Musculoskeletal:  Negative for back pain, gait problem, joint swelling and neck pain.  Skin:  Negative for rash.  Neurological:  Negative for dizziness, tremors, speech difficulty and weakness.  Psychiatric/Behavioral:  Negative for agitation, dysphoric mood and sleep disturbance. The patient is not nervous/anxious.     Objective:  BP 118/76 (BP Location: Left Arm, Patient Position: Sitting, Cuff Size: Large)   Pulse 62   Temp 98 F (36.7 C) (Oral)   Ht 6' (1.829 m)   Wt 245 lb (111.1 kg)   SpO2 97%   BMI 33.23 kg/m   BP Readings from Last 3 Encounters:  01/13/23 118/76  12/24/22 124/76  10/13/22 128/84    Wt Readings from Last 3 Encounters:  01/13/23 245 lb (111.1 kg)  12/24/22 252 lb (114.3 kg)  10/13/22 248 lb (112.5 kg)    Physical Exam Constitutional:      General: He is not in acute distress.    Appearance: He is well-developed. He is obese.     Comments: NAD  Eyes:  Conjunctiva/sclera: Conjunctivae normal.     Pupils: Pupils are equal, round, and reactive to light.  Neck:     Thyroid: No thyromegaly.     Vascular: No JVD.  Cardiovascular:     Rate and Rhythm: Normal rate and regular rhythm.     Heart sounds: Normal heart sounds. No murmur heard.    No friction rub. No gallop.  Pulmonary:     Effort: Pulmonary effort is normal. No respiratory distress.     Breath sounds: Normal breath sounds. No wheezing or rales.  Chest:     Chest wall: No tenderness.  Abdominal:     General: Bowel sounds are normal. There is distension.     Palpations: Abdomen is soft. There is no mass.     Tenderness: There is no abdominal tenderness. There is no guarding or rebound.      Hernia: A hernia is present.  Musculoskeletal:        General: No tenderness. Normal range of motion.     Cervical back: Normal range of motion.  Lymphadenopathy:     Cervical: No cervical adenopathy.  Skin:    General: Skin is warm and dry.     Findings: No rash.  Neurological:     Mental Status: He is alert and oriented to person, place, and time.     Cranial Nerves: No cranial nerve deficit.     Motor: No abnormal muscle tone.     Coordination: Coordination normal.     Gait: Gait normal.     Deep Tendon Reflexes: Reflexes are normal and symmetric.  Psychiatric:        Behavior: Behavior normal.        Thought Content: Thought content normal.        Judgment: Judgment normal.   Large abdomen Umbilical hernia - large, NT Mild bruit B  Lab Results  Component Value Date   WBC 8.7 12/12/2021   HGB 15.2 12/12/2021   HCT 45.9 12/12/2021   PLT 343.0 12/12/2021   GLUCOSE 95 12/12/2021   CHOL 138 12/12/2021   TRIG 80.0 12/12/2021   HDL 48.60 12/12/2021   LDLDIRECT 151.8 05/15/2010   LDLCALC 74 12/12/2021   ALT 14 12/12/2021   AST 17 12/12/2021   NA 140 12/12/2021   K 4.6 12/12/2021   CL 101 12/12/2021   CREATININE 1.12 12/12/2021   BUN 19 12/12/2021   CO2 36 (H) 12/12/2021   TSH 1.44 12/12/2021   PSA 6.10 (H) 12/12/2021   INR 0.93 08/12/2010    MR Abdomen W Wo Contrast  Result Date: 01/13/2021 CLINICAL DATA:  Complex right renal cyst identified by prior CT and ultrasound, right-sided abdominal pain, history of splenectomy EXAM: MRI ABDOMEN WITHOUT AND WITH CONTRAST TECHNIQUE: Multiplanar multisequence MR imaging of the abdomen was performed both before and after the administration of intravenous contrast. CONTRAST:  8mL MULTIHANCE GADOBENATE DIMEGLUMINE 529 MG/ML IV SOLN COMPARISON:  Renal ultrasound, 12/20/2020, CT abdomen pelvis, 12/02/2020 FINDINGS: Lower chest: No acute findings. Hepatobiliary: No mass or other parenchymal abnormality identified. Gallstones in  the dependent gallbladder (series 9, image 13). No biliary ductal dilatation. Pancreas: No mass, inflammatory changes, or other parenchymal abnormality identified. Spleen:  Surgically absent. Adrenals/Urinary Tract: No masses identified. There is a fluid signal, nonenhancing, exophytic cyst of the lateral midportion of the right kidney measuring 2.6 x 1.5 cm (series 13, image 44, series 3, image 19). There are additional subcentimeter cysts present bilaterally, including hemorrhagic and proteinaceous cysts. No evidence of hydronephrosis.  Stomach/Bowel: Visualized portions within the abdomen are unremarkable. Vascular/Lymphatic: No pathologically enlarged lymph nodes identified. No abdominal aortic aneurysm demonstrated. Other:  None. Musculoskeletal: No suspicious bone lesions identified. IMPRESSION: 1. There is a fluid signal, nonenhancing, exophytic cyst of the lateral midportion of the right kidney measuring 2.6 x 1.5 cm, in keeping with findings of prior examinations. No further follow-up or characterization is required for this benign cyst. There are additional subcentimeter cysts present bilaterally, including hemorrhagic and proteinaceous cysts. No evidence of hydronephrosis. 2. Cholelithiasis. 3. Status post splenectomy. Electronically Signed   By: Eddie Candle M.D.   On: 01/13/2021 08:20    Assessment & Plan:   Problem List Items Addressed This Visit       Cardiovascular and Mediastinum   Coronary artery disease due to lipid rich plaque    Restart Crestor      Relevant Medications   telmisartan (MICARDIS) 80 MG tablet   triamterene-hydrochlorothiazide (MAXZIDE-25) 37.5-25 MG tablet   rosuvastatin (CRESTOR) 5 MG tablet     Other   Well adult exam - Primary    We discussed age appropriate health related issues, including available/recomended screening tests and vaccinations. Labs were ordered to be later reviewed . All questions were answered. We discussed one or more of the following -  seat belt use, use of sunscreen/sun exposure exercise, fall risk reduction, second hand smoke exposure, firearm use and storage, seat belt use, a need for adhering to healthy diet and exercise. Labs were ordered.  All questions were answered. Colon nl 2016 dr Henrene Pastor      Relevant Orders   TSH   Urinalysis   CBC with Differential/Platelet   Lipid panel   Comprehensive metabolic panel   PSA   Umbilical hernia    Worse Surg ref      Relevant Orders   Ambulatory referral to General Surgery   Bilateral carotid bruits   Relevant Orders   US Carotid Bilateral      Meds ordered this encounter  Medications   celecoxib (CELEBREX) 100 MG capsule    Sig: TAKE 1 CAPSULE(100 MG) BY MOUTH DAILY    Dispense:  90 capsule    Refill:  1   finasteride (PROSCAR) 5 MG tablet    Sig: Take 1 tablet (5 mg total) by mouth daily. Overdue for yearly physical w/labs must see MF for refills    Dispense:  90 tablet    Refill:  3   tamsulosin (FLOMAX) 0.4 MG CAPS capsule    Sig: Take 1 capsule (0.4 mg total) by mouth daily. Overdue for Annual appt must see provider for future refills    Dispense:  30 capsule    Refill:  0    No 90 day overdue for appt   telmisartan (MICARDIS) 80 MG tablet    Sig: TAKE 1 TABLET 80MG  BY MOUTH DAILY    Dispense:  90 tablet    Refill:  3   triamterene-hydrochlorothiazide (MAXZIDE-25) 37.5-25 MG tablet    Sig: Take 1 tablet by mouth daily.    Dispense:  90 tablet    Refill:  3   rosuvastatin (CRESTOR) 5 MG tablet    Sig: Take 1 tablet (5 mg total) by mouth daily.    Dispense:  90 tablet    Refill:  3    Please call our office to schedule an overdue appointment with Dr. Johnsie Cancel before anymore refills. [phone]. Thank you 2nd attempt   B Complex-Folic Acid (B COMPLEX PLUS) TABS  Sig: Take 1 tablet by mouth daily.    Dispense:  100 tablet    Refill:  3      Follow-up: Return in about 6 months (around 07/16/2023) for a follow-up visit.  Walker Kehr, MD

## 2023-01-18 NOTE — Therapy (Signed)
OUTPATIENT PHYSICAL THERAPY UPPER EXTREMITY EVALUATION   Patient Name: SIDY CURTNER MRN: [id number] DOB:[birth year], 72 y.o., male Today's Date: 01/20/2023  END OF SESSION:  PT End of Session - 01/20/23 1300     Visit Number 2    Number of Visits 12    Date for PT Re-Evaluation 03/10/23    Authorization Type MCR    Progress Note Due on Visit 10    PT Start Time 1300    PT Stop Time [id number]    PT Time Calculation (min) 45 min    Activity Tolerance Patient tolerated treatment well    Behavior During Therapy WFL for tasks assessed/performed              Past Medical History:  Diagnosis Date   Anxiety    Arthritis    knees   Blood transfusion without reported diagnosis    BPH (benign prostatic hypertrophy)    Cataract    "beginnings of"   Chronic kidney disease    kidney infection   Depression    GERD (gastroesophageal reflux disease)    Hypertension    Meralgia paresthetica of right side    SCCA (squamous cell carcinoma) of skin 11/05/2020   Left Thigh-Anterior (in situ) (curet and 5FU)   Past Surgical History:  Procedure Laterality Date   HERNIA REPAIR     inguinal, umbilical   SPLENECTOMY     Patient Active Problem List   Diagnosis Date Noted   Bilateral carotid bruits 01/13/2023   AC (acromioclavicular) arthritis 12/24/2022   Left rotator cuff tear 10/13/2022   Degenerative arthritis of knee 02/25/2021   Coronary artery disease due to lipid rich plaque 01/13/2021   Renal mass, right 12/17/2020   RLQ abdominal pain [id number]   Umbilical hernia [id number]   Neoplasm of uncertain behavior of skin 05/01/2020   Trigger finger of left hand 05/01/2020   Dyslipidemia 07/20/2019   Aortic atherosclerosis ([place]) 04/14/2018   Right rotator cuff tear 01/13/2018   Abdominal pain 03/18/2016   Cervical disc disorder with radiculopathy of cervical region 12/06/2015   Edema 12/06/2015   Lower back pain 07/04/2015   Injury of popliteal region 12/07/2014   Right knee  pain 11/13/2013   Pneumonia 12/21/2012   Fatigue 12/21/2012   Dry mouth 12/21/2012   Actinic keratoses 09/09/2012   Well adult exam 06/25/2011   History of splenectomy 06/25/2011   PSA elevation 06/25/2011   Ventral hernia 05/23/2010   Meralgia paresthetica 03/19/2009   Anxiety state 06/25/2007   ERECTILE DYSFUNCTION 06/25/2007   DEPRESSION 06/25/2007   Essential hypertension 06/25/2007   GERD 06/25/2007   BPH (benign prostatic hyperplasia) 06/25/2007    PCP: Cassandria Anger, MD   REFERRING PROVIDER: Lyndal Pulley, DO   REFERRING DIAG: [phone] (ICD-10-CM) - Acute pain of left shoulder   THERAPY DIAG:  Acute pain of left shoulder  Stiffness of left shoulder, not elsewhere classified  Cramp and spasm  Rationale for Evaluation and Treatment: Rehabilitation  ONSET DATE: December 2023  SUBJECTIVE:  SUBJECTIVE STATEMENT: May have overdone it last night with the exercise   PERTINENT HISTORY: Scoliosis, HTN, OA  PAIN:  Are you having pain? Yes: NPRS scale: 0/10 Pain location: left shoulder and down to shoulder blade Pain description: sharp Aggravating factors: horizontal ABD, OH Relieving factors: Aldara cream  PRECAUTIONS: None  WEIGHT BEARING RESTRICTIONS: No  FALLS:  Has patient fallen in last 6 months? 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 Mild glenohumeral and acromioclavicular osteoarthritis. 2. Likely old healed fracture of the left clavicle  midshaft.  PATIENT SURVEYS :  Quick Dash  20.5 / 100 = 20.5 %  COGNITION: Overall cognitive status: Within functional limits for tasks assessed     SENSATION: WFL  POSTURE: Rounded shoulders, Fwd head, scoliosis resulting in depressed left shoulder and scap  UPPER EXTREMITY ROM:   A/P ROM Right eval Left eval  Shoulder flexion  180  Shoulder extension  FULL  Shoulder abduction  125/150  Shoulder adduction    Shoulder internal rotation  84/90  Shoulder external rotation  38/55  Elbow flexion  FULL  Elbow extension  FULL  (Blank rows = not tested)  UPPER EXTREMITY MMT:  MMT Right eval Left eval  Shoulder flexion  4-*  Shoulder extension  4+  Shoulder abduction  3+*  Shoulder adduction    Shoulder internal rotation  4  Shoulder external rotation  3+  Middle trapezius    Lower trapezius    Elbow flexion  5  Elbow extension  5  (Blank rows = not tested) * = PAIN (mild with ABD and ER)  SHOULDER SPECIAL TESTS: Rotator cuff assessment: Drop arm test: negative, Empty can test: negative, Full can test: positive , and Gerber lift off test: positive    JOINT MOBILITY TESTING:  WNL  PALPATION:  L pects, UT, subscap, lats, medial scapular mm.   TODAY'S TREATMENT:                                                                                                                                         DATE:  01/20/23 Pulleys ABD x 2 min Standing: L shoulder Isometrics ABD straight arm and ER 10 sec hold x 5 ea L punch RTB x 20 L shoulder IR RTB x 20 L shoulder ext RTB x 20 Horizontal ABD at wall to fatigue RTB Flex/Scaption/ABD to shoulder height thumbs up x 10 ea L QL stretch in doorway x 30 sec  Supine: hor ABD RTB x 10 (better than standing) ER RTB x 10 B ER 2# 2x10 B SDLY: ER x  10 Horizontal ABD x 10 ABD thumb up x 10  BATCA Low Rows 20# x 10, 25# x 10 High rows 25# 2x10   01/18/23 Eval: See pt ed  PATIENT EDUCATION: Education details: HEP  update' Person educated: Patient Education method: Explanation, Demonstration, and Handouts Education comprehension: verbalized understanding and returned demonstration  HOME EXERCISE PROGRAM: Access Code: [email or web address] URL: [email or web address] Date: 01/20/2023 Prepared by: Almyra Free  Exercises - Isometric Shoulder Abduction - Arm Straight at Wall (Mirrored)  - 2 x daily - 7 x weekly - 1 sets - 5 reps - 10 sec  hold - Standing Isometric Shoulder External Rotation with Doorway (Mirrored)  - 2 x daily - 7 x weekly - 1 sets - 5 reps - 10 sec  hold - Single Arm Punch with Resistance  - 1 x daily - 7 x weekly - 1-3 sets - 10 reps - Single Arm Shoulder Extension with Resistance  - 1 x daily - 7 x weekly - 1-3 sets - 10 reps - Shoulder Internal Rotation with Resistance  - 1 x daily - 7 x weekly - 1-3 sets - 10 reps - Supine Shoulder Horizontal Abduction with Resistance  - 1 x daily - 3 x weekly - 3 sets - 10 reps - Sidelying Shoulder External Rotation  - 1 x daily - 3 x weekly - 3 sets - 10 reps - Sidelying Shoulder Horizontal Abduction  - 1 x daily - 3 x weekly - 1-3 sets - 10 reps - Standing Shoulder Flexion to 90 Degrees  - 1 x daily - 3 x weekly - 1-3 sets - 10 reps - Standing Shoulder Scaption  - 1 x daily - 3 x weekly - 1-3 sets - 10 reps - Shoulder Abduction - Thumbs Up  - 1 x daily - 3 x weekly - 1-3 sets - 10 reps  DO LYING ON YOUR BACK   Robber: Scapular Retraction: Elbow Flexion (Standing)   With elbows bent to 90, pinch shoulder blades together and rotate arms out, keeping elbows bent. Repeat _10___ times per set. Do _1-3___ sets per session. Do __1__ sessions per day. May use __2___ pound weights.    ASSESSMENT:  CLINICAL IMPRESSION: Ronalee Belts did very well with TE today. He fatigues fairly easily with standing shoulder exercises and has less pain with supine and SDLY. HEP progressed with quite a few exercises today (pt advised to do every other day). These should be  reviewed next visit. Ronalee Belts continues to demonstrate potential for improvement and would benefit from continued skilled therapy to address impairments.       OBJECTIVE IMPAIRMENTS: decreased ROM, decreased strength, increased muscle spasms, impaired flexibility, impaired UE functional use, postural dysfunction, and pain.   ACTIVITY LIMITATIONS: carrying, lifting, and reach over head  PARTICIPATION LIMITATIONS: occupation  PERSONAL FACTORS: 3+ comorbidities: HTN, OA, scoliosis  are also affecting patient's functional outcome.   REHAB POTENTIAL: Good  CLINICAL DECISION MAKING: Evolving/moderate complexity  EVALUATION COMPLEXITY: Low  GOALS: Goals reviewed with patient? Yes  SHORT TERM GOALS: Target date: 01/27/2023   Patient will be independent with initial HEP.  Baseline:  Goal status: INITIAL    LONG TERM GOALS: Target date: 03/10/2023   Patient will be independent with advanced/ongoing HEP to improve outcomes and carryover.  Baseline:  Goal status: INITIAL  2.  Patient will report 75% improvement in L shoulder pain to improve QOL.  Baseline:  Goal status: INITIAL  3.  Patient to demonstrate improved upright posture with posterior shoulder girdle  engaged to promote improved glenohumeral joint mobility. Baseline:  Goal status: INITIAL  4.  Patient to improve L shoulder AROM to [facility] without pain provocation to allow for increased ease of ADLs including washing hair and reaching out to drive-thru window.  Baseline: limited with ABD, ER and standing flex Goal status: INITIAL  5.  Patient will demonstrate improved UE strength to 4+/5. Baseline:  Goal status: INITIAL  6  Patient will report 10 on Quick Dash to demonstrate improved functional ability.  Baseline:  20.5 / 100 = 20.5 %  PLAN: PT FREQUENCY: 2x/week  PT DURATION:  12 sessions  PLANNED INTERVENTIONS: Therapeutic exercises, Therapeutic activity, Neuromuscular re-education, Patient/Family education, Self Care,  Joint mobilization, Dry Needling, Electrical stimulation, Spinal mobilization, Cryotherapy, Moist heat, Taping, Ultrasound, Ionotophoresis 4mg /ml Dexamethasone, and Manual therapy  PLAN FOR NEXT SESSION: Review HEP, shoulder and scapular strength, chest opening,shoulder ROM   Esa Raden, PT 01/20/2023, 1:52 PM

## 2023-01-20 ENCOUNTER — Ambulatory Visit: Payer: Medicare Other | Admitting: Physical Therapy

## 2023-01-20 ENCOUNTER — Encounter: Payer: Self-pay | Admitting: Physical Therapy

## 2023-01-20 DIAGNOSIS — R252 Cramp and spasm: Secondary | ICD-10-CM

## 2023-01-20 DIAGNOSIS — M25612 Stiffness of left shoulder, not elsewhere classified: Secondary | ICD-10-CM

## 2023-01-20 DIAGNOSIS — M25512 Pain in left shoulder: Secondary | ICD-10-CM | POA: Diagnosis not present

## 2023-01-20 NOTE — Patient Instructions (Signed)
DO LYING ON YOUR BACK  Robber: Scapular Retraction: Elbow Flexion (Standing)   With elbows bent to 90, pinch shoulder blades together and rotate arms out, keeping elbows bent. Repeat _10___ times per set. Do _1-3___ sets per session. Do __1__ sessions per day. May use __2___ pound weights.  http://orth.exer.us/948   Copyright  VHI. All rights reserved.   Madelyn Flavors, PT 02/20/2015 10:18 AM  [facility] [address] [place],  [address]  [phone] (office) [phone] (fax)

## 2023-01-24 NOTE — Therapy (Signed)
OUTPATIENT PHYSICAL THERAPY UPPER EXTREMITY TREATMENT   Patient Name: TAKU PEPIN MRN: [id number] DOB:1951-02-06, 72 y.o., male Today's Date: 01/25/2023  END OF SESSION:  PT End of Session - 01/25/23 1258     Visit Number 3    Number of Visits 12    Date for PT Re-Evaluation 03/10/23    Authorization Type MCR    Progress Note Due on Visit 10    PT Start Time 1300    PT Stop Time 1343    PT Time Calculation (min) 43 min    Activity Tolerance Patient tolerated treatment well    Behavior During Therapy WFL for tasks assessed/performed              Past Medical History:  Diagnosis Date   Anxiety    Arthritis    knees   Blood transfusion without reported diagnosis    BPH (benign prostatic hypertrophy)    Cataract    "beginnings of"   Chronic kidney disease    kidney infection   Depression    GERD (gastroesophageal reflux disease)    Hypertension    Meralgia paresthetica of right side    SCCA (squamous cell carcinoma) of skin 11/05/2020   Left Thigh-Anterior (in situ) (curet and 5FU)   Past Surgical History:  Procedure Laterality Date   HERNIA REPAIR     inguinal, umbilical   SPLENECTOMY     Patient Active Problem List   Diagnosis Date Noted   Bilateral carotid bruits 01/13/2023   AC (acromioclavicular) arthritis 12/24/2022   Left rotator cuff tear 10/13/2022   Degenerative arthritis of knee 02/25/2021   Coronary artery disease due to lipid rich plaque 01/13/2021   Renal mass, right 12/17/2020   RLQ abdominal pain 11/08/2020   Umbilical hernia 11/08/2020   Neoplasm of uncertain behavior of skin 05/01/2020   Trigger finger of left hand 05/01/2020   Dyslipidemia 07/20/2019   Aortic atherosclerosis 04/14/2018   Right rotator cuff tear 01/13/2018   Abdominal pain 03/18/2016   Cervical disc disorder with radiculopathy of cervical region 12/06/2015   Edema 12/06/2015   Lower back pain 07/04/2015   Injury of popliteal region 12/07/2014   Right knee pain  11/13/2013   Pneumonia 12/21/2012   Fatigue 12/21/2012   Dry mouth 12/21/2012   Actinic keratoses 09/09/2012   Well adult exam 06/25/2011   History of splenectomy 06/25/2011   PSA elevation 06/25/2011   Ventral hernia 05/23/2010   Meralgia paresthetica 03/19/2009   Anxiety state 06/25/2007   ERECTILE DYSFUNCTION 06/25/2007   DEPRESSION 06/25/2007   Essential hypertension 06/25/2007   GERD 06/25/2007   BPH (benign prostatic hyperplasia) 06/25/2007    PCP: Tresa Garter, MD   REFERRING PROVIDER: Judi Saa, DO   REFERRING DIAG: [phone] (ICD-10-CM) - Acute pain of left shoulder   THERAPY DIAG:  Acute pain of left shoulder  Stiffness of left shoulder, not elsewhere classified  Cramp and spasm  Rationale for Evaluation and Treatment: Rehabilitation  ONSET DATE: December 2023  SUBJECTIVE:  SUBJECTIVE STATEMENT: I did the exercises twice this weekend, but that's about it. Just a little sore after.  PERTINENT HISTORY: Scoliosis, HTN, OA  PAIN:  Are you having pain? Yes: NPRS scale: 0/10 Pain location: left shoulder and down to shoulder blade Pain description: sharp Aggravating factors: horizontal ABD, OH Relieving factors: Aldara cream  PRECAUTIONS: None  WEIGHT BEARING RESTRICTIONS: No  FALLS:  Has patient fallen in last 6 months? No  LIVING ENVIRONMENT: Lives with: lives with their spouse Lives in: House/apartment Stairs:  N/A Has following equipment at home:  N/A  OCCUPATION: Works part time as Merchandiser, retail at [employer]  PLOF: Independent  PATIENT GOALS: get it as good as the right  NEXT MD VISIT: May   OBJECTIVE:   DIAGNOSTIC FINDINGS:  XR 10/14/22 IMPRESSION: 1. Mild glenohumeral and acromioclavicular osteoarthritis. 2. Likely old healed  fracture of the left clavicle midshaft.  PATIENT SURVEYS :  Quick Dash  20.5 / 100 = 20.5 %  COGNITION: Overall cognitive status: Within functional limits for tasks assessed     SENSATION: WFL  POSTURE: Rounded shoulders, Fwd head, scoliosis resulting in depressed left shoulder and scap  UPPER EXTREMITY ROM:   A/P ROM Right eval Left eval Left  01/25/23  Shoulder flexion  180   Shoulder extension  FULL   Shoulder abduction  125/150 150/180  Shoulder adduction     Shoulder internal rotation  84/90 80  Shoulder external rotation  38/55 75  Elbow flexion  FULL   Elbow extension  FULL   (Blank rows = not tested)  UPPER EXTREMITY MMT:  MMT Right eval Left eval  Shoulder flexion  4-*  Shoulder extension  4+  Shoulder abduction  3+*  Shoulder adduction    Shoulder internal rotation  4  Shoulder external rotation  3+  Middle trapezius    Lower trapezius    Elbow flexion  5  Elbow extension  5  (Blank rows = not tested) * = PAIN (mild with ABD and ER)  SHOULDER SPECIAL TESTS: Rotator cuff assessment: Drop arm test: negative, Empty can test: negative, Full can test: positive , and Gerber lift off test: positive    JOINT MOBILITY TESTING:  WNL  PALPATION:  L pects, UT, subscap, lats, medial scapular mm.   TODAY'S TREATMENT:                                                                                                                                         DATE:  01/25/23 UBE L4 x 6 min Supine: hor ABD RTB x 10  ER RTB x 10 then L only x 10 B towel under L arm ER /IR "W" position 2# 2x10 B ER at (almost) 90 deg ABD 2# 2 x 10 L only L serratus push 2# x 10 some discomfort SDLY : L ER 2 x 10, 1x 5 towel under arm L  Horizontal ABD 2 x 10 L ABD thumb up 2x 10 Standing: L punch RTB x 20 L shoulder IR RTB x 20 L shoulder ext RTB x 10, GTB x 10 Flex/Scaption/ABD to shoulder height thumbs up x 10 ea 1# for Flex and ABD/no wt scaption  BATCA Low Rows 20# 2x  10 High rows 20# 2x10  Manual Therapy: to decrease muscle spasm and pain and improve mobility Assessment of tissue tension - significantly less than at eval ROM measured - see objective   01/20/23 Pulleys ABD x 2 min Standing: L shoulder Isometrics ABD straight arm and ER 10 sec hold x 5 ea L punch RTB x 20 L shoulder IR RTB x 20 L shoulder ext RTB x 20 Horizontal ABD at wall to fatigue RTB Flex/Scaption/ABD to shoulder height thumbs up x 10 ea L QL stretch in doorway x 30 sec  Supine: hor ABD RTB x 10 (better than standing) ER RTB x 10 B ER 2# 2x10 B SDLY: ER x 10 Horizontal ABD x 10 ABD thumb up x 10  BATCA Low Rows 20# x 10, 25# x 10 High rows 25# 2x10   01/18/23 Eval: See pt ed  PATIENT EDUCATION: Education details: HEP update' Person educated: Patient Education method: Explanation, Demonstration, and Handouts Education comprehension: verbalized understanding and returned demonstration  HOME EXERCISE PROGRAM: Access Code: [email or web address] URL: [email or web address] Date: 01/20/2023 Prepared by: Raynelle Fanning  Exercises - Isometric Shoulder Abduction - Arm Straight at Wall (Mirrored)  - 2 x daily - 7 x weekly - 1 sets - 5 reps - 10 sec  hold - Standing Isometric Shoulder External Rotation with Doorway (Mirrored)  - 2 x daily - 7 x weekly - 1 sets - 5 reps - 10 sec  hold - Single Arm Punch with Resistance  - 1 x daily - 7 x weekly - 1-3 sets - 10 reps - Single Arm Shoulder Extension with Resistance  - 1 x daily - 7 x weekly - 1-3 sets - 10 reps - Shoulder Internal Rotation with Resistance  - 1 x daily - 7 x weekly - 1-3 sets - 10 reps - Supine Shoulder Horizontal Abduction with Resistance  - 1 x daily - 3 x weekly - 3 sets - 10 reps - Sidelying Shoulder External Rotation  - 1 x daily - 3 x weekly - 3 sets - 10 reps - Sidelying Shoulder Horizontal Abduction  - 1 x daily - 3 x weekly - 1-3 sets - 10 reps - Standing Shoulder Flexion to 90 Degrees  - 1 x daily - 3 x  weekly - 1-3 sets - 10 reps - Standing Shoulder Scaption  - 1 x daily - 3 x weekly - 1-3 sets - 10 reps - Shoulder Abduction - Thumbs Up  - 1 x daily - 3 x weekly - 1-3 sets - 10 reps  DO LYING ON YOUR BACK   Robber: Scapular Retraction: Elbow Flexion (Standing)   With elbows bent to 90, pinch shoulder blades together and rotate arms out, keeping elbows bent. Repeat _10___ times per set. Do _1-3___ sets per session. Do __1__ sessions per day. May use __2___ pound weights.    ASSESSMENT:  CLINICAL IMPRESSION: Kathlene November demonstrates significant improvement in active L shoulder ER and ABD to WNL. He continues to have the most difficulty with scaption and SDLY ER. He is reporting mostly soreness vs. Pain. He is progressing well toward his goals. Kathlene November continues to demonstrate potential for improvement and would benefit from  continued skilled therapy to address impairments.     OBJECTIVE IMPAIRMENTS: decreased ROM, decreased strength, increased muscle spasms, impaired flexibility, impaired UE functional use, postural dysfunction, and pain.   ACTIVITY LIMITATIONS: carrying, lifting, and reach over head  PARTICIPATION LIMITATIONS: occupation  PERSONAL FACTORS: 3+ comorbidities: HTN, OA, scoliosis  are also affecting patient's functional outcome.   REHAB POTENTIAL: Good  CLINICAL DECISION MAKING: Evolving/moderate complexity  EVALUATION COMPLEXITY: Low  GOALS: Goals reviewed with patient? Yes  SHORT TERM GOALS: Target date: 01/27/2023   Patient will be independent with initial HEP.  Baseline:  Goal status: MET    LONG TERM GOALS: Target date: 03/10/2023   Patient will be independent with advanced/ongoing HEP to improve outcomes and carryover.  Baseline:  Goal status: INITIAL  2.  Patient will report 75% improvement in L shoulder pain to improve QOL.  Baseline:  Goal status: INITIAL  3.  Patient to demonstrate improved upright posture with posterior shoulder girdle engaged to  promote improved glenohumeral joint mobility. Baseline:  Goal status: INITIAL  4.  Patient to improve L shoulder AROM to [facility] without pain provocation to allow for increased ease of ADLs including washing hair and reaching out to drive-thru window.  Baseline: limited with ABD, ER and standing flex Goal status: INITIAL  5.  Patient will demonstrate improved UE strength to 4+/5. Baseline:  Goal status: INITIAL  6  Patient will report 10 on Quick Dash to demonstrate improved functional ability.  Baseline:  20.5 / 100 = 20.5 %  PLAN: PT FREQUENCY: 2x/week  PT DURATION:  12 sessions  PLANNED INTERVENTIONS: Therapeutic exercises, Therapeutic activity, Neuromuscular re-education, Patient/Family education, Self Care, Joint mobilization, Dry Needling, Electrical stimulation, Spinal mobilization, Cryotherapy, Moist heat, Taping, Ultrasound, Ionotophoresis 4mg /ml Dexamethasone, and Manual therapy  PLAN FOR NEXT SESSION: continue shoulder and scapular strength, chest opening,shoulder ROM   Terica Yogi, PT 01/25/2023, 1:53 PM

## 2023-01-25 ENCOUNTER — Encounter: Payer: Self-pay | Admitting: Physical Therapy

## 2023-01-25 ENCOUNTER — Ambulatory Visit: Payer: Medicare Other | Attending: Family Medicine | Admitting: Physical Therapy

## 2023-01-25 DIAGNOSIS — M25512 Pain in left shoulder: Secondary | ICD-10-CM | POA: Insufficient documentation

## 2023-01-25 DIAGNOSIS — R252 Cramp and spasm: Secondary | ICD-10-CM | POA: Diagnosis not present

## 2023-01-25 DIAGNOSIS — M25612 Stiffness of left shoulder, not elsewhere classified: Secondary | ICD-10-CM | POA: Insufficient documentation

## 2023-01-28 NOTE — Therapy (Signed)
OUTPATIENT PHYSICAL THERAPY UPPER EXTREMITY TREATMENT   Patient Name: Jeffrey [name] MRN: [id number] DOB:03-31-1951, 72 y.o., male Today's Date: 02/01/2023  END OF SESSION:  PT End of Session - 02/01/23 1258     Visit Number 4    Number of Visits 12    Authorization Type MCR    Progress Note Due on Visit 10    PT Start Time 1300    PT Stop Time 1340    PT Time Calculation (min) 40 min    Activity Tolerance Patient tolerated treatment well    Behavior During Therapy WFL for tasks assessed/performed              Past Medical History:  Diagnosis Date   Anxiety    Arthritis    knees   Blood transfusion without reported diagnosis    BPH (benign prostatic hypertrophy)    Cataract    "beginnings of"   Chronic kidney disease    kidney infection   Depression    GERD (gastroesophageal reflux disease)    Hypertension    Meralgia paresthetica of right side    SCCA (squamous cell carcinoma) of skin 11/05/2020   Left Thigh-Anterior (in situ) (curet and 5FU)   Past Surgical History:  Procedure Laterality Date   HERNIA REPAIR     inguinal, umbilical   SPLENECTOMY     Patient Active Problem List   Diagnosis Date Noted   Bilateral carotid bruits 01/13/2023   AC (acromioclavicular) arthritis 12/24/2022   Left rotator cuff tear 10/13/2022   Degenerative arthritis of knee 02/25/2021   Coronary artery disease due to lipid rich plaque 01/13/2021   Renal mass, right 12/17/2020   RLQ abdominal pain 11/08/2020   Umbilical hernia 11/08/2020   Neoplasm of uncertain behavior of skin 05/01/2020   Trigger finger of left hand 05/01/2020   Dyslipidemia 07/20/2019   Aortic atherosclerosis 04/14/2018   Right rotator cuff tear 01/13/2018   Abdominal pain 03/18/2016   Cervical disc disorder with radiculopathy of cervical region 12/06/2015   Edema 12/06/2015   Lower back pain 07/04/2015   Injury of popliteal region 12/07/2014   Right knee pain 11/13/2013   Pneumonia 12/21/2012    Fatigue 12/21/2012   Dry mouth 12/21/2012   Actinic keratoses 09/09/2012   Well adult exam 06/25/2011   History of splenectomy 06/25/2011   PSA elevation 06/25/2011   Ventral hernia 05/23/2010   Meralgia paresthetica 03/19/2009   Anxiety state 06/25/2007   ERECTILE DYSFUNCTION 06/25/2007   DEPRESSION 06/25/2007   Essential hypertension 06/25/2007   GERD 06/25/2007   BPH (benign prostatic hyperplasia) 06/25/2007    PCP: Tresa Garter, MD   REFERRING PROVIDER: Judi Saa, DO   REFERRING DIAG: [phone] (ICD-10-CM) - Acute pain of left shoulder   THERAPY DIAG:  Acute pain of left shoulder  Cramp and spasm  Stiffness of left shoulder, not elsewhere classified  Rationale for Evaluation and Treatment: Rehabilitation  ONSET DATE: December 2023  SUBJECTIVE:  SUBJECTIVE STATEMENT: Patient reporting his shoulder is sore. Not healing as quickly as the other shoulder.   PERTINENT HISTORY: Scoliosis, HTN, OA  PAIN:  Are you having pain? Yes: NPRS scale: 4/10 Pain location: left shoulder and down to shoulder blade Pain description: sharp Aggravating factors: horizontal ABD, OH Relieving factors: Aldara cream  PRECAUTIONS: None  WEIGHT BEARING RESTRICTIONS: No  FALLS:  Has patient fallen in last 6 months? No  LIVING ENVIRONMENT: Lives with: lives with their spouse Lives in: House/apartment Stairs:  N/A Has following equipment at home:  N/A  OCCUPATION: Works part time as Merchandiser, retailsupervisor at [employer]  PLOF: Independent  PATIENT GOALS: get it as good as the right  NEXT MD VISIT: May   OBJECTIVE:   DIAGNOSTIC FINDINGS:  XR 10/14/22 IMPRESSION: 1. Mild glenohumeral and acromioclavicular osteoarthritis. 2. Likely old healed fracture of the left clavicle  midshaft.  PATIENT SURVEYS :  Quick Dash  20.5 / 100 = 20.5 %  COGNITION: Overall cognitive status: Within functional limits for tasks assessed     SENSATION: WFL  POSTURE: Rounded shoulders, Fwd head, scoliosis resulting in depressed left shoulder and scap  UPPER EXTREMITY ROM:   A/P ROM Right eval Left eval Left  01/25/23  Shoulder flexion  180   Shoulder extension  FULL   Shoulder abduction  125/150 150/180  Shoulder adduction     Shoulder internal rotation  84/90 80  Shoulder external rotation  38/55 75  Elbow flexion  FULL   Elbow extension  FULL   (Blank rows = not tested)  UPPER EXTREMITY MMT:  MMT Right eval Left eval  Shoulder flexion  4-*  Shoulder extension  4+  Shoulder abduction  3+*  Shoulder adduction    Shoulder internal rotation  4  Shoulder external rotation  3+  Middle trapezius    Lower trapezius    Elbow flexion  5  Elbow extension  5  (Blank rows = not tested) * = PAIN (mild with ABD and ER)  SHOULDER SPECIAL TESTS: Rotator cuff assessment: Drop arm test: negative, Empty can test: negative, Full can test: positive , and Gerber lift off test: positive    JOINT MOBILITY TESTING:  WNL  PALPATION:  L pects, UT, subscap, lats, medial scapular mm.   TODAY'S TREATMENT:                                                                                                                                         DATE:  02/01/23 UBE L4 x 6 min Supine: hor ABD RTB x 10  ER RTB x 10 then L only x 10 B towel under L arm ER /IR "W" position 2# 2x10 B ER at (almost) 90 deg ABD 2# 2 x 10 B L serratus push x 10 some discomfort (painful with wt) SDLY : L ER 2 x 10 L Horizontal ABD 2 x 10  L ABD thumb up x 5 painful today Standing: L punch RTB x 20 Flex/Scaption/ABD to shoulder height thumbs up x 10 ea 1# for Flex and ABD/no wt scaption Bent over T x 20 L no wt Shelf reaches flexion L x 5 painful today  BATCA Low Rows 25# 2x 10 High rows 25#  2x10 Lat pull 20# x 20 Bow draw back 5# x 10 L only  01/25/23 UBE L4 x 6 min Supine: hor ABD RTB x 10  ER RTB x 10 then L only x 10 B towel under L arm ER /IR "W" position 2# 2x10 B ER at (almost) 90 deg ABD 2# 2 x 10 L only L serratus push 2# x 10 some discomfort SDLY : L ER 2 x 10, 1x 5 towel under arm L Horizontal ABD 2 x 10 L ABD thumb up 2x 10 Standing: L punch RTB x 20 L shoulder IR RTB x 20 L shoulder ext RTB x 10, GTB x 10 Flex/Scaption/ABD to shoulder height thumbs up x 10 ea 1# for Flex and ABD/no wt scaption  BATCA Low Rows 20# 2x 10 High rows 20# 2x10  Manual Therapy: to decrease muscle spasm and pain and improve mobility Assessment of tissue tension - significantly less than at eval ROM measured - see objective   01/20/23 [place]ys ABD x 2 min Standing: L shoulder Isometrics ABD straight arm and ER 10 sec hold x 5 ea L punch RTB x 20 L shoulder IR RTB x 20 L shoulder ext RTB x 20 Horizontal ABD at wall to fatigue RTB Flex/Scaption/ABD to shoulder height thumbs up x 10 ea L QL stretch in doorway x 30 sec  Supine: hor ABD RTB x 10 (better than standing) ER RTB x 10 B ER 2# 2x10 B SDLY: ER x 10 Horizontal ABD x 10 ABD thumb up x 10  BATCA Low Rows 20# x 10, 25# x 10 High rows 25# 2x10   01/18/23 Eval: See pt ed  PATIENT EDUCATION: Education details: HEP update' Person educated: Patient Education method: Explanation, Demonstration, and Handouts Education comprehension: verbalized understanding and returned demonstration  HOME EXERCISE PROGRAM: Access Code: [email or web address] URL: [email or web address] Date: 01/20/2023 Prepared by: Raynelle FanningJulie  Exercises - Isometric Shoulder Abduction - Arm Straight at Wall (Mirrored)  - 2 x daily - 7 x weekly - 1 sets - 5 reps - 10 sec  hold - Standing Isometric Shoulder External Rotation with Doorway (Mirrored)  - 2 x daily - 7 x weekly - 1 sets - 5 reps - 10 sec  hold - Single Arm Punch with Resistance  - 1  x daily - 7 x weekly - 1-3 sets - 10 reps - Single Arm Shoulder Extension with Resistance  - 1 x daily - 7 x weekly - 1-3 sets - 10 reps - Shoulder Internal Rotation with Resistance  - 1 x daily - 7 x weekly - 1-3 sets - 10 reps - Supine Shoulder Horizontal Abduction with Resistance  - 1 x daily - 3 x weekly - 3 sets - 10 reps - Sidelying Shoulder External Rotation  - 1 x daily - 3 x weekly - 3 sets - 10 reps - Sidelying Shoulder Horizontal Abduction  - 1 x daily - 3 x weekly - 1-3 sets - 10 reps - Standing Shoulder Flexion to 90 Degrees  - 1 x daily - 3 x weekly - 1-3 sets - 10 reps - Standing Shoulder Scaption  - 1 x daily - 3  x weekly - 1-3 sets - 10 reps - Shoulder Abduction - Thumbs Up  - 1 x daily - 3 x weekly - 1-3 sets - 10 reps  DO LYING ON YOUR BACK   Robber: Scapular Retraction: Elbow Flexion (Standing)   With elbows bent to 90, pinch shoulder blades together and rotate arms out, keeping elbows bent. Repeat _10___ times per set. Do _1-3___ sets per session. Do __1__ sessions per day. May use __2___ pound weights.    ASSESSMENT:  CLINICAL IMPRESSION: Kathlene November had increased soreness today after moving furniture yesterday. He was able to tolerate most exercises, but had increased pain with others today. He still has the most pain with horizontal ABD+ER, but had decreased pain with this at the end of session today. Kathlene November continues to demonstrate potential for improvement and would benefit from continued skilled therapy to address impairments.    OBJECTIVE IMPAIRMENTS: decreased ROM, decreased strength, increased muscle spasms, impaired flexibility, impaired UE functional use, postural dysfunction, and pain.   ACTIVITY LIMITATIONS: carrying, lifting, and reach over head  PARTICIPATION LIMITATIONS: occupation  PERSONAL FACTORS: 3+ comorbidities: HTN, OA, scoliosis  are also affecting patient's functional outcome.   REHAB POTENTIAL: Good  CLINICAL DECISION MAKING: Evolving/moderate  complexity  EVALUATION COMPLEXITY: Low  GOALS: Goals reviewed with patient? Yes  SHORT TERM GOALS: Target date: 01/27/2023   Patient will be independent with initial HEP.  Baseline:  Goal status: MET    LONG TERM GOALS: Target date: 03/10/2023   Patient will be independent with advanced/ongoing HEP to improve outcomes and carryover.  Baseline:  Goal status: INITIAL  2.  Patient will report 75% improvement in L shoulder pain to improve QOL.  Baseline:  Goal status: INITIAL  3.  Patient to demonstrate improved upright posture with posterior shoulder girdle engaged to promote improved glenohumeral joint mobility. Baseline:  Goal status: INITIAL  4.  Patient to improve L shoulder AROM to [facility] without pain provocation to allow for increased ease of ADLs including washing hair and reaching out to drive-thru window.  Baseline: limited with ABD, ER and standing flex Goal status: INITIAL  5.  Patient will demonstrate improved UE strength to 4+/5. Baseline:  Goal status: INITIAL  6  Patient will report 10 on Quick Dash to demonstrate improved functional ability.  Baseline:  20.5 / 100 = 20.5 %  PLAN: PT FREQUENCY: 2x/week  PT DURATION:  12 sessions  PLANNED INTERVENTIONS: Therapeutic exercises, Therapeutic activity, Neuromuscular re-education, Patient/Family education, Self Care, Joint mobilization, Dry Needling, Electrical stimulation, Spinal mobilization, Cryotherapy, Moist heat, Taping, Ultrasound, Ionotophoresis 4mg /ml Dexamethasone, and Manual therapy  PLAN FOR NEXT SESSION: continue shoulder and scapular strength, chest opening,shoulder ROM   Arsenio Schnorr, PT 02/01/2023, 1:45 PM

## 2023-02-01 ENCOUNTER — Ambulatory Visit: Payer: Medicare Other | Admitting: Physical Therapy

## 2023-02-01 ENCOUNTER — Encounter: Payer: Self-pay | Admitting: Physical Therapy

## 2023-02-01 DIAGNOSIS — R252 Cramp and spasm: Secondary | ICD-10-CM

## 2023-02-01 DIAGNOSIS — M25612 Stiffness of left shoulder, not elsewhere classified: Secondary | ICD-10-CM | POA: Diagnosis not present

## 2023-02-01 DIAGNOSIS — M25512 Pain in left shoulder: Secondary | ICD-10-CM | POA: Diagnosis not present

## 2023-02-02 ENCOUNTER — Other Ambulatory Visit (INDEPENDENT_AMBULATORY_CARE_PROVIDER_SITE_OTHER): Payer: Medicare Other

## 2023-02-02 DIAGNOSIS — Z0000 Encounter for general adult medical examination without abnormal findings: Secondary | ICD-10-CM | POA: Diagnosis not present

## 2023-02-02 LAB — COMPREHENSIVE METABOLIC PANEL
ALT: 12 U/L (ref 0–53)
AST: 18 U/L (ref 0–37)
Albumin: 3.5 g/dL (ref 3.5–5.2)
Alkaline Phosphatase: 79 U/L (ref 39–117)
BUN: 17 mg/dL (ref 6–23)
CO2: 33 mEq/L — ABNORMAL HIGH (ref 19–32)
Calcium: 9.5 mg/dL (ref 8.4–10.5)
Chloride: 101 mEq/L (ref 96–112)
Creatinine, Ser: 1.4 mg/dL (ref 0.40–1.50)
GFR: 50.03 mL/min — ABNORMAL LOW (ref >60.00)
Glucose, Bld: 91 mg/dL (ref 70–99)
Potassium: 4.5 mEq/L (ref 3.5–5.1)
Sodium: 141 mEq/L (ref 135–145)
Total Bilirubin: 0.6 mg/dL (ref 0.2–1.2)
Total Protein: 6 g/dL (ref 6.0–8.3)

## 2023-02-02 LAB — CBC WITH DIFFERENTIAL/PLATELET
Basophils Absolute: 0.1 10*3/uL (ref 0.0–0.1)
Basophils Relative: 0.7 % (ref 0.0–3.0)
Eosinophils Absolute: 0.2 10*3/uL (ref 0.0–0.7)
Eosinophils Relative: 2.6 % (ref 0.0–5.0)
HCT: 43.4 % (ref 39.0–52.0)
Hemoglobin: 14.3 g/dL (ref 13.0–17.0)
Lymphocytes Relative: 18.6 % (ref 12.0–46.0)
Lymphs Abs: 1.7 10*3/uL (ref 0.7–4.0)
MCHC: 32.9 g/dL (ref 30.0–36.0)
MCV: 98.9 fl (ref 78.0–100.0)
Monocytes Absolute: 1 10*3/uL (ref 0.1–1.0)
Monocytes Relative: 10.4 % (ref 3.0–12.0)
Neutro Abs: 6.2 10*3/uL (ref 1.4–7.7)
Neutrophils Relative %: 67.7 % (ref 43.0–77.0)
Platelets: 359 10*3/uL (ref 150.0–400.0)
RBC: 4.39 Mil/uL (ref 4.22–5.81)
RDW: 14.2 % (ref 11.5–15.5)
WBC: 9.2 10*3/uL (ref 4.0–10.5)

## 2023-02-02 LAB — LIPID PANEL
Cholesterol: 117 mg/dL (ref 0–200)
HDL: 48.5 mg/dL (ref >39.00)
LDL Cholesterol: 59 mg/dL (ref 0–99)
NonHDL: 68.96
Total CHOL/HDL Ratio: 2
Triglycerides: 52 mg/dL (ref 0.0–149.0)
VLDL: 10.4 mg/dL (ref 0.0–40.0)

## 2023-02-02 LAB — URINALYSIS
Bilirubin Urine: NEGATIVE
Hgb urine dipstick: NEGATIVE
Ketones, ur: NEGATIVE
Leukocytes,Ua: NEGATIVE
Nitrite: NEGATIVE
Specific Gravity, Urine: 1.015 (ref 1.000–1.030)
Total Protein, Urine: NEGATIVE
Urine Glucose: NEGATIVE
Urobilinogen, UA: 1 (ref 0.0–1.0)
pH: 7.5 (ref 5.0–8.0)

## 2023-02-02 LAB — TSH: TSH: 1.62 u[IU]/mL (ref 0.35–5.50)

## 2023-02-02 LAB — PSA: PSA: 4.01 ng/mL — ABNORMAL HIGH (ref 0.10–4.00)

## 2023-02-05 ENCOUNTER — Other Ambulatory Visit: Payer: Self-pay | Admitting: Surgery

## 2023-02-05 DIAGNOSIS — K432 Incisional hernia without obstruction or gangrene: Secondary | ICD-10-CM

## 2023-02-07 NOTE — Therapy (Signed)
OUTPATIENT PHYSICAL THERAPY UPPER EXTREMITY TREATMENT   Patient Name: Jeffrey [name] MRN: [id number] DOB:03-31-1951, 72 y.o., male Today's Date: 02/01/2023  END OF SESSION:  PT End of Session - 02/01/23 1258     Visit Number 4    Number of Visits 12    Authorization Type MCR    Progress Note Due on Visit 10    PT Start Time 1300    PT Stop Time 1340    PT Time Calculation (min) 40 min    Activity Tolerance Patient tolerated treatment well    Behavior During Therapy WFL for tasks assessed/performed              Past Medical History:  Diagnosis Date   Anxiety    Arthritis    knees   Blood transfusion without reported diagnosis    BPH (benign prostatic hypertrophy)    Cataract    "beginnings of"   Chronic kidney disease    kidney infection   Depression    GERD (gastroesophageal reflux disease)    Hypertension    Meralgia paresthetica of right side    SCCA (squamous cell carcinoma) of skin 11/05/2020   Left Thigh-Anterior (in situ) (curet and 5FU)   Past Surgical History:  Procedure Laterality Date   HERNIA REPAIR     inguinal, umbilical   SPLENECTOMY     Patient Active Problem List   Diagnosis Date Noted   Bilateral carotid bruits 01/13/2023   AC (acromioclavicular) arthritis 12/24/2022   Left rotator cuff tear 10/13/2022   Degenerative arthritis of knee 02/25/2021   Coronary artery disease due to lipid rich plaque 01/13/2021   Renal mass, right 12/17/2020   RLQ abdominal pain 11/08/2020   Umbilical hernia 11/08/2020   Neoplasm of uncertain behavior of skin 05/01/2020   Trigger finger of left hand 05/01/2020   Dyslipidemia 07/20/2019   Aortic atherosclerosis 04/14/2018   Right rotator cuff tear 01/13/2018   Abdominal pain 03/18/2016   Cervical disc disorder with radiculopathy of cervical region 12/06/2015   Edema 12/06/2015   Lower back pain 07/04/2015   Injury of popliteal region 12/07/2014   Right knee pain 11/13/2013   Pneumonia 12/21/2012    Fatigue 12/21/2012   Dry mouth 12/21/2012   Actinic keratoses 09/09/2012   Well adult exam 06/25/2011   History of splenectomy 06/25/2011   PSA elevation 06/25/2011   Ventral hernia 05/23/2010   Meralgia paresthetica 03/19/2009   Anxiety state 06/25/2007   ERECTILE DYSFUNCTION 06/25/2007   DEPRESSION 06/25/2007   Essential hypertension 06/25/2007   GERD 06/25/2007   BPH (benign prostatic hyperplasia) 06/25/2007    PCP: Tresa Garter, MD   REFERRING PROVIDER: Judi Saa, DO   REFERRING DIAG: [phone] (ICD-10-CM) - Acute pain of left shoulder   THERAPY DIAG:  Acute pain of left shoulder  Cramp and spasm  Stiffness of left shoulder, not elsewhere classified  Rationale for Evaluation and Treatment: Rehabilitation  ONSET DATE: December 2023  SUBJECTIVE:  SUBJECTIVE STATEMENT: ***  PERTINENT HISTORY: Scoliosis, HTN, OA  PAIN:  Are you having pain? Yes: NPRS scale: 4/10 Pain location: left shoulder and down to shoulder blade Pain description: sharp Aggravating factors: horizontal ABD, OH Relieving factors: Aldara cream  PRECAUTIONS: None  WEIGHT BEARING RESTRICTIONS: No  FALLS:  Has patient fallen in last 6 months? No  LIVING ENVIRONMENT: Lives with: lives with their spouse Lives in: House/apartment Stairs:  N/A Has following equipment at home:  N/A  OCCUPATION: Works part time as Merchandiser, retail at [employer]  PLOF: Independent  PATIENT GOALS: get it as good as the right  NEXT MD VISIT: May   OBJECTIVE:   DIAGNOSTIC FINDINGS:  XR 10/14/22 IMPRESSION: 1. Mild glenohumeral and acromioclavicular osteoarthritis. 2. Likely old healed fracture of the left clavicle midshaft.  PATIENT SURVEYS :  Quick Dash  20.5 / 100 = 20.5 %  COGNITION: Overall  cognitive status: Within functional limits for tasks assessed     SENSATION: WFL  POSTURE: Rounded shoulders, Fwd head, scoliosis resulting in depressed left shoulder and scap  UPPER EXTREMITY ROM:   A/P ROM Right eval Left eval Left  01/25/23 Left 02/08/23  Shoulder flexion  180    Shoulder extension  FULL    Shoulder abduction  125/150 150/180   Shoulder adduction      Shoulder internal rotation  84/90 80   Shoulder external rotation  38/55 75   Elbow flexion  FULL    Elbow extension  FULL    (Blank rows = not tested)  UPPER EXTREMITY MMT:  MMT Right eval Left eval Left  02/08/23  Shoulder flexion  4-*   Shoulder extension  4+   Shoulder abduction  3+*   Shoulder adduction     Shoulder internal rotation  4   Shoulder external rotation  3+   Middle trapezius     Lower trapezius     Elbow flexion  5   Elbow extension  5   (Blank rows = not tested) * = PAIN (mild with ABD and ER)  SHOULDER SPECIAL TESTS: Rotator cuff assessment: Drop arm test: negative, Empty can test: negative, Full can test: positive , and Gerber lift off test: positive    JOINT MOBILITY TESTING:  WNL  PALPATION:  L pects, UT, subscap, lats, medial scapular mm.   TODAY'S TREATMENT:                                                                                                                                         DATE:  02/08/23 UBE L4 x 6 min Supine: hor ABD RTB x 10  ER RTB x 10 then L only x 10 B towel under L arm ER /IR "W" position 2# 2x10 B ER at (almost) 90 deg ABD 2# 2 x 10 B L serratus push x 10 some discomfort (painful with wt) SDLY : L ER  2 x 10 L Horizontal ABD 2 x 10 L ABD thumb up x 5 painful today Standing: L punch RTB x 20 Flex/Scaption/ABD to shoulder height thumbs up x 10 ea 1# for Flex and ABD/no wt scaption Bent over T x 20 L no wt Shelf reaches flexion L x 5 painful today  BATCA Low Rows 25# 2x 10 High rows 25# 2x10 Lat pull 20# x 20 Bow draw back 5# x 10  L only  02/01/23 UBE L4 x 6 min Supine: hor ABD RTB x 10  ER RTB x 10 then L only x 10 B towel under L arm ER /IR "W" position 2# 2x10 B ER at (almost) 90 deg ABD 2# 2 x 10 B L serratus push x 10 some discomfort (painful with wt) SDLY : L ER 2 x 10 L Horizontal ABD 2 x 10 L ABD thumb up x 5 painful today Standing: L punch RTB x 20 Flex/Scaption/ABD to shoulder height thumbs up x 10 ea 1# for Flex and ABD/no wt scaption Bent over T x 20 L no wt Shelf reaches flexion L x 5 painful today  BATCA Low Rows 25# 2x 10 High rows 25# 2x10 Lat pull 20# x 20 Bow draw back 5# x 10 L only  01/25/23 UBE L4 x 6 min Supine: hor ABD RTB x 10  ER RTB x 10 then L only x 10 B towel under L arm ER /IR "W" position 2# 2x10 B ER at (almost) 90 deg ABD 2# 2 x 10 L only L serratus push 2# x 10 some discomfort SDLY : L ER 2 x 10, 1x 5 towel under arm L Horizontal ABD 2 x 10 L ABD thumb up 2x 10 Standing: L punch RTB x 20 L shoulder IR RTB x 20 L shoulder ext RTB x 10, GTB x 10 Flex/Scaption/ABD to shoulder height thumbs up x 10 ea 1# for Flex and ABD/no wt scaption  BATCA Low Rows 20# 2x 10 High rows 20# 2x10  Manual Therapy: to decrease muscle spasm and pain and improve mobility Assessment of tissue tension - significantly less than at eval ROM measured - see objective    PATIENT EDUCATION: Education details: HEP update' Person educated: Patient Education method: Explanation, Demonstration, and Handouts Education comprehension: verbalized understanding and returned demonstration  HOME EXERCISE PROGRAM: Access Code: [email or web address] URL: [email or web address] Date: 01/20/2023 Prepared by: Raynelle Fanning  Exercises - Isometric Shoulder Abduction - Arm Straight at Wall (Mirrored)  - 2 x daily - 7 x weekly - 1 sets - 5 reps - 10 sec  hold - Standing Isometric Shoulder External Rotation with Doorway (Mirrored)  - 2 x daily - 7 x weekly - 1 sets - 5 reps - 10 sec  hold - Single Arm  Punch with Resistance  - 1 x daily - 7 x weekly - 1-3 sets - 10 reps - Single Arm Shoulder Extension with Resistance  - 1 x daily - 7 x weekly - 1-3 sets - 10 reps - Shoulder Internal Rotation with Resistance  - 1 x daily - 7 x weekly - 1-3 sets - 10 reps - Supine Shoulder Horizontal Abduction with Resistance  - 1 x daily - 3 x weekly - 3 sets - 10 reps - Sidelying Shoulder External Rotation  - 1 x daily - 3 x weekly - 3 sets - 10 reps - Sidelying Shoulder Horizontal Abduction  - 1 x daily - 3 x weekly - 1-3 sets - 10 reps -  Standing Shoulder Flexion to 90 Degrees  - 1 x daily - 3 x weekly - 1-3 sets - 10 reps - Standing Shoulder Scaption  - 1 x daily - 3 x weekly - 1-3 sets - 10 reps - Shoulder Abduction - Thumbs Up  - 1 x daily - 3 x weekly - 1-3 sets - 10 reps  DO LYING ON YOUR BACK   Robber: Scapular Retraction: Elbow Flexion (Standing)   With elbows bent to 90, pinch shoulder blades together and rotate arms out, keeping elbows bent. Repeat _10___ times per set. Do _1-3___ sets per session. Do __1__ sessions per day. May use __2___ pound weights.    ASSESSMENT:  CLINICAL IMPRESSION: ***   OBJECTIVE IMPAIRMENTS: decreased ROM, decreased strength, increased muscle spasms, impaired flexibility, impaired UE functional use, postural dysfunction, and pain.   ACTIVITY LIMITATIONS: carrying, lifting, and reach over head  PARTICIPATION LIMITATIONS: occupation  PERSONAL FACTORS: 3+ comorbidities: HTN, OA, scoliosis  are also affecting patient's functional outcome.   REHAB POTENTIAL: Good  CLINICAL DECISION MAKING: Evolving/moderate complexity  EVALUATION COMPLEXITY: Low  GOALS: Goals reviewed with patient? Yes  SHORT TERM GOALS: Target date: 01/27/2023   Patient will be independent with initial HEP.  Baseline:  Goal status: MET    LONG TERM GOALS: Target date: 03/10/2023   Patient will be independent with advanced/ongoing HEP to improve outcomes and carryover.  Baseline:   Goal status: INITIAL  2.  Patient will report 75% improvement in L shoulder pain to improve QOL.  Baseline:  Goal status: INITIAL  3.  Patient to demonstrate improved upright posture with posterior shoulder girdle engaged to promote improved glenohumeral joint mobility. Baseline:  Goal status: INITIAL  4.  Patient to improve L shoulder AROM to [facility] without pain provocation to allow for increased ease of ADLs including washing hair and reaching out to drive-thru window.  Baseline: limited with ABD, ER and standing flex Goal status: INITIAL  5.  Patient will demonstrate improved UE strength to 4+/5. Baseline:  Goal status: INITIAL  6  Patient will report 10 on Quick Dash to demonstrate improved functional ability.  Baseline:  20.5 / 100 = 20.5 %  PLAN: PT FREQUENCY: 2x/week  PT DURATION:  12 sessions  PLANNED INTERVENTIONS: Therapeutic exercises, Therapeutic activity, Neuromuscular re-education, Patient/Family education, Self Care, Joint mobilization, Dry Needling, Electrical stimulation, Spinal mobilization, Cryotherapy, Moist heat, Taping, Ultrasound, Ionotophoresis 4mg /ml Dexamethasone, and Manual therapy  PLAN FOR NEXT SESSION: continue shoulder and scapular strength, chest opening,shoulder ROM   Shae Hinnenkamp, PT 02/01/2023, 1:45 PM

## 2023-02-08 ENCOUNTER — Encounter: Payer: Self-pay | Admitting: Physical Therapy

## 2023-02-08 ENCOUNTER — Ambulatory Visit: Payer: Medicare Other | Admitting: Physical Therapy

## 2023-02-08 DIAGNOSIS — M25512 Pain in left shoulder: Secondary | ICD-10-CM | POA: Diagnosis not present

## 2023-02-08 DIAGNOSIS — M25612 Stiffness of left shoulder, not elsewhere classified: Secondary | ICD-10-CM | POA: Diagnosis not present

## 2023-02-08 DIAGNOSIS — R252 Cramp and spasm: Secondary | ICD-10-CM

## 2023-02-09 NOTE — Therapy (Signed)
OUTPATIENT PHYSICAL THERAPY UPPER EXTREMITY TREATMENT   Patient Name: Jeffrey [name] MRN: [id number] DOB:18-May-1951, 72 y.o., male Today's Date: 02/10/2023  END OF SESSION:  PT End of Session - 02/10/23 1257     Visit Number 6    Number of Visits 12    Date for PT Re-Evaluation 03/10/23    Authorization Type MCR    Progress Note Due on Visit 10    PT Start Time 1258    PT Stop Time 1343    PT Time Calculation (min) 45 min    Activity Tolerance Patient tolerated treatment well    Behavior During Therapy WFL for tasks assessed/performed              Past Medical History:  Diagnosis Date   Anxiety    Arthritis    knees   Blood transfusion without reported diagnosis    BPH (benign prostatic hypertrophy)    Cataract    "beginnings of"   Chronic kidney disease    kidney infection   Depression    GERD (gastroesophageal reflux disease)    Hypertension    Meralgia paresthetica of right side    SCCA (squamous cell carcinoma) of skin 11/05/2020   Left Thigh-Anterior (in situ) (curet and 5FU)   Past Surgical History:  Procedure Laterality Date   HERNIA REPAIR     inguinal, umbilical   SPLENECTOMY     Patient Active Problem List   Diagnosis Date Noted   Bilateral carotid bruits 01/13/2023   AC (acromioclavicular) arthritis 12/24/2022   Left rotator cuff tear 10/13/2022   Degenerative arthritis of knee 02/25/2021   Coronary artery disease due to lipid rich plaque 01/13/2021   Renal mass, right 12/17/2020   RLQ abdominal pain 11/08/2020   Umbilical hernia 11/08/2020   Neoplasm of uncertain behavior of skin 05/01/2020   Trigger finger of left hand 05/01/2020   Dyslipidemia 07/20/2019   Aortic atherosclerosis 04/14/2018   Right rotator cuff tear 01/13/2018   Abdominal pain 03/18/2016   Cervical disc disorder with radiculopathy of cervical region 12/06/2015   Edema 12/06/2015   Lower back pain 07/04/2015   Injury of popliteal region 12/07/2014   Right knee pain  11/13/2013   Pneumonia 12/21/2012   Fatigue 12/21/2012   Dry mouth 12/21/2012   Actinic keratoses 09/09/2012   Well adult exam 06/25/2011   History of splenectomy 06/25/2011   PSA elevation 06/25/2011   Ventral hernia 05/23/2010   Meralgia paresthetica 03/19/2009   Anxiety state 06/25/2007   ERECTILE DYSFUNCTION 06/25/2007   DEPRESSION 06/25/2007   Essential hypertension 06/25/2007   GERD 06/25/2007   BPH (benign prostatic hyperplasia) 06/25/2007    PCP: Tresa Garter, MD   REFERRING PROVIDER: Judi Saa, DO   REFERRING DIAG: [id number] (ICD-10-CM) - Acute pain of left shoulder   THERAPY DIAG:  Acute pain of left shoulder  Cramp and spasm  Stiffness of left shoulder, not elsewhere classified  Rationale for Evaluation and Treatment: Rehabilitation  ONSET DATE: December 2023  SUBJECTIVE:  SUBJECTIVE STATEMENT: Patient reporting ongoing soreness in left Pectorals especially with reaching   PERTINENT HISTORY: Scoliosis, HTN, OA  PAIN:  Are you having pain? Yes: NPRS scale: 3/10 Pain location: left shoulder and down to shoulder blade Pain description: sharp Aggravating factors: horizontal ABD, OH Relieving factors: Aldara cream  PRECAUTIONS: None  WEIGHT BEARING RESTRICTIONS: No  FALLS:  Has patient fallen in last 6 months? No  LIVING ENVIRONMENT: Lives with: lives with their spouse Lives in: House/apartment Stairs:  N/A Has following equipment at home:  N/A  OCCUPATION: Works part time as Merchandiser, retail at [employer]  PLOF: Independent  PATIENT GOALS: get it as good as the right  NEXT MD VISIT: May   OBJECTIVE:   DIAGNOSTIC FINDINGS:  XR 10/14/22 IMPRESSION: 1. Mild glenohumeral and acromioclavicular osteoarthritis. 2. Likely old healed fracture of  the left clavicle midshaft.  PATIENT SURVEYS :  Quick Dash  20.5 / 100 = 20.5 %  COGNITION: Overall cognitive status: Within functional limits for tasks assessed     SENSATION: WFL  POSTURE: Rounded shoulders, Fwd head, scoliosis resulting in depressed left shoulder and scap  UPPER EXTREMITY ROM:   A/P ROM Right eval Left eval Left  01/25/23 Left 02/08/23  Shoulder flexion  180  120 standing  Shoulder extension  FULL    Shoulder abduction  125/150 150/180 125 standing  Shoulder adduction      Shoulder internal rotation  84/90 80   Shoulder external rotation  38/55 75   Elbow flexion  FULL    Elbow extension  FULL    (Blank rows = not tested)  UPPER EXTREMITY MMT:  MMT Right eval Left eval Left  02/08/23  Shoulder flexion  4-* 4+  Shoulder extension  4+ 5  Shoulder abduction  3+* 4+  Shoulder adduction     Shoulder internal rotation  4 4+  Shoulder external rotation  3+ 3+  Middle trapezius     Lower trapezius     Elbow flexion  5   Elbow extension  5   (Blank rows = not tested) * = PAIN (mild with ABD and ER)  SHOULDER SPECIAL TESTS: Rotator cuff assessment: Drop arm test: negative, Empty can test: negative, Full can test: positive , and Gerber lift off test: positive    JOINT MOBILITY TESTING:  WNL  PALPATION:  L pects, UT, subscap, lats, medial scapular mm.   TODAY'S TREATMENT:                                                                                                                                         DATE:   02/10/23  Manual Therapy: to decrease muscle spasm and pain and improve mobility Skilled palpation and monitoring of soft tissues during DN STM and TPR to L UT, lats, rhomboids, LS and infraspinatus/teres Trigger Point Dry-Needling  Treatment instructions: Expect mild to moderate muscle soreness. S/S  of pneumothorax if dry needled over a lung field, and to seek immediate medical attention should they occur. Patient verbalized  understanding of these instructions and education. Patient Consent Given: Yes Education handout provided: Previously provided Muscles treated: L pectorals, IS, lats, LS, UT, subscapularis Electrical stimulation performed: No Parameters: N/A Treatment response/outcome: Twitch Response Elicited and Palpable Increase in Muscle Length Standing: Shelf reaches flexion L 2x10 Flex/scaption/ABD x 10 ea 1# wt Robber x 10, then ER x 20 with back against foam roller Wall ball with red plyo circles x 20, up/down x 10, side/side x 10   02/08/23 UBE L4 x 6 min Supine: hor ABD GTB 2 x 10  ER RTB x 10 then L only x 10 B towel under L arm ER /IR "W" position 2# 2x10 B ER at (almost) 90 deg ABD 2# 2 x 10 B SDLY : L ER 1 x 10, 1x25 (second set with 3 sec hold) ABD 1 x 10, 1# wt 1 x12 L horizontal ABD x 5, 1# wt x 15 Standing: Shelf reaches flexion L 2x10  BATCA Low Rows 35# 2x 10 High rows 25# 2x10 Chest press 20# 2x Bow draw back 5# x 11 L only  Manual: IASTM to left pectorals  02/01/23 UBE L4 x 6 min Supine: hor ABD RTB x 10  ER RTB x 10 then L only x 10 B towel under L arm ER /IR "W" position 2# 2x10 B ER at (almost) 90 deg ABD 2# 2 x 10 B L serratus push x 10 some discomfort (painful with wt) SDLY : L ER 2 x 10 L Horizontal ABD 2 x 10 L ABD thumb up x 5 painful today Standing: L punch RTB x 20 Flex/Scaption/ABD to shoulder height thumbs up x 10 ea 1# for Flex and ABD/no wt scaption Bent over T x 20 L no wt Shelf reaches flexion L x 5 painful today  BATCA Low Rows 25# 2x 10 High rows 25# 2x10 Lat pull 20# x 20 Bow draw back 5# x 10 L only   PATIENT EDUCATION: Education details: HEP update' Person educated: Patient Education method: Explanation, Demonstration, and Handouts Education comprehension: verbalized understanding and returned demonstration  HOME EXERCISE PROGRAM: Access Code: [email or web address] URL: [email or web address] Date: 02/10/2023 Prepared by:  Raynelle Fanning  Exercises - Isometric Shoulder Abduction - Arm Straight at Wall (Mirrored)  - 2 x daily - 7 x weekly - 1 sets - 5 reps - 10 sec  hold - Standing Isometric Shoulder External Rotation with Doorway (Mirrored)  - 2 x daily - 7 x weekly - 1 sets - 5 reps - 10 sec  hold - Single Arm Punch with Resistance  - 1 x daily - 7 x weekly - 1-3 sets - 10 reps - Single Arm Shoulder Extension with Resistance  - 1 x daily - 7 x weekly - 1-3 sets - 10 reps - Shoulder Internal Rotation with Resistance  - 1 x daily - 7 x weekly - 1-3 sets - 10 reps - Supine Shoulder Horizontal Abduction with Resistance  - 1 x daily - 3 x weekly - 3 sets - 10 reps - Sidelying Shoulder External Rotation  - 1 x daily - 3 x weekly - 3 sets - 10 reps - Sidelying Shoulder Horizontal Abduction  - 1 x daily - 3 x weekly - 1-3 sets - 10 reps - Standing Shoulder Flexion to 90 Degrees  - 1 x daily - 3 x weekly - 1-3 sets - 10  reps - Standing Shoulder Scaption  - 1 x daily - 3 x weekly - 1-3 sets - 10 reps - Shoulder Abduction - Thumbs Up  - 1 x daily - 3 x weekly - 1-3 sets - 10 reps - Standing Wall Ball Circles with Plyo Ball  - 1 x daily - 7 x weekly - 2 sets - 10 reps  DO LYING ON YOUR BACK   Robber: Scapular Retraction: Elbow Flexion (Standing)   With elbows bent to 90, pinch shoulder blades together and rotate arms out, keeping elbows bent. Repeat _10___ times per set. Do _1-3___ sets per session. Do __1__ sessions per day. May use __2___ pound weights.    ASSESSMENT:  CLINICAL IMPRESSION: Kathlene November presents with ongoing pain with reaching forward. Excellent response to DN/MT to pectorals and parascapular muscles. He was able to complete shelf reaches after without pain. He still fatifues easily with this and with standing scaption, but was able to tolerate scaption today.    OBJECTIVE IMPAIRMENTS: decreased ROM, decreased strength, increased muscle spasms, impaired flexibility, impaired UE functional use, postural  dysfunction, and pain.   ACTIVITY LIMITATIONS: carrying, lifting, and reach over head  PARTICIPATION LIMITATIONS: occupation  PERSONAL FACTORS: 3+ comorbidities: HTN, OA, scoliosis  are also affecting patient's functional outcome.   REHAB POTENTIAL: Good  CLINICAL DECISION MAKING: Evolving/moderate complexity  EVALUATION COMPLEXITY: Low  GOALS: Goals reviewed with patient? Yes  SHORT TERM GOALS: Target date: 01/27/2023   Patient will be independent with initial HEP.  Baseline:  Goal status: MET    LONG TERM GOALS: Target date: 03/10/2023   Patient will be independent with advanced/ongoing HEP to improve outcomes and carryover.  Baseline:  Goal status: IN PROGRESS  2.  Patient will report 75% improvement in L shoulder pain to improve QOL.  Baseline:  Goal status: IN PROGRESS 50% improvement  3.  Patient to demonstrate improved upright posture with posterior shoulder girdle engaged to promote improved glenohumeral joint mobility. Baseline:  Goal status: IN PROGRESS  4.  Patient to improve L shoulder AROM to [facility] without pain provocation to allow for increased ease of ADLs including washing hair and reaching out to drive-thru window.  Baseline: limited with ABD, ER and standing flex Goal status: IN PROGRESS 02/08/23 able to wash hair without pain; drive thru motion no pain today.  5.  Patient will demonstrate improved UE strength to 4+/5. Baseline:  Goal status: IN PROGRESS partially met 02/08/23  6  Patient will report 10 on Quick Dash to demonstrate improved functional ability.  Baseline:  20.5 / 100 = 20.5 %  PLAN: PT FREQUENCY: 2x/week  PT DURATION:  12 sessions  PLANNED INTERVENTIONS: Therapeutic exercises, Therapeutic activity, Neuromuscular re-education, Patient/Family education, Self Care, Joint mobilization, Dry Needling, Electrical stimulation, Spinal mobilization, Cryotherapy, Moist heat, Taping, Ultrasound, Ionotophoresis 4mg /ml Dexamethasone, and Manual  therapy  PLAN FOR NEXT SESSION: continue shoulder and scapular strength, chest opening,shoulder ROM   Mattia Liford, PT 02/10/2023, 1:49 PM

## 2023-02-10 ENCOUNTER — Encounter: Payer: Self-pay | Admitting: Physical Therapy

## 2023-02-10 ENCOUNTER — Ambulatory Visit: Payer: Medicare Other | Admitting: Physical Therapy

## 2023-02-10 DIAGNOSIS — M25612 Stiffness of left shoulder, not elsewhere classified: Secondary | ICD-10-CM | POA: Diagnosis not present

## 2023-02-10 DIAGNOSIS — R252 Cramp and spasm: Secondary | ICD-10-CM | POA: Diagnosis not present

## 2023-02-10 DIAGNOSIS — M25512 Pain in left shoulder: Secondary | ICD-10-CM | POA: Diagnosis not present

## 2023-02-14 NOTE — Therapy (Signed)
OUTPATIENT PHYSICAL THERAPY UPPER EXTREMITY TREATMENT   Patient Name: Jeffrey [name] MRN: [id number] DOB:[birth year], 72 y.o., male Today's Date: 02/15/2023  END OF SESSION:  PT End of Session - 02/15/23 1255     Visit Number 7    Number of Visits 12    Date for PT Re-Evaluation 03/10/23    Authorization Type MCR    Progress Note Due on Visit 10    PT Start Time 1300    PT Stop Time 1342    PT Time Calculation (min) 42 min    Activity Tolerance Patient tolerated treatment well    Behavior During Therapy WFL for tasks assessed/performed              Past Medical History:  Diagnosis Date   Anxiety    Arthritis    knees   Blood transfusion without reported diagnosis    BPH (benign prostatic hypertrophy)    Cataract    "beginnings of"   Chronic kidney disease    kidney infection   Depression    GERD (gastroesophageal reflux disease)    Hypertension    Meralgia paresthetica of right side    SCCA (squamous cell carcinoma) of skin 11/05/2020   Left Thigh-Anterior (in situ) (curet and 5FU)   Past Surgical History:  Procedure Laterality Date   HERNIA REPAIR     inguinal, umbilical   SPLENECTOMY     Patient Active Problem List   Diagnosis Date Noted   Bilateral carotid bruits 01/13/2023   AC (acromioclavicular) arthritis 12/24/2022   Left rotator cuff tear 10/13/2022   Degenerative arthritis of knee 02/25/2021   Coronary artery disease due to lipid rich plaque 01/13/2021   Renal mass, right 12/17/2020   RLQ abdominal pain 11/08/2020   Umbilical hernia 11/08/2020   Neoplasm of uncertain behavior of skin 05/01/2020   Trigger finger of left hand 05/01/2020   Dyslipidemia 07/20/2019   Aortic atherosclerosis 04/14/2018   Right rotator cuff tear 01/13/2018   Abdominal pain 03/18/2016   Cervical disc disorder with radiculopathy of cervical region 12/06/2015   Edema 12/06/2015   Lower back pain 07/04/2015   Injury of popliteal region 12/07/2014   Right knee pain  11/13/2013   Pneumonia 12/21/2012   Fatigue 12/21/2012   Dry mouth 12/21/2012   Actinic keratoses 09/09/2012   Well adult exam 06/25/2011   History of splenectomy 06/25/2011   PSA elevation 06/25/2011   Ventral hernia 05/23/2010   Meralgia paresthetica 03/19/2009   Anxiety state 06/25/2007   ERECTILE DYSFUNCTION 06/25/2007   DEPRESSION 06/25/2007   Essential hypertension 06/25/2007   GERD 06/25/2007   BPH (benign prostatic hyperplasia) 06/25/2007    PCP: Tresa Garter, MD   REFERRING PROVIDER: Judi Saa, DO   REFERRING DIAG: [phone] (ICD-10-CM) - Acute pain of left shoulder   THERAPY DIAG:  Acute pain of left shoulder  Cramp and spasm  Stiffness of left shoulder, not elsewhere classified  Rationale for Evaluation and Treatment: Rehabilitation  ONSET DATE: December 2023  SUBJECTIVE:  SUBJECTIVE STATEMENT: I slept good last night. It bothered my a little Saturday, but I didn't do anything with it Sunday, so feels pretty good today.  PERTINENT HISTORY: Scoliosis, HTN, OA  PAIN:  Are you having pain? Yes: NPRS scale: 0/10 Pain location: left shoulder and down to shoulder blade Pain description: sharp Aggravating factors: horizontal ABD, OH Relieving factors: Aldara cream  PRECAUTIONS: None  WEIGHT BEARING RESTRICTIONS: No  FALLS:  Has patient fallen in last 6 months? No  LIVING ENVIRONMENT: Lives with: lives with their spouse Lives in: House/apartment Stairs:  N/A Has following equipment at home:  N/A  OCCUPATION: Works part time as Merchandiser, retail at [employer]  PLOF: Independent  PATIENT GOALS: get it as good as the right  NEXT MD VISIT: May   OBJECTIVE:   DIAGNOSTIC FINDINGS:  XR 10/14/22 IMPRESSION: 1. Mild glenohumeral and acromioclavicular  osteoarthritis. 2. Likely old healed fracture of the left clavicle midshaft.  PATIENT SURVEYS :  Quick Dash  20.5 / 100 = 20.5 %  COGNITION: Overall cognitive status: Within functional limits for tasks assessed     SENSATION: WFL  POSTURE: Rounded shoulders, Fwd head, scoliosis resulting in depressed left shoulder and scap  UPPER EXTREMITY ROM:   A/P ROM Right eval Left eval Left  01/25/23 Left 02/08/23  Shoulder flexion  180  120 standing  Shoulder extension  FULL    Shoulder abduction  125/150 150/180 125 standing  Shoulder adduction      Shoulder internal rotation  84/90 80   Shoulder external rotation  38/55 75   Elbow flexion  FULL    Elbow extension  FULL    (Blank rows = not tested)  UPPER EXTREMITY MMT:  MMT Right eval Left eval Left  02/08/23  Shoulder flexion  4-* 4+  Shoulder extension  4+ 5  Shoulder abduction  3+* 4+  Shoulder adduction     Shoulder internal rotation  4 4+  Shoulder external rotation  3+ 3+  Middle trapezius     Lower trapezius     Elbow flexion  5   Elbow extension  5   (Blank rows = not tested) * = PAIN (mild with ABD and ER)  SHOULDER SPECIAL TESTS: Rotator cuff assessment: Drop arm test: negative, Empty can test: negative, Full can test: positive , and Gerber lift off test: positive    JOINT MOBILITY TESTING:  WNL  PALPATION:  L pects, UT, subscap, lats, medial scapular mm.   TODAY'S TREATMENT:                                                                                                                                         DATE:   02/15/23 UBE L4 x 6 min Seated  horizontal ABD  RTB x 20 Supine: ER RTB x 21 Diagonals R x 20 RTB (D2 flex) Reverse diagonal with 2# weight (D2 ext) -  did not focus on rotational component today ER /IR "W" position 2# 2x10 B ER at (almost) 90 deg ABD 2# 2 x 10 B SDLY : L ER 2 x 10 3 sec tempo L Horizontal ABD 1 x 10 2# L ABD thumb up x 10 Standing: Shelf reaches flexion 1# L 1x10  fatigues Flex/scaption/ x 10 ea 1# wt with flexion only; fatigued with scaption today and started to hurt Wall ball with red plyo circles x 20, up/down x 10 Bent over T x 20 L 1#  BATCA Low Rows 25# 2x 10 High rows 25# 2x10 Lat pull 25# x 20   02/10/23 Manual Therapy: to decrease muscle spasm and pain and improve mobility Skilled palpation and monitoring of soft tissues during DN STM and TPR to L UT, lats, rhomboids, LS and infraspinatus/teres Trigger Point Dry-Needling  Treatment instructions: Expect mild to moderate muscle soreness. S/S of pneumothorax if dry needled over a lung field, and to seek immediate medical attention should they occur. Patient verbalized understanding of these instructions and education. Patient Consent Given: Yes Education handout provided: Previously provided Muscles treated: L pectorals, IS, lats, LS, UT, subscapularis Electrical stimulation performed: No Parameters: N/A Treatment response/outcome: Twitch Response Elicited and Palpable Increase in Muscle Length Standing: Shelf reaches flexion L 2x10 Flex/scaption/ABD x 10 ea 1# wt Robber x 10, then ER x 20 with back against foam roller Wall ball with red plyo circles x 20, up/down x 10, side/side x 10   02/08/23 UBE L4 x 6 min Supine: hor ABD GTB 2 x 10  ER RTB x 10 then L only x 10 B towel under L arm ER /IR "W" position 2# 2x10 B ER at (almost) 90 deg ABD 2# 2 x 10 B SDLY : L ER 1 x 10, 1x25 (second set with 3 sec hold) ABD 1 x 10, 1# wt 1 x12 L horizontal ABD x 5, 1# wt x 15 Standing: Shelf reaches flexion L 2x10  BATCA Low Rows 35# 2x 10 High rows 25# 2x10 Chest press 20# 2x Bow draw back 5# x 11 L only  Manual: IASTM to left pectorals  02/01/23 UBE L4 x 6 min Supine: hor ABD RTB x 10  ER RTB x 10 then L only x 10 B towel under L arm ER /IR "W" position 2# 2x10 B ER at (almost) 90 deg ABD 2# 2 x 10 B L serratus push x 10 some discomfort (painful with wt) SDLY : L ER 2 x 10 L  Horizontal ABD 2 x 10 L ABD thumb up x 5 painful today Standing: L punch RTB x 20 Flex/Scaption/ABD to shoulder height thumbs up x 10 ea 1# for Flex and ABD/no wt scaption Bent over T x 20 L no wt Shelf reaches flexion L x 5 painful today  BATCA Low Rows 25# 2x 10 High rows 25# 2x10 Lat pull 20# x 20 Bow draw back 5# x 10 L only   PATIENT EDUCATION: Education details: HEP update' Person educated: Patient Education method: Explanation, Demonstration, and Handouts Education comprehension: verbalized understanding and returned demonstration  HOME EXERCISE PROGRAM: Access Code: [email or web address] URL: [email or web address] Date: 02/10/2023 Prepared by: Raynelle Fanning  Exercises - Isometric Shoulder Abduction - Arm Straight at Wall (Mirrored)  - 2 x daily - 7 x weekly - 1 sets - 5 reps - 10 sec  hold - Standing Isometric Shoulder External Rotation with Doorway (Mirrored)  - 2 x daily - 7 x weekly - 1 sets -  5 reps - 10 sec  hold - Single Arm Punch with Resistance  - 1 x daily - 7 x weekly - 1-3 sets - 10 reps - Single Arm Shoulder Extension with Resistance  - 1 x daily - 7 x weekly - 1-3 sets - 10 reps - Shoulder Internal Rotation with Resistance  - 1 x daily - 7 x weekly - 1-3 sets - 10 reps - Supine Shoulder Horizontal Abduction with Resistance  - 1 x daily - 3 x weekly - 3 sets - 10 reps - Sidelying Shoulder External Rotation  - 1 x daily - 3 x weekly - 3 sets - 10 reps - Sidelying Shoulder Horizontal Abduction  - 1 x daily - 3 x weekly - 1-3 sets - 10 reps - Standing Shoulder Flexion to 90 Degrees  - 1 x daily - 3 x weekly - 1-3 sets - 10 reps - Standing Shoulder Scaption  - 1 x daily - 3 x weekly - 1-3 sets - 10 reps - Shoulder Abduction - Thumbs Up  - 1 x daily - 3 x weekly - 1-3 sets - 10 reps - Standing Wall Ball Circles with Plyo Ball  - 1 x daily - 7 x weekly - 2 sets - 10 reps  DO LYING ON YOUR BACK   Robber: Scapular Retraction: Elbow Flexion (Standing)   With elbows  bent to 90, pinch shoulder blades together and rotate arms out, keeping elbows bent. Repeat _10___ times per set. Do _1-3___ sets per session. Do __1__ sessions per day. May use __2___ pound weights.    ASSESSMENT:  CLINICAL IMPRESSION: Jeffrey [name] reports no pain today and was able to tolerate exercises without increased pain except for standing scaption. ER is the most difficult, but this is progressing as well. He is able to reach out and get his coffee in the morning now without difficulty.     OBJECTIVE IMPAIRMENTS: decreased ROM, decreased strength, increased muscle spasms, impaired flexibility, impaired UE functional use, postural dysfunction, and pain.   ACTIVITY LIMITATIONS: carrying, lifting, and reach over head  PARTICIPATION LIMITATIONS: occupation  PERSONAL FACTORS: 3+ comorbidities: HTN, OA, scoliosis  are also affecting patient's functional outcome.   REHAB POTENTIAL: Good  CLINICAL DECISION MAKING: Evolving/moderate complexity  EVALUATION COMPLEXITY: Low  GOALS: Goals reviewed with patient? Yes  SHORT TERM GOALS: Target date: 01/27/2023   Patient will be independent with initial HEP.  Baseline:  Goal status: MET    LONG TERM GOALS: Target date: 03/10/2023   Patient will be independent with advanced/ongoing HEP to improve outcomes and carryover.  Baseline:  Goal status: IN PROGRESS  2.  Patient will report 75% improvement in L shoulder pain to improve QOL.  Baseline:  Goal status: IN PROGRESS 50% improvement  3.  Patient to demonstrate improved upright posture with posterior shoulder girdle engaged to promote improved glenohumeral joint mobility. Baseline:  Goal status: IN PROGRESS  4.  Patient to improve L shoulder AROM to [facility] without pain provocation to allow for increased ease of ADLs including washing hair and reaching out to drive-thru window.  Baseline: limited with ABD, ER and standing flex Goal status: IN PROGRESS 02/08/23 able to wash hair without  pain; drive thru motion no pain today.  5.  Patient will demonstrate improved UE strength to 4+/5. Baseline:  Goal status: IN PROGRESS partially met 02/08/23  6  Patient will report 10 on Quick Dash to demonstrate improved functional ability.  Baseline:  20.5 / 100 = 20.5 %  PLAN: PT FREQUENCY: 2x/week  PT DURATION:  12 sessions  PLANNED INTERVENTIONS: Therapeutic exercises, Therapeutic activity, Neuromuscular re-education, Patient/Family education, Self Care, Joint mobilization, Dry Needling, Electrical stimulation, Spinal mobilization, Cryotherapy, Moist heat, Taping, Ultrasound, Ionotophoresis /ml Dexamethasone, and Manual therapy  PLAN FOR NEXT SESSION: continue shoulder and scapular strength, chest opening,shoulder ROM   Anothy Bufano, PT 02/15/2023, 1:45 PM

## 2023-02-15 ENCOUNTER — Ambulatory Visit: Payer: Medicare Other | Admitting: Physical Therapy

## 2023-02-15 ENCOUNTER — Encounter: Payer: Self-pay | Admitting: Physical Therapy

## 2023-02-15 DIAGNOSIS — M25512 Pain in left shoulder: Secondary | ICD-10-CM

## 2023-02-15 DIAGNOSIS — M25612 Stiffness of left shoulder, not elsewhere classified: Secondary | ICD-10-CM

## 2023-02-15 DIAGNOSIS — R252 Cramp and spasm: Secondary | ICD-10-CM

## 2023-02-16 NOTE — Therapy (Signed)
OUTPATIENT PHYSICAL THERAPY UPPER EXTREMITY TREATMENT   Patient Name: Jeffrey [name] MRN: [id number] DOB:[birth year], 72 y.o., male Today's Date: 02/17/2023  END OF SESSION:  PT End of Session - 02/17/23 1257     Visit Number 8    Number of Visits 12    Date for PT Re-Evaluation 03/10/23    Authorization Type MCR    Progress Note Due on Visit 10    PT Start Time 1300    PT Stop Time 1347    PT Time Calculation (min) 47 min    Activity Tolerance Patient tolerated treatment well    Behavior During Therapy WFL for tasks assessed/performed               Past Medical History:  Diagnosis Date   Anxiety    Arthritis    knees   Blood transfusion without reported diagnosis    BPH (benign prostatic hypertrophy)    Cataract    "beginnings of"   Chronic kidney disease    kidney infection   Depression    GERD (gastroesophageal reflux disease)    Hypertension    Meralgia paresthetica of right side    SCCA (squamous cell carcinoma) of skin 11/05/2020   Left Thigh-Anterior (in situ) (curet and 5FU)   Past Surgical History:  Procedure Laterality Date   HERNIA REPAIR     inguinal, umbilical   SPLENECTOMY     Patient Active Problem List   Diagnosis Date Noted   Bilateral carotid bruits 01/13/2023   AC (acromioclavicular) arthritis 12/24/2022   Left rotator cuff tear 10/13/2022   Degenerative arthritis of knee 02/25/2021   Coronary artery disease due to lipid rich plaque 01/13/2021   Renal mass, right 12/17/2020   RLQ abdominal pain 11/08/2020   Umbilical hernia 11/08/2020   Neoplasm of uncertain behavior of skin 05/01/2020   Trigger finger of left hand 05/01/2020   Dyslipidemia 07/20/2019   Aortic atherosclerosis 04/14/2018   Right rotator cuff tear 01/13/2018   Abdominal pain 03/18/2016   Cervical disc disorder with radiculopathy of cervical region 12/06/2015   Edema 12/06/2015   Lower back pain 07/04/2015   Injury of popliteal region 12/07/2014   Right knee pain  11/13/2013   Pneumonia 12/21/2012   Fatigue 12/21/2012   Dry mouth 12/21/2012   Actinic keratoses 09/09/2012   Well adult exam 06/25/2011   History of splenectomy 06/25/2011   PSA elevation 06/25/2011   Ventral hernia 05/23/2010   Meralgia paresthetica 03/19/2009   Anxiety state 06/25/2007   ERECTILE DYSFUNCTION 06/25/2007   DEPRESSION 06/25/2007   Essential hypertension 06/25/2007   GERD 06/25/2007   BPH (benign prostatic hyperplasia) 06/25/2007    PCP: Tresa Garter, MD   REFERRING PROVIDER: Judi Saa, DO   REFERRING DIAG: [phone] (ICD-10-CM) - Acute pain of left shoulder   THERAPY DIAG:  Acute pain of left shoulder  Cramp and spasm  Stiffness of left shoulder, not elsewhere classified  Rationale for Evaluation and Treatment: Rehabilitation  ONSET DATE: December 2023  SUBJECTIVE:  SUBJECTIVE STATEMENT: Still getting twinges here and there with movement. It's not waking me from sleeping once I'm in a good position.  PERTINENT HISTORY: Scoliosis, HTN, OA  PAIN:  Are you having pain? Yes: NPRS scale: 0 to 4-5/10 with twinges/10 Pain location: left shoulder and down to shoulder blade Pain description: sharp Aggravating factors: horizontal ABD, OH Relieving factors: Aldara cream  PRECAUTIONS: None  WEIGHT BEARING RESTRICTIONS: No  FALLS:  Has patient fallen in last 6 months? No  LIVING ENVIRONMENT: Lives with: lives with their spouse Lives in: House/apartment Stairs:  N/A Has following equipment at home:  N/A  OCCUPATION: Works part time as Merchandiser, retail at [employer]  PLOF: Independent  PATIENT GOALS: get it as good as the right  NEXT MD VISIT: May   OBJECTIVE:   DIAGNOSTIC FINDINGS:  XR 10/14/22 IMPRESSION: 1. Mild glenohumeral and  acromioclavicular osteoarthritis. 2. Likely old healed fracture of the left clavicle midshaft.  PATIENT SURVEYS :  Quick Dash  20.5 / 100 = 20.5 %  COGNITION: Overall cognitive status: Within functional limits for tasks assessed     SENSATION: WFL  POSTURE: Rounded shoulders, Fwd head, scoliosis resulting in depressed left shoulder and scap  UPPER EXTREMITY ROM:   A/P ROM Right eval Left eval Left  01/25/23 Left 02/08/23  Shoulder flexion  180  120 standing  Shoulder extension  FULL    Shoulder abduction  125/150 150/180 125 standing  Shoulder adduction      Shoulder internal rotation  84/90 80   Shoulder external rotation  38/55 75   Elbow flexion  FULL    Elbow extension  FULL    (Blank rows = not tested)  UPPER EXTREMITY MMT:  MMT Right eval Left eval Left  02/08/23  Shoulder flexion  4-* 4+  Shoulder extension  4+ 5  Shoulder abduction  3+* 4+  Shoulder adduction     Shoulder internal rotation  4 4+  Shoulder external rotation  3+ 3+  Middle trapezius     Lower trapezius     Elbow flexion  5   Elbow extension  5   (Blank rows = not tested) * = PAIN (mild with ABD and ER)  SHOULDER SPECIAL TESTS: Rotator cuff assessment: Drop arm test: negative, Empty can test: negative, Full can test: positive , and Gerber lift off test: positive    JOINT MOBILITY TESTING:  WNL  PALPATION:  L pects, UT, subscap, lats, medial scapular mm.   TODAY'S TREATMENT:                                                                                                                                         DATE:   02/17/23 UBE L5 x 6 min Standing: Flex/scaption/ABD 2x10 ea no weight Shelf reaches flexion with yellow loop around wrists x 10 (hurts back) Seated  B flexion with yellow loop 2x10 horizontal  ABD  stopped due to pain Supine: Hands behind head pec stretch x 30 sec B Horizontal ABD 2x10 Diagonals R x 20 RTB (D2 flex with rotation) PNF L shoulder  D2 x 10 with PT  resistance SDLY : L ER 2 x 5 3 sec tempo L Horizontal ABD 1 x 10 2#  L ABD thumb up x 9 2# stopped due to fatigue  Modalities: Ionotophoresis with 1.0 mL 4mg /ml Dexamethasone - 4-6 hour patch (83mA-min) to L ant shoulder (patch #1 of 6)   02/15/23 UBE L4 x 6 min Seated  horizontal ABD  RTB x 20 Supine: ER RTB x 21 Diagonals R x 20 RTB (D2 flex) Reverse diagonal with 2# weight (D2 ext) - did not focus on rotational component today ER /IR "W" position 2# 2x10 B ER at (almost) 90 deg ABD 2# 2 x 10 B SDLY : L ER 2 x 10 3 sec tempo L Horizontal ABD 1 x 10 2# L ABD thumb up x 10 Standing: Shelf reaches flexion 1# L 1x10 fatigues Flex/scaption/ x 10 ea 1# wt with flexion only; fatigued with scaption today and started to hurt Wall ball with red plyo circles x 20, up/down x 10 Bent over T x 20 L 1#  BATCA Low Rows 25# 2x 10 High rows 25# 2x10 Lat pull 25# x 20   02/10/23 Manual Therapy: to decrease muscle spasm and pain and improve mobility Skilled palpation and monitoring of soft tissues during DN STM and TPR to L UT, lats, rhomboids, LS and infraspinatus/teres Trigger Point Dry-Needling  Treatment instructions: Expect mild to moderate muscle soreness. S/S of pneumothorax if dry needled over a lung field, and to seek immediate medical attention should they occur. Patient verbalized understanding of these instructions and education. Patient Consent Given: Yes Education handout provided: Previously provided Muscles treated: L pectorals, IS, lats, LS, UT, subscapularis Electrical stimulation performed: No Parameters: N/A Treatment response/outcome: Twitch Response Elicited and Palpable Increase in Muscle Length Standing: Shelf reaches flexion L 2x10 Flex/scaption/ABD x 10 ea 1# wt Robber x 10, then ER x 20 with back against foam roller Wall ball with red plyo circles x 20, up/down x 10, side/side x 10   PATIENT EDUCATION: Education details: HEP update' Person educated:  Patient Education method: Explanation, Demonstration, and Handouts Education comprehension: verbalized understanding and returned demonstration  HOME EXERCISE PROGRAM: Access Code: [email or web address] URL: [email or web address] Date: 02/17/2023 Prepared by: Raynelle Fanning  Exercises - Isometric Shoulder Abduction - Arm Straight at Wall (Mirrored)  - 2 x daily - 7 x weekly - 1 sets - 5 reps - 10 sec  hold - Standing Isometric Shoulder External Rotation with Doorway (Mirrored)  - 2 x daily - 7 x weekly - 1 sets - 5 reps - 10 sec  hold - Single Arm Punch with Resistance  - 1 x daily - 7 x weekly - 1-3 sets - 10 reps - Single Arm Shoulder Extension with Resistance  - 1 x daily - 7 x weekly - 1-3 sets - 10 reps - Shoulder Internal Rotation with Resistance  - 1 x daily - 7 x weekly - 1-3 sets - 10 reps - Supine Shoulder Horizontal Abduction with Resistance  - 1 x daily - 3 x weekly - 3 sets - 10 reps - Sidelying Shoulder External Rotation  - 1 x daily - 3 x weekly - 3 sets - 10 reps - Sidelying Shoulder Horizontal Abduction  - 1 x daily - 3 x weekly - 1-3  sets - 10 reps - Standing Shoulder Flexion to 90 Degrees  - 1 x daily - 3 x weekly - 1-3 sets - 10 reps - Standing Shoulder Scaption  - 1 x daily - 3 x weekly - 1-3 sets - 10 reps - Shoulder Abduction - Thumbs Up  - 1 x daily - 3 x weekly - 1-3 sets - 10 reps - Standing Wall Ball Circles with Plyo Ball  - 1 x daily - 7 x weekly - 2 sets - 10 reps - Seated Shoulder Flexion Full Range with yellow loop  - 1 x daily - 4 x weekly - 1-3 sets - 10 reps  DO LYING ON YOUR BACK   Robber: Scapular Retraction: Elbow Flexion (Standing)   With elbows bent to 90, pinch shoulder blades together and rotate arms out, keeping elbows bent. Repeat _10___ times per set. Do _1-3___ sets per session. Do __1__ sessions per day. May use __2___ pound weights.    ASSESSMENT:  CLINICAL IMPRESSION: Kathlene November still feeling twinges of pain with certain movements. He is tender  to palpation over anterior L shoulder and still experiences pain with scaption and ABDuction. We did these first today which helped and he was able to tolerate shoulder flexion better with use of loop. He continues to fatigue quickly primarily with scaption and ER. Trial of ionto to ant shoulder to see if this will help with pain.     OBJECTIVE IMPAIRMENTS: decreased ROM, decreased strength, increased muscle spasms, impaired flexibility, impaired UE functional use, postural dysfunction, and pain.   ACTIVITY LIMITATIONS: carrying, lifting, and reach over head  PARTICIPATION LIMITATIONS: occupation  PERSONAL FACTORS: 3+ comorbidities: HTN, OA, scoliosis  are also affecting patient's functional outcome.   REHAB POTENTIAL: Good  CLINICAL DECISION MAKING: Evolving/moderate complexity  EVALUATION COMPLEXITY: Low  GOALS: Goals reviewed with patient? Yes  SHORT TERM GOALS: Target date: 01/27/2023   Patient will be independent with initial HEP.  Baseline:  Goal status: MET    LONG TERM GOALS: Target date: 03/10/2023   Patient will be independent with advanced/ongoing HEP to improve outcomes and carryover.  Baseline:  Goal status: IN PROGRESS  2.  Patient will report 75% improvement in L shoulder pain to improve QOL.  Baseline:  Goal status: IN PROGRESS 50% improvement  3.  Patient to demonstrate improved upright posture with posterior shoulder girdle engaged to promote improved glenohumeral joint mobility. Baseline:  Goal status: IN PROGRESS  4.  Patient to improve L shoulder AROM to [facility] without pain provocation to allow for increased ease of ADLs including washing hair and reaching out to drive-thru window.  Baseline: limited with ABD, ER and standing flex Goal status: IN PROGRESS 02/08/23 able to wash hair without pain; drive thru motion no pain today.  5.  Patient will demonstrate improved UE strength to 4+/5. Baseline:  Goal status: IN PROGRESS partially met 02/08/23  6   Patient will report 10 on Quick Dash to demonstrate improved functional ability.  Baseline:  20.5 / 100 = 20.5 %  PLAN: PT FREQUENCY: 2x/week  PT DURATION:  12 sessions  PLANNED INTERVENTIONS: Therapeutic exercises, Therapeutic activity, Neuromuscular re-education, Patient/Family education, Self Care, Joint mobilization, Dry Needling, Electrical stimulation, Spinal mobilization, Cryotherapy, Moist heat, Taping, Ultrasound, Ionotophoresis /ml Dexamethasone, and Manual therapy  PLAN FOR NEXT SESSION: Assess ionto and continue if indicated, continue shoulder and scapular strength, chest opening,shoulder ROM   Bellamy Judson, PT 02/17/2023, 2:45 PM

## 2023-02-17 ENCOUNTER — Ambulatory Visit: Payer: Medicare Other | Admitting: Physical Therapy

## 2023-02-17 ENCOUNTER — Encounter: Payer: Self-pay | Admitting: Physical Therapy

## 2023-02-17 ENCOUNTER — Telehealth: Payer: Self-pay

## 2023-02-17 DIAGNOSIS — M25612 Stiffness of left shoulder, not elsewhere classified: Secondary | ICD-10-CM | POA: Diagnosis not present

## 2023-02-17 DIAGNOSIS — M25512 Pain in left shoulder: Secondary | ICD-10-CM | POA: Diagnosis not present

## 2023-02-17 DIAGNOSIS — R252 Cramp and spasm: Secondary | ICD-10-CM

## 2023-02-17 NOTE — Telephone Encounter (Signed)
Contacted [name] to schedule their annual wellness visit. Appointment made for 02/18/23.  Agnes Lawrence, CMA (AAMA)  [facility] [phone]

## 2023-02-18 ENCOUNTER — Ambulatory Visit (INDEPENDENT_AMBULATORY_CARE_PROVIDER_SITE_OTHER): Payer: Medicare Other

## 2023-02-18 ENCOUNTER — Telehealth: Payer: Self-pay

## 2023-02-18 VITALS — Ht 72.0 in | Wt 248.0 lb

## 2023-02-18 DIAGNOSIS — Z0000 Encounter for general adult medical examination without abnormal findings: Secondary | ICD-10-CM

## 2023-02-18 MED ORDER — TAMSULOSIN HCL 0.4 MG PO CAPS: 0.4000 mg | ORAL_CAPSULE | Freq: Every day | ORAL | 1 refills | Status: DC

## 2023-02-18 NOTE — Patient Instructions (Signed)
[name] , Thank you for taking time to come for your Medicare Wellness Visit. I appreciate your ongoing commitment to your health goals. Please review the following plan we discussed and let me know if I can assist you in the future.   These are the goals we discussed:  Goals      My goal for 2024 is to lose 40 pounds by watching my diet and continuing with therapy.        This is a list of the screening recommended for you and due dates:  Health Maintenance  Topic Date Due   COVID-19 Vaccine (3 - Pfizer risk series) 02/22/2020   Flu Shot  05/27/2023   DTaP/Tdap/Td vaccine (2 - Td or Tdap) 12/07/2023   Medicare Annual Wellness Visit  02/18/2024   Colon Cancer Screening  08/26/2025   Pneumonia Vaccine  Completed   Hepatitis C Screening: USPSTF Recommendation to screen - Ages 18-79 yo.  Completed   Zoster (Shingles) Vaccine  Completed   HPV Vaccine  Aged Out    Advanced directives: Yes; Please bring a copy of your health care power of attorney and living will to the office at your convenience.  Conditions/risks identified: Yes  Next appointment: Follow up in one year for your annual wellness visit.   Preventive Care 73 Years and Older, Male  Preventive care refers to lifestyle choices and visits with your health care provider that can promote health and wellness. What does preventive care include? A yearly physical exam. This is also called an annual well check. Dental exams once or twice a year. Routine eye exams. Ask your health care provider how often you should have your eyes checked. Personal lifestyle choices, including: Daily care of your teeth and gums. Regular physical activity. Eating a healthy diet. Avoiding tobacco and drug use. Limiting alcohol use. Practicing safe sex. Taking low doses of aspirin every day. Taking vitamin and mineral supplements as recommended by your health care provider. What happens during an annual well check? The services and  screenings done by your health care provider during your annual well check will depend on your age, overall health, lifestyle risk factors, and family history of disease. Counseling  Your health care provider may ask you questions about your: Alcohol use. Tobacco use. Drug use. Emotional well-being. Home and relationship well-being. Sexual activity. Eating habits. History of falls. Memory and ability to understand (cognition). Work and work Astronomer. Screening  You may have the following tests or measurements: Height, weight, and BMI. Blood pressure. Lipid and cholesterol levels. These may be checked every 5 years, or more frequently if you are over 73 years old. Skin check. Lung cancer screening. You may have this screening every year starting at age 73 if you have a 30-pack-year history of smoking and currently smoke or have quit within the past 15 years. Fecal occult blood test (FOBT) of the stool. You may have this test every year starting at age 73. Flexible sigmoidoscopy or colonoscopy. You may have a sigmoidoscopy every 5 years or a colonoscopy every 10 years starting at age 73. Prostate cancer screening. Recommendations will vary depending on your family history and other risks. Hepatitis C blood test. Hepatitis B blood test. Sexually transmitted disease (STD) testing. Diabetes screening. This is done by checking your blood sugar (glucose) after you have not eaten for a while (fasting). You may have this done every 1-3 years. Abdominal aortic aneurysm (AAA) screening. You may need this if you are a current or  former smoker. Osteoporosis. You may be screened starting at age 73 if you are at high risk. Talk with your health care provider about your test results, treatment options, and if necessary, the need for more tests. Vaccines  Your health care provider may recommend certain vaccines, such as: Influenza vaccine. This is recommended every year. Tetanus, diphtheria, and  acellular pertussis (Tdap, Td) vaccine. You may need a Td booster every 10 years. Zoster vaccine. You may need this after age 73. Pneumococcal 13-valent conjugate (PCV13) vaccine. One dose is recommended after age 73. Pneumococcal polysaccharide (PPSV23) vaccine. One dose is recommended after age 14. Talk to your health care provider about which screenings and vaccines you need and how often you need them. This information is not intended to replace advice given to you by your health care provider. Make sure you discuss any questions you have with your health care provider. Document Released: 11/08/2015 Document Revised: 07/01/2016 Document Reviewed: 08/13/2015 Elsevier Interactive Patient Education  2017 [place] Prevention in the Home Falls can cause injuries. They can happen to people of all ages. There are many things you can do to make your home safe and to help prevent falls. What can I do on the outside of my home? Regularly fix the edges of walkways and driveways and fix any cracks. Remove anything that might make you trip as you walk through a door, such as a raised step or threshold. Trim any bushes or trees on the path to your home. Use bright outdoor lighting. Clear any walking paths of anything that might make someone trip, such as rocks or tools. Regularly check to see if handrails are loose or broken. Make sure that both sides of any steps have handrails. Any raised decks and porches should have guardrails on the edges. Have any leaves, snow, or ice cleared regularly. Use sand or salt on walking paths during winter. Clean up any spills in your garage right away. This includes oil or grease spills. What can I do in the bathroom? Use night lights. Install grab bars by the toilet and in the tub and shower. Do not use towel bars as grab bars. Use non-skid mats or decals in the tub or shower. If you need to sit down in the shower, use a plastic, non-slip stool. Keep  the floor dry. Clean up any water that spills on the floor as soon as it happens. Remove soap buildup in the tub or shower regularly. Attach bath mats securely with double-sided non-slip rug tape. Do not have throw rugs and other things on the floor that can make you trip. What can I do in the bedroom? Use night lights. Make sure that you have a light by your bed that is easy to reach. Do not use any sheets or blankets that are too big for your bed. They should not hang down onto the floor. Have a firm chair that has side arms. You can use this for support while you get dressed. Do not have throw rugs and other things on the floor that can make you trip. What can I do in the kitchen? Clean up any spills right away. Avoid walking on wet floors. Keep items that you use a lot in easy-to-reach places. If you need to reach something above you, use a strong step stool that has a grab bar. Keep electrical cords out of the way. Do not use floor polish or wax that makes floors slippery. If you must use wax, use  non-skid floor wax. Do not have throw rugs and other things on the floor that can make you trip. What can I do with my stairs? Do not leave any items on the stairs. Make sure that there are handrails on both sides of the stairs and use them. Fix handrails that are broken or loose. Make sure that handrails are as long as the stairways. Check any carpeting to make sure that it is firmly attached to the stairs. Fix any carpet that is loose or worn. Avoid having throw rugs at the top or bottom of the stairs. If you do have throw rugs, attach them to the floor with carpet tape. Make sure that you have a light switch at the top of the stairs and the bottom of the stairs. If you do not have them, ask someone to add them for you. What else can I do to help prevent falls? Wear shoes that: Do not have high heels. Have rubber bottoms. Are comfortable and fit you well. Are closed at the toe. Do not  wear sandals. If you use a stepladder: Make sure that it is fully opened. Do not climb a closed stepladder. Make sure that both sides of the stepladder are locked into place. Ask someone to hold it for you, if possible. Clearly mark and make sure that you can see: Any grab bars or handrails. First and last steps. Where the edge of each step is. Use tools that help you move around (mobility aids) if they are needed. These include: Canes. Walkers. Scooters. Crutches. Turn on the lights when you go into a dark area. Replace any light bulbs as soon as they burn out. Set up your furniture so you have a clear path. Avoid moving your furniture around. If any of your floors are uneven, fix them. If there are any pets around you, be aware of where they are. Review your medicines with your doctor. Some medicines can make you feel dizzy. This can increase your chance of falling. Ask your doctor what other things that you can do to help prevent falls. This information is not intended to replace advice given to you by your health care provider. Make sure you discuss any questions you have with your health care provider. Document Released: 08/08/2009 Document Revised: 03/19/2016 Document Reviewed: 11/16/2014 Elsevier Interactive Patient Education  2017 [employer].

## 2023-02-18 NOTE — Telephone Encounter (Signed)
Patient requesting a 90-day supply refill on Flomax to be sent to [facility] on file.

## 2023-02-18 NOTE — Telephone Encounter (Signed)
Rx sent to [facility].../lmb 

## 2023-02-18 NOTE — Progress Notes (Addendum)
I connected with  Jeffrey [name] on 02/18/23 by a audio enabled telemedicine application and verified that I am speaking with the correct person using two identifiers.  Patient Location: Home  Provider Location: Office/Clinic  I discussed the limitations of evaluation and management by telemedicine. The patient expressed understanding and agreed to proceed.  Subjective:   Jeffrey [name] is a 72 y.o. male who presents for Medicare Annual/Subsequent preventive examination.  Review of Systems     Cardiac Risk Factors include: advanced age (>37men, >41 women);dyslipidemia;hypertension;family history of premature cardiovascular disease;male gender;obesity (BMI >30kg/m2)     Objective:    Today's Vitals   02/18/23 1334 02/18/23 1335  Weight: 248 lb (112.5 kg)   Height: 6' (1.829 m)   PainSc: 3  3   PainLoc: Shoulder    Body mass index is 33.63 kg/m.     02/18/2023    1:38 PM 01/13/2023    1:01 PM 12/31/2021    3:17 PM 12/30/2020    1:47 PM 04/14/2018    2:31 PM 02/15/2018    1:06 PM 03/10/2017    5:32 PM  Advanced Directives  Does Patient Have a Medical Advance Directive? Yes Yes Yes Yes Yes Yes No  Type of Estate agent of [place];Living will Healthcare Power of [place];Living will Living will;Healthcare Power of [employer] Power of [place];Living will Healthcare Power of [place];Living will    Does patient want to make changes to medical advance directive?  No - Patient declined No - Patient declined No - Patient declined  No - Patient declined   Copy of Healthcare Power of Attorney in Chart? No - copy requested No - copy requested No - copy requested No - copy requested No - copy requested    Would patient like information on creating a medical advance directive?       Yes (ED - Information included in AVS)    Current Medications (verified) Outpatient Encounter Medications as of 02/18/2023  Medication Sig   aspirin 81 MG tablet Take 81 mg by  mouth daily.   B Complex-Folic Acid (B COMPLEX PLUS) TABS Take 1 tablet by mouth daily.   celecoxib (CELEBREX) 100 MG capsule TAKE 1 CAPSULE(100 MG) BY MOUTH DAILY   Cholecalciferol (VITAMIN D3) 2000 units TABS Take 1 tablet by mouth.   finasteride (PROSCAR) 5 MG tablet Take 1 tablet (5 mg total) by mouth daily. Overdue for yearly physical w/labs must see MF for refills   Fluorouracil (TOLAK) 4 % CREA Apply 1 application. topically daily. Qhs x 28 days   imiquimod (ALDARA) 5 % cream Apply to thigh 3 nights a wk for 8 wks   Omega-3 Fatty Acids (FISH OIL) 1000 MG CAPS Take 1 capsule by mouth daily.   omeprazole (PRILOSEC) 20 MG capsule Take 20 mg by mouth daily.   rosuvastatin (CRESTOR) 5 MG tablet Take 1 tablet (5 mg total) by mouth daily.   tamsulosin (FLOMAX) 0.4 MG CAPS capsule Take 1 capsule (0.4 mg total) by mouth daily. Overdue for Annual appt must see provider for future refills   telmisartan (MICARDIS) 80 MG tablet TAKE 1 TABLET  BY MOUTH DAILY   triamterene-hydrochlorothiazide (MAXZIDE-25) 37.5-25 MG tablet Take 1 tablet by mouth daily.   No facility-administered encounter medications on file as of 02/18/2023.    Allergies (verified) Bupropion hcl, Lovastatin, Statins, and Venlafaxine   History: Past Medical History:  Diagnosis Date   Anxiety    Arthritis    knees   Blood  transfusion without reported diagnosis    BPH (benign prostatic hypertrophy)    Cataract    "beginnings of"   Chronic kidney disease    kidney infection   Depression    GERD (gastroesophageal reflux disease)    Hypertension    Meralgia paresthetica of right side    SCCA (squamous cell carcinoma) of skin 11/05/2020   Left Thigh-Anterior (in situ) (curet and 5FU)   Past Surgical History:  Procedure Laterality Date   HERNIA REPAIR     inguinal, umbilical   SPLENECTOMY     Family History  Problem Relation Age of Onset   Heart disease Mother 22       chf   Hypertension Other    Colon cancer  Neg Hx    Esophageal cancer Neg Hx    Stomach cancer Neg Hx    Rectal cancer Neg Hx    Social History   Socioeconomic History   Marital status: Married    Spouse name: Not on file   Number of children: 2   Years of education: 12   Highest education level: 12th grade  Occupational History   Not on file  Tobacco Use   Smoking status: Never   Smokeless tobacco: Never  Vaping Use   Vaping Use: Never used  Substance and Sexual Activity   Alcohol use: Yes    Alcohol/week: 0.0 standard drinks of alcohol    Comment: occasional wine   Drug use: No   Sexual activity: Yes  Other Topics Concern   Not on file  Social History Narrative   Regular exercise-Yes (goes to the gym 3 times per week for 60 minutes)   Social Determinants of Health   Financial Resource Strain: Low Risk  (02/18/2023)   Overall Financial Resource Strain (CARDIA)    Difficulty of Paying Living Expenses: Not hard at all  Food Insecurity: No Food Insecurity (02/18/2023)   Hunger Vital Sign    Worried About Running Out of Food in the Last Year: Never true    Ran Out of Food in the Last Year: Never true  Transportation Needs: No Transportation Needs (02/18/2023)   PRAPARE - Administrator, Civil Service (Medical): No    Lack of Transportation (Non-Medical): No  Physical Activity: Sufficiently Active (02/18/2023)   Exercise Vital Sign    Days of Exercise per Week: 3 days    Minutes of Exercise per Session: 60 min  Stress: No Stress Concern Present (02/18/2023)   [employer] of Occupational Health - Occupational Stress Questionnaire    Feeling of Stress : Not at all  Social Connections: Socially Integrated (02/18/2023)   Social Connection and Isolation Panel [NHANES]    Frequency of Communication with Friends and Family: More than three times a week    Frequency of Social Gatherings with Friends and Family: More than three times a week    Attends Religious Services: More than 4 times per year     Active Member of [employer] or Organizations: Yes    Attends Engineer, structural: More than 4 times per year    Marital Status: Married    Tobacco Counseling Counseling given: Not Answered   Clinical Intake:  Pre-visit preparation completed: Yes  Pain : 0-10 Pain Score: 3  Pain Type: Chronic pain     Nutritional Risks: None Diabetes: No  How often do you need to have someone help you when you read instructions, pamphlets, or other written materials from your doctor or pharmacy?:  1 - Never What is the last grade level you completed in school?: HSG  Diabetic? No  Interpreter Needed?: No  Information entered by :: Susie Cassette, LPN.   Activities of Daily Living    02/18/2023    1:39 PM  In your present state of health, do you have any difficulty performing the following activities:  Hearing? 1  Vision? 0  Difficulty concentrating or making decisions? 0  Walking or climbing stairs? 0  Dressing or bathing? 0  Doing errands, shopping? 0  Preparing Food and eating ? N  Using the Toilet? N  In the past six months, have you accidently leaked urine? N  Do you have problems with loss of bowel control? N  Managing your Medications? N  Managing your Finances? N  Housekeeping or managing your Housekeeping? N    Patient Care Team: Plotnikov, Georgina Quint, MD as PCP - General Marcine Matar, MD as Attending Physician (Urology) Janalyn Harder, MD (Inactive) as Consulting Physician (Dermatology)  Indicate any recent Medical Services you may have received from other than [facility] providers in the past year (date may be approximate).     Assessment:   This is a routine wellness examination for [place].  Hearing/Vision screen Hearing Screening - Comments:: Patient has 30% hearing loss in left ear; no hearing aids. Vision Screening - Comments:: Wears rx glasses - up to date with routine eye exams with [facility].   Dietary issues and exercise activities  discussed: Current Exercise Habits: Home exercise routine;The patient has a physically strenuous job, but has no regular exercise apart from work., Type of exercise: walking;treadmill;stretching;strength training/weights, Time (Minutes): 60, Frequency (Times/Week): 3, Weekly Exercise (Minutes/Week): 180, Intensity: Moderate, Exercise limited by: orthopedic condition(s)   Goals Addressed             This Visit's Progress    My goal for 2024 is to lose 40 pounds by watching my diet and continuing with therapy.        Depression Screen    02/18/2023    1:39 PM 01/13/2023    9:19 AM 12/31/2021    3:16 PM 12/30/2020    1:45 PM 11/08/2020    8:05 AM 04/14/2018    2:31 PM 03/10/2017    5:12 PM  PHQ 2/9 Scores  PHQ - 2 Score 0 0 0 0 0 0 0  PHQ- 9 Score 0    0 0 0    Fall Risk    02/18/2023    1:39 PM 01/13/2023    9:18 AM 12/31/2021    3:18 PM 12/30/2020    1:48 PM 11/08/2020    8:02 AM  Fall Risk   Falls in the past year? 0 0 0 0 0  Number falls in past yr: 0 0 0 0 0  Injury with Fall? 0 0 0 0 0  Risk for fall due to : No Fall Risks No Fall Risks No Fall Risks No Fall Risks No Fall Risks  Follow up Falls prevention discussed Falls evaluation completed Falls evaluation completed Falls evaluation completed Falls evaluation completed    FALL RISK PREVENTION PERTAINING TO THE HOME:  Any stairs in or around the home? Yes  If so, are there any without handrails? No  Home free of loose throw rugs in walkways, pet beds, electrical cords, etc? Yes  Adequate lighting in your home to reduce risk of falls? Yes   ASSISTIVE DEVICES UTILIZED TO PREVENT FALLS:  Life alert? No  Use of a cane, walker  or w/c? No  Grab bars in the bathroom? Yes  Shower chair or bench in shower? No  Elevated toilet seat or a handicapped toilet? Yes   TIMED UP AND GO:  Was the test performed? No . Telephonic Visit  Cognitive Function:        02/18/2023    1:39 PM  6CIT Screen  What Year? 0 points  What month?  0 points  What time? 0 points  Count back from 20 0 points  Months in reverse 0 points  Repeat phrase 0 points  Total Score 0 points    Immunizations Immunization History  Administered Date(s) Administered   Fluad Quad(high Dose 65+) 07/20/2019, 09/14/2020   Influenza Whole 10/16/2002, 09/05/2012   Influenza, High Dose Seasonal PF 09/08/2017, 10/12/2018   Influenza,inj,Quad PF,6+ Mos 09/04/2016   Influenza,trivalent, recombinat, inj, PF 08/27/2011   Influenza-Unspecified 09/05/2013, 09/16/2015, 09/14/2022   Meningococcal polysaccharide vaccine (MPSV4) 05/23/2010   PFIZER(Purple Top)SARS-COV-2 Vaccination 01/04/2020, 01/25/2020   PNEUMOCOCCAL CONJUGATE-20 01/13/2023   Pneumococcal Conjugate-13 12/06/2015   Pneumococcal Polysaccharide-23 05/23/2010, 04/14/2018   Tdap 12/06/2013   Zoster Recombinat (Shingrix) 09/21/2022, 11/23/2022    TDAP status: Up to date  Flu Vaccine status: Up to date  Pneumococcal vaccine status: Up to date  Covid-19 vaccine status: Completed vaccines  Qualifies for Shingles Vaccine? Yes   Zostavax completed No   Shingrix Completed?: Yes  Screening Tests Health Maintenance  Topic Date Due   COVID-19 Vaccine (3 - Pfizer risk series) 02/22/2020   INFLUENZA VACCINE  05/27/2023   DTaP/Tdap/Td (2 - Td or Tdap) 12/07/2023   Medicare Annual Wellness (AWV)  02/18/2024   COLONOSCOPY (Pts 45-45yrs Insurance coverage will need to be confirmed)  08/26/2025   Pneumonia Vaccine 1+ Years old  Completed   Hepatitis C Screening  Completed   Zoster Vaccines- Shingrix  Completed   HPV VACCINES  Aged Out    Health Maintenance  Health Maintenance Due  Topic Date Due   COVID-19 Vaccine (3 - Pfizer risk series) 02/22/2020    Colorectal cancer screening: Type of screening: Colonoscopy. Completed 08/27/2015. Repeat every 10 years  Lung Cancer Screening: (Low Dose CT Chest recommended if Age 55-80 years, 30 pack-year currently smoking OR have quit w/in 15years.)  does not qualify.   Lung Cancer Screening Referral: No  Additional Screening:  Hepatitis C Screening: does qualify; Completed 12/09/2015  Vision Screening: Recommended annual ophthalmology exams for early detection of glaucoma and other disorders of the eye. Is the patient up to date with their annual eye exam?  Yes  Who is the provider or what is the name of the office in which the patient attends annual eye exams? [facility] If pt is not established with a provider, would they like to be referred to a provider to establish care? No .   Dental Screening: Recommended annual dental exams for proper oral hygiene  Community Resource Referral / Chronic Care Management: CRR required this visit?  No   CCM required this visit?  No      Plan:     I have personally reviewed and noted the following in the patient's chart:   Medical and social history Use of alcohol, tobacco or illicit drugs  Current medications and supplements including opioid prescriptions. Patient is not currently taking opioid prescriptions. Functional ability and status Nutritional status Physical activity Advanced directives List of other physicians Hospitalizations, surgeries, and ER visits in previous 12 months Vitals Screenings to include cognitive, depression, and falls Referrals and appointments  In addition, I have reviewed and discussed with patient certain preventive protocols, quality metrics, and best practice recommendations. A written personalized care plan for preventive services as well as general preventive health recommendations were provided to patient.     Mickeal Needy, LPN   [id number]/61/0960   Nurse Notes:  Patient is cogitatively intact. There were no vitals filed for this visit. Patient stated that she has no issues with gait or balance; does not use any assistive devices. Medications reviewed with patient; no opioid use noted.  Medical screening examination/treatment/procedure(s)  were performed by non-physician practitioner and as supervising physician I was immediately available for consultation/collaboration.  I agree with above. Jacinta Shoe, MD

## 2023-02-22 ENCOUNTER — Ambulatory Visit: Payer: Medicare Other | Admitting: Physical Therapy

## 2023-02-23 NOTE — Therapy (Signed)
OUTPATIENT PHYSICAL THERAPY UPPER EXTREMITY TREATMENT   Patient Name: Jeffrey [name] MRN: [id number] DOB:June 11, 1951, 72 y.o., male Today's Date: 02/24/2023  END OF SESSION:  PT End of Session - 02/24/23 1303     Visit Number 9    Number of Visits 12    Date for PT Re-Evaluation 03/10/23    Authorization Type MCR    Progress Note Due on Visit 10    PT Start Time 1300    PT Stop Time 1343    PT Time Calculation (min) 43 min    Activity Tolerance Patient tolerated treatment well    Behavior During Therapy WFL for tasks assessed/performed               Past Medical History:  Diagnosis Date   Anxiety    Arthritis    knees   Blood transfusion without reported diagnosis    BPH (benign prostatic hypertrophy)    Cataract    "beginnings of"   Chronic kidney disease    kidney infection   Depression    GERD (gastroesophageal reflux disease)    Hypertension    Meralgia paresthetica of right side    SCCA (squamous cell carcinoma) of skin 11/05/2020   Left Thigh-Anterior (in situ) (curet and 5FU)   Past Surgical History:  Procedure Laterality Date   HERNIA REPAIR     inguinal, umbilical   SPLENECTOMY     Patient Active Problem List   Diagnosis Date Noted   Bilateral carotid bruits 01/13/2023   AC (acromioclavicular) arthritis 12/24/2022   Left rotator cuff tear 10/13/2022   Degenerative arthritis of knee 02/25/2021   Coronary artery disease due to lipid rich plaque 01/13/2021   Renal mass, right 12/17/2020   RLQ abdominal pain 11/08/2020   Umbilical hernia 11/08/2020   Neoplasm of uncertain behavior of skin 05/01/2020   Trigger finger of left hand 05/01/2020   Dyslipidemia 07/20/2019   Aortic atherosclerosis (HCC) 04/14/2018   Right rotator cuff tear 01/13/2018   Abdominal pain 03/18/2016   Cervical disc disorder with radiculopathy of cervical region 12/06/2015   Edema 12/06/2015   Lower back pain 07/04/2015   Injury of popliteal region 12/07/2014   Right knee  pain 11/13/2013   Pneumonia 12/21/2012   Fatigue 12/21/2012   Dry mouth 12/21/2012   Actinic keratoses 09/09/2012   Well adult exam 06/25/2011   History of splenectomy 06/25/2011   PSA elevation 06/25/2011   Ventral hernia 05/23/2010   Meralgia paresthetica 03/19/2009   Anxiety state 06/25/2007   ERECTILE DYSFUNCTION 06/25/2007   DEPRESSION 06/25/2007   Essential hypertension 06/25/2007   GERD 06/25/2007   BPH (benign prostatic hyperplasia) 06/25/2007    PCP: Tresa Garter, MD   REFERRING PROVIDER: Judi Saa, DO   REFERRING DIAG: [phone] (ICD-10-CM) - Acute pain of left shoulder   THERAPY DIAG:  Acute pain of left shoulder  Cramp and spasm  Stiffness of left shoulder, not elsewhere classified  Rationale for Evaluation and Treatment: Rehabilitation  ONSET DATE: December 2023  SUBJECTIVE:  SUBJECTIVE STATEMENT: Overdid it on Saturday doing overhead work changing vents in the ceiling. Didn't hurt just got fatigued. Using the gel to help with pain. The patch seemed to help some.   PERTINENT HISTORY: Scoliosis, HTN, OA  PAIN:  Are you having pain? Yes: NPRS scale: 0 to 4-5/10 with twinges/10 Pain location: left shoulder and down to shoulder blade Pain description: sharp Aggravating factors: horizontal ABD, OH Relieving factors: Aldara cream  PRECAUTIONS: None  WEIGHT BEARING RESTRICTIONS: No  FALLS:  Has patient fallen in last 6 months? No  LIVING ENVIRONMENT: Lives with: lives with their spouse Lives in: House/apartment Stairs:  N/A Has following equipment at home:  N/A  OCCUPATION: Works part time as Merchandiser, retail at [employer]  PLOF: Independent  PATIENT GOALS: get it as good as the right  NEXT MD VISIT: May   OBJECTIVE:   DIAGNOSTIC FINDINGS:   XR 10/14/22 IMPRESSION: 1. Mild glenohumeral and acromioclavicular osteoarthritis. 2. Likely old healed fracture of the left clavicle midshaft.  PATIENT SURVEYS :  Quick Dash  20.5 / 100 = 20.5 %  COGNITION: Overall cognitive status: Within functional limits for tasks assessed     SENSATION: WFL  POSTURE: Rounded shoulders, Fwd head, scoliosis resulting in depressed left shoulder and scap  UPPER EXTREMITY ROM:   A/P ROM Right eval Left eval Left  01/25/23 Left 02/08/23  Shoulder flexion  180  120 standing  Shoulder extension  FULL    Shoulder abduction  125/150 150/180 125 standing  Shoulder adduction      Shoulder internal rotation  84/90 80   Shoulder external rotation  38/55 75   Elbow flexion  FULL    Elbow extension  FULL    (Blank rows = not tested)  UPPER EXTREMITY MMT:  MMT Right eval Left eval Left  02/08/23 Left 02/24/23  Shoulder flexion  4-* 4+   Shoulder extension  4+ 5   Shoulder abduction  3+* 4+ 4+  Shoulder adduction      Shoulder internal rotation  4 4+   Shoulder external rotation  3+ 3+ 4  Middle trapezius      Lower trapezius      Elbow flexion  5    Elbow extension  5    (Blank rows = not tested) * = PAIN (mild with ABD and ER)  SHOULDER SPECIAL TESTS: Rotator cuff assessment: Drop arm test: negative, Empty can test: negative, Full can test: positive , and Gerber lift off test: positive    JOINT MOBILITY TESTING:  WNL  PALPATION:  L pects, UT, subscap, lats, medial scapular mm.   TODAY'S TREATMENT:                                                                                                                                         DATE:   02/24/23 UBE L4 x 6 min BATCA Low Rows 25#  2x 10 High rows 25# 2x10 Lat pull 25# x 20  Standing: At wall: 3 way reaches hand putting pressure through wash clothSeated  B flexion with yellow loop 1x5 Supine: Hands behind head pec stretch3 x 15 sec B PNF L shoulder  D2 x 10 with PT  resistance Diagonals R x 20 RTB (D2 flex with rotation) SDLY : L ER 1x 10, 2 x 5 with 3 sec tempo L Horizontal ABD 1 x 10 1#  L ABD thumb up x 10 2#   02/17/23 UBE L5 x 6 min Standing: Flex/scaption/ABD 2x10 ea no weight Shelf reaches flexion with yellow loop around wrists x 10 (hurts back) Seated  B flexion with yellow loop 2x10 horizontal ABD  stopped due to pain Supine: Hands behind head pec stretch x 30 sec B Horizontal ABD 2x10 Diagonals R x 20 RTB (D2 flex with rotation) PNF L shoulder  D2 x 10 with PT resistance SDLY : L ER 2 x 5 3 sec tempo L Horizontal ABD 1 x 10 2#  L ABD thumb up x 9 2# stopped due to fatigue  Modalities: Ionotophoresis with 1.0 mL 4mg /ml Dexamethasone - 4-6 hour patch (68mA-min) to L ant shoulder (patch #1 of 6)   02/15/23 UBE L4 x 6 min Seated  horizontal ABD  RTB x 20 Supine: ER RTB x 21 Diagonals R x 20 RTB (D2 flex) Reverse diagonal with 2# weight (D2 ext) - did not focus on rotational component today ER /IR "W" position 2# 2x10 B ER at (almost) 90 deg ABD 2# 2 x 10 B SDLY : L ER 2 x 10 3 sec tempo L Horizontal ABD 1 x 10 2# L ABD thumb up x 10 Standing: Shelf reaches flexion 1# L 1x10 fatigues Flex/scaption/ x 10 ea 1# wt with flexion only; fatigued with scaption today and started to hurt Wall ball with red plyo circles x 20, up/down x 10 Bent over T x 20 L 1#  BATCA Low Rows 25# 2x 10 High rows 25# 2x10 Lat pull 25# x 20   02/10/23 Manual Therapy: to decrease muscle spasm and pain and improve mobility Skilled palpation and monitoring of soft tissues during DN STM and TPR to L UT, lats, rhomboids, LS and infraspinatus/teres Trigger Point Dry-Needling  Treatment instructions: Expect mild to moderate muscle soreness. S/S of pneumothorax if dry needled over a lung field, and to seek immediate medical attention should they occur. Patient verbalized understanding of these instructions and education. Patient Consent Given:  Yes Education handout provided: Previously provided Muscles treated: L pectorals, IS, lats, LS, UT, subscapularis Electrical stimulation performed: No Parameters: N/A Treatment response/outcome: Twitch Response Elicited and Palpable Increase in Muscle Length Standing: Shelf reaches flexion L 2x10 Flex/scaption/ABD x 10 ea 1# wt Robber x 10, then ER x 20 with back against foam roller Wall ball with red plyo circles x 20, up/down x 10, side/side x 10   PATIENT EDUCATION: Education details: HEP update' Person educated: Patient Education method: Explanation, Demonstration, and Handouts Education comprehension: verbalized understanding and returned demonstration  HOME EXERCISE PROGRAM: Access Code: [email or web address] URL: [email or web address] Date: 02/17/2023 Prepared by: Raynelle Fanning  Exercises - Isometric Shoulder Abduction - Arm Straight at Wall (Mirrored)  - 2 x daily - 7 x weekly - 1 sets - 5 reps - 10 sec  hold - Standing Isometric Shoulder External Rotation with Doorway (Mirrored)  - 2 x daily - 7 x weekly - 1 sets - 5 reps - 10 sec  hold - Single Arm Punch with Resistance  - 1 x daily - 7 x weekly - 1-3 sets - 10 reps - Single Arm Shoulder Extension with Resistance  - 1 x daily - 7 x weekly - 1-3 sets - 10 reps - Shoulder Internal Rotation with Resistance  - 1 x daily - 7 x weekly - 1-3 sets - 10 reps - Supine Shoulder Horizontal Abduction with Resistance  - 1 x daily - 3 x weekly - 3 sets - 10 reps - Sidelying Shoulder External Rotation  - 1 x daily - 3 x weekly - 3 sets - 10 reps - Sidelying Shoulder Horizontal Abduction  - 1 x daily - 3 x weekly - 1-3 sets - 10 reps - Standing Shoulder Flexion to 90 Degrees  - 1 x daily - 3 x weekly - 1-3 sets - 10 reps - Standing Shoulder Scaption  - 1 x daily - 3 x weekly - 1-3 sets - 10 reps - Shoulder Abduction - Thumbs Up  - 1 x daily - 3 x weekly - 1-3 sets - 10 reps - Standing Wall Ball Circles with Plyo Ball  - 1 x daily - 7 x weekly  - 2 sets - 10 reps - Seated Shoulder Flexion Full Range with yellow loop  - 1 x daily - 4 x weekly - 1-3 sets - 10 reps  DO LYING ON YOUR BACK   Robber: Scapular Retraction: Elbow Flexion (Standing)   With elbows bent to 90, pinch shoulder blades together and rotate arms out, keeping elbows bent. Repeat _10___ times per set. Do _1-3___ sets per session. Do __1__ sessions per day. May use __2___ pound weights.    ASSESSMENT:  CLINICAL IMPRESSION: Kathlene November continues to demonstrate increased tolerance to exercises, but still fatigues relatively quickly with endurance-type exercises. He is able to self manage pain somewhat with ice, medicated gel and stretches. He continues to demonstrate potential for improvement and would benefit from continued skilled therapy to address impairments.      OBJECTIVE IMPAIRMENTS: decreased ROM, decreased strength, increased muscle spasms, impaired flexibility, impaired UE functional use, postural dysfunction, and pain.   ACTIVITY LIMITATIONS: carrying, lifting, and reach over head  PARTICIPATION LIMITATIONS: occupation  PERSONAL FACTORS: 3+ comorbidities: HTN, OA, scoliosis  are also affecting patient's functional outcome.   REHAB POTENTIAL: Good  CLINICAL DECISION MAKING: Evolving/moderate complexity  EVALUATION COMPLEXITY: Low  GOALS: Goals reviewed with patient? Yes  SHORT TERM GOALS: Target date: 01/27/2023   Patient will be independent with initial HEP.  Baseline:  Goal status: MET    LONG TERM GOALS: Target date: 03/10/2023   Patient will be independent with advanced/ongoing HEP to improve outcomes and carryover.  Baseline:  Goal status: IN PROGRESS  2.  Patient will report 75% improvement in L shoulder pain to improve QOL.  Baseline:  Goal status: IN PROGRESS 50% improvement  3.  Patient to demonstrate improved upright posture with posterior shoulder girdle engaged to promote improved glenohumeral joint mobility. Baseline:  Goal  status: IN PROGRESS  4.  Patient to improve L shoulder AROM to [facility] without pain provocation to allow for increased ease of ADLs including washing hair and reaching out to drive-thru window.  Baseline: limited with ABD, ER and standing flex Goal status: IN PROGRESS 02/08/23 able to wash hair without pain; drive thru motion no pain today.  5.  Patient will demonstrate improved UE strength to 4+/5. Baseline:  Goal status: IN PROGRESS partially met 02/08/23  6  Patient will report 10 on Quick Dash to demonstrate improved functional ability.  Baseline:  20.5 / 100 = 20.5 %  PLAN: PT FREQUENCY: 2x/week  PT DURATION:  12 sessions  PLANNED INTERVENTIONS: Therapeutic exercises, Therapeutic activity, Neuromuscular re-education, Patient/Family education, Self Care, Joint mobilization, Dry Needling, Electrical stimulation, Spinal mobilization, Cryotherapy, Moist heat, Taping, Ultrasound, Ionotophoresis 4mg /ml Dexamethasone, and Manual therapy  PLAN FOR NEXT SESSION:PROGRESS NOTE;  continue shoulder and scapular strength, chest opening,shoulder ROM   Laterica Matarazzo, PT 02/24/2023, 1:46 PM

## 2023-02-24 ENCOUNTER — Ambulatory Visit: Payer: Medicare Other | Attending: Family Medicine | Admitting: Physical Therapy

## 2023-02-24 ENCOUNTER — Encounter: Payer: Self-pay | Admitting: Physical Therapy

## 2023-02-24 DIAGNOSIS — R252 Cramp and spasm: Secondary | ICD-10-CM | POA: Diagnosis not present

## 2023-02-24 DIAGNOSIS — M25512 Pain in left shoulder: Secondary | ICD-10-CM | POA: Diagnosis not present

## 2023-02-24 DIAGNOSIS — M25612 Stiffness of left shoulder, not elsewhere classified: Secondary | ICD-10-CM | POA: Insufficient documentation

## 2023-02-28 NOTE — Therapy (Signed)
OUTPATIENT PHYSICAL THERAPY UPPER EXTREMITY TREATMENT AND PROGRESS NOTE  PROGRESS NOTE  Reporting Period 01/13/23 to 03/01/23   See note below for Objective Data and Assessment of Progress/Goals.    Patient Name: Jeffrey [name] MRN: [id number] DOB:[birth year], 72 y.o., male Today's Date: 03/01/2023  END OF SESSION:  PT End of Session - 03/01/23 1302     Visit Number 10    Number of Visits 12    Date for PT Re-Evaluation 03/10/23    Authorization Type MCR    Progress Note Due on Visit 10    PT Start Time 1300    PT Stop Time 1345    PT Time Calculation (min) 45 min    Activity Tolerance Patient tolerated treatment well    Behavior During Therapy WFL for tasks assessed/performed                Past Medical History:  Diagnosis Date   Anxiety    Arthritis    knees   Blood transfusion without reported diagnosis    BPH (benign prostatic hypertrophy)    Cataract    "beginnings of"   Chronic kidney disease    kidney infection   Depression    GERD (gastroesophageal reflux disease)    Hypertension    Meralgia paresthetica of right side    SCCA (squamous cell carcinoma) of skin 11/05/2020   Left Thigh-Anterior (in situ) (curet and 5FU)   Past Surgical History:  Procedure Laterality Date   HERNIA REPAIR     inguinal, umbilical   SPLENECTOMY     Patient Active Problem List   Diagnosis Date Noted   Bilateral carotid bruits 01/13/2023   AC (acromioclavicular) arthritis 12/24/2022   Left rotator cuff tear 10/13/2022   Degenerative arthritis of knee 02/25/2021   Coronary artery disease due to lipid rich plaque 01/13/2021   Renal mass, right 12/17/2020   RLQ abdominal pain 11/08/2020   Umbilical hernia 11/08/2020   Neoplasm of uncertain behavior of skin 05/01/2020   Trigger finger of left hand 05/01/2020   Dyslipidemia 07/20/2019   Aortic atherosclerosis (HCC) 04/14/2018   Right rotator cuff tear 01/13/2018   Abdominal pain 03/18/2016   Cervical disc disorder  with radiculopathy of cervical region 12/06/2015   Edema 12/06/2015   Lower back pain 07/04/2015   Injury of popliteal region 12/07/2014   Right knee pain 11/13/2013   Pneumonia 12/21/2012   Fatigue 12/21/2012   Dry mouth 12/21/2012   Actinic keratoses 09/09/2012   Well adult exam 06/25/2011   History of splenectomy 06/25/2011   PSA elevation 06/25/2011   Ventral hernia 05/23/2010   Meralgia paresthetica 03/19/2009   Anxiety state 06/25/2007   ERECTILE DYSFUNCTION 06/25/2007   DEPRESSION 06/25/2007   Essential hypertension 06/25/2007   GERD 06/25/2007   BPH (benign prostatic hyperplasia) 06/25/2007    PCP: Tresa Garter, MD   REFERRING PROVIDER: Judi Saa, DO   REFERRING DIAG: [phone] (ICD-10-CM) - Acute pain of left shoulder   THERAPY DIAG:  Acute pain of left shoulder  Cramp and spasm  Stiffness of left shoulder, not elsewhere classified  Rationale for Evaluation and Treatment: Rehabilitation  ONSET DATE: December 2023  SUBJECTIVE:  SUBJECTIVE STATEMENT: Some pain in left arm today. Might have slept on it wrong.  PERTINENT HISTORY: Scoliosis, HTN, OA  PAIN:  Are you having pain? Yes: NPRS scale: 0 to 3.5/10 with twinges/10 Pain location: left shoulder deltoid area Pain description: sharp Aggravating factors: horizontal ABD, OH Relieving factors: Aldara cream  PRECAUTIONS: None  WEIGHT BEARING RESTRICTIONS: No  FALLS:  Has patient fallen in last 6 months? No  LIVING ENVIRONMENT: Lives with: lives with their spouse Lives in: House/apartment Stairs:  N/A Has following equipment at home:  N/A  OCCUPATION: Works part time as Merchandiser, retail at [employer]  PLOF: Independent  PATIENT GOALS: get it as good as the right  NEXT MD VISIT: May    OBJECTIVE:   DIAGNOSTIC FINDINGS:  XR 10/14/22 IMPRESSION: 1. Mild glenohumeral and acromioclavicular osteoarthritis. 2. Likely old healed fracture of the left clavicle midshaft.  PATIENT SURVEYS :  Quick Dash  20.5 / 100 = 20.5 %   03/01/23  20.5 / 100 = 20.5 %  COGNITION: Overall cognitive status: Within functional limits for tasks assessed     SENSATION: WFL  POSTURE: Rounded shoulders, Fwd head, scoliosis resulting in depressed left shoulder and scap  UPPER EXTREMITY ROM:   A/P ROM Right eval Left eval Left  01/25/23 Left 02/08/23 Left 03/01/23  Shoulder flexion  180  120 standing 180 supine; 122 standing  Shoulder extension  FULL     Shoulder abduction  125/150 150/180 125 standing 140 supine; 137 in standing   Shoulder adduction       Shoulder internal rotation  84/90 80  80  Shoulder external rotation  38/55 75  73  Elbow flexion  FULL     Elbow extension  FULL     (Blank rows = not tested)  UPPER EXTREMITY MMT:  MMT Right eval Left eval Left  02/08/23 Left 02/24/23 Left  03/01/23  Shoulder flexion  4-* 4+  5  Shoulder extension  4+ 5    Shoulder abduction  3+* 4+ 4+ 4+  Shoulder adduction       Shoulder internal rotation  4 4+  5  Shoulder external rotation  3+ 3+ 4 4  Middle trapezius       Lower trapezius       Elbow flexion  5     Elbow extension  5     (Blank rows = not tested) * = PAIN (mild with ABD and ER)  SHOULDER SPECIAL TESTS: Rotator cuff assessment: Drop arm test: negative, Empty can test: negative, Full can test: positive , and Gerber lift off test: positive    JOINT MOBILITY TESTING:  WNL  PALPATION:  L pects, UT, subscap, lats, medial scapular mm.   TODAY'S TREATMENT:  DATE:   02/1623 UBE L4 x 6 min TA: QUICK DASH,ROM,MMT, goals assessed  Supine: Hands behind head pec stretch 1x20 sec, also ER  stretch x 20 sec L OH flexion yellow TB loop x 20 PNF L shoulder  D2 x 10 with PT resistance Diagonals R x 20 RTB (D2 flex with rotation) SDLY : L ER 1x 10 with 3 sec tempo L Horizontal ABD 2 x 10 1#  L ABD thumb up x 4 2# joint feels unstable Standing: At wall: 3 way reaches with yellow loop - fatigues easily today (last exercise)  02/24/23 UBE L4 x 6 min BATCA Low Rows 25# 2x 10 High rows 25# 2x10 Lat pull 25# x 20  Standing: At wall: 3 way reaches hand putting pressure through wash cloth  B flexion with yellow loop 1x5 Supine: Hands behind head pec stretch3 x 15 sec B PNF L shoulder  D2 x 10 with PT resistance Diagonals R x 20 RTB (D2 flex with rotation) SDLY : L ER 1x 10, 2 x 5 with 3 sec tempo L Horizontal ABD 1 x 10 1#  L ABD thumb up x 10 2#   02/17/23 UBE L5 x 6 min Standing: Flex/scaption/ABD 2x10 ea no weight Shelf reaches flexion with yellow loop around wrists x 10 (hurts back) Seated  B flexion with yellow loop 2x10 horizontal ABD  stopped due to pain Supine: Hands behind head pec stretch x 30 sec B Horizontal ABD 2x10 Diagonals R x 20 RTB (D2 flex with rotation) PNF L shoulder  D2 x 10 with PT resistance SDLY : L ER 2 x 5 3 sec tempo L Horizontal ABD 1 x 10 2#  L ABD thumb up x 9 2# stopped due to fatigue  Modalities: Ionotophoresis with 1.0 mL 4mg /ml Dexamethasone - 4-6 hour patch (11mA-min) to L ant shoulder (patch #1 of 6)   02/15/23 UBE L4 x 6 min Seated  horizontal ABD  RTB x 20 Supine: ER RTB x 21 Diagonals R x 20 RTB (D2 flex) Reverse diagonal with 2# weight (D2 ext) - did not focus on rotational component today ER /IR "W" position 2# 2x10 B ER at (almost) 90 deg ABD 2# 2 x 10 B SDLY : L ER 2 x 10 3 sec tempo L Horizontal ABD 1 x 10 2# L ABD thumb up x 10 Standing: Shelf reaches flexion 1# L 1x10 fatigues Flex/scaption/ x 10 ea 1# wt with flexion only; fatigued with scaption today and started to hurt Wall ball with red plyo circles x  20, up/down x 10 Bent over T x 20 L 1#  BATCA Low Rows 25# 2x 10 High rows 25# 2x10 Lat pull 25# x 20   02/10/23 Manual Therapy: to decrease muscle spasm and pain and improve mobility Skilled palpation and monitoring of soft tissues during DN STM and TPR to L UT, lats, rhomboids, LS and infraspinatus/teres Trigger Point Dry-Needling  Treatment instructions: Expect mild to moderate muscle soreness. S/S of pneumothorax if dry needled over a lung field, and to seek immediate medical attention should they occur. Patient verbalized understanding of these instructions and education. Patient Consent Given: Yes Education handout provided: Previously provided Muscles treated: L pectorals, IS, lats, LS, UT, subscapularis Electrical stimulation performed: No Parameters: N/A Treatment response/outcome: Twitch Response Elicited and Palpable Increase in Muscle Length Standing: Shelf reaches flexion L 2x10 Flex/scaption/ABD x 10 ea 1# wt Robber x 10, then ER x 20 with back against foam roller Wall ball  with red plyo circles x 20, up/down x 10, side/side x 10   PATIENT EDUCATION: Education details: HEP update' Person educated: Patient Education method: Explanation, Demonstration, and Handouts Education comprehension: verbalized understanding and returned demonstration  HOME EXERCISE PROGRAM: Access Code: [email or web address] URL: [email or web address] Date: 02/17/2023 Prepared by: Raynelle Fanning  Exercises - Isometric Shoulder Abduction - Arm Straight at Wall (Mirrored)  - 2 x daily - 7 x weekly - 1 sets - 5 reps - 10 sec  hold - Standing Isometric Shoulder External Rotation with Doorway (Mirrored)  - 2 x daily - 7 x weekly - 1 sets - 5 reps - 10 sec  hold - Single Arm Punch with Resistance  - 1 x daily - 7 x weekly - 1-3 sets - 10 reps - Single Arm Shoulder Extension with Resistance  - 1 x daily - 7 x weekly - 1-3 sets - 10 reps - Shoulder Internal Rotation with Resistance  - 1 x daily - 7 x  weekly - 1-3 sets - 10 reps - Supine Shoulder Horizontal Abduction with Resistance  - 1 x daily - 3 x weekly - 3 sets - 10 reps - Sidelying Shoulder External Rotation  - 1 x daily - 3 x weekly - 3 sets - 10 reps - Sidelying Shoulder Horizontal Abduction  - 1 x daily - 3 x weekly - 1-3 sets - 10 reps - Standing Shoulder Flexion to 90 Degrees  - 1 x daily - 3 x weekly - 1-3 sets - 10 reps - Standing Shoulder Scaption  - 1 x daily - 3 x weekly - 1-3 sets - 10 reps - Shoulder Abduction - Thumbs Up  - 1 x daily - 3 x weekly - 1-3 sets - 10 reps - Standing Wall Ball Circles with Plyo Ball  - 1 x daily - 7 x weekly - 2 sets - 10 reps - Seated Shoulder Flexion Full Range with yellow loop  - 1 x daily - 4 x weekly - 1-3 sets - 10 reps  DO LYING ON YOUR BACK   Robber: Scapular Retraction: Elbow Flexion (Standing)   With elbows bent to 90, pinch shoulder blades together and rotate arms out, keeping elbows bent. Repeat _10___ times per set. Do _1-3___ sets per session. Do __1__ sessions per day. May use __2___ pound weights.    ASSESSMENT:  CLINICAL IMPRESSION: [name] "Kathlene November" reports 70% improvement in pain in his left shoulder with ADLS and he is now able to wash his hair and reach out at the drive through for his coffee. He continues to be limited with endurance exercises, especially OH,  and with exercises directly targeting the supraspinatus muscle. He has instability in the joint with full can and ABD with thumb up. He also still demonstrates weakness in L shoulder ER. This has improved significantly since eval but he still cannot lift any weight against gravity. Jeffrey [name]'s Quick Dash score has not changed despite him reporting functional improvements. He continues to demonstrate potential for improvement and would benefit from continued skilled therapy to address impairments.      OBJECTIVE IMPAIRMENTS: decreased ROM, decreased strength, increased muscle spasms, impaired flexibility,  impaired UE functional use, postural dysfunction, and pain.   ACTIVITY LIMITATIONS: carrying, lifting, and reach over head  PARTICIPATION LIMITATIONS: occupation  PERSONAL FACTORS: 3+ comorbidities: HTN, OA, scoliosis  are also affecting patient's functional outcome.   REHAB POTENTIAL: Good  CLINICAL DECISION MAKING: Evolving/moderate complexity  EVALUATION COMPLEXITY: Low  GOALS: Goals reviewed with  patient? Yes  SHORT TERM GOALS: Target date: 01/27/2023   Patient will be independent with initial HEP.  Baseline:  Goal status: MET    LONG TERM GOALS: Target date: 03/10/2023   Patient will be independent with advanced/ongoing HEP to improve outcomes and carryover.  Baseline:  Goal status: IN PROGRESS  2.  Patient will report 75% improvement in L shoulder pain to improve QOL.  Baseline:  Goal status: IN PROGRESS 70% improvement 03/01/23  3.  Patient to demonstrate improved upright posture with posterior shoulder girdle engaged to promote improved glenohumeral joint mobility. Baseline:  Goal status: IN PROGRESS 03/01/23  4.  Patient to improve L shoulder AROM to [facility] without pain provocation to allow for increased ease of ADLs including washing hair and reaching out to drive-thru window.  Baseline: limited with ABD, ER and standing flex Goal status: IN PROGRESS 02/08/23 able to wash hair without pain; drive thru motion no pain. 03/01/23 He has pain when lying on his left side and propping up, too much exercise or activity.   5.  Patient will demonstrate improved UE strength to 4+/5. Baseline:  Goal status: IN PROGRESS partially met 03/01/23 ER still 4/5  6  Patient will report 10 on Quick Dash to demonstrate improved functional ability.  Baseline:  20.5 / 100 = 20.5 % Goal status: IN PROGRESS 03/01/23 20.5 / 100 = 20.5 % PLAN: PT FREQUENCY: 2x/week  PT DURATION:  12 sessions  PLANNED INTERVENTIONS: Therapeutic exercises, Therapeutic activity, Neuromuscular re-education,  Patient/Family education, Self Care, Joint mobilization, Dry Needling, Electrical stimulation, Spinal mobilization, Cryotherapy, Moist heat, Taping, Ultrasound, Ionotophoresis 4mg /ml Dexamethasone, and Manual therapy  PLAN FOR NEXT SESSION:PROGRESS NOTE;  continue shoulder and scapular strength, chest opening,shoulder ROM   Shafin Pollio, PT 03/01/2023, 2:58 PM

## 2023-03-01 ENCOUNTER — Encounter: Payer: Self-pay | Admitting: Physical Therapy

## 2023-03-01 ENCOUNTER — Ambulatory Visit: Payer: Medicare Other | Admitting: Physical Therapy

## 2023-03-01 DIAGNOSIS — M25512 Pain in left shoulder: Secondary | ICD-10-CM

## 2023-03-01 DIAGNOSIS — R252 Cramp and spasm: Secondary | ICD-10-CM

## 2023-03-01 DIAGNOSIS — M25612 Stiffness of left shoulder, not elsewhere classified: Secondary | ICD-10-CM | POA: Diagnosis not present

## 2023-03-02 NOTE — Therapy (Signed)
OUTPATIENT PHYSICAL THERAPY UPPER EXTREMITY TREATMENT Patient Name: Jeffrey [name] MRN: [id number] DOB:1951/02/23, 72 y.o., male Today's Date: 03/03/2023  END OF SESSION:  PT End of Session - 03/03/23 1257     Visit Number 11    Number of Visits 12    Date for PT Re-Evaluation 03/10/23    Authorization Type MCR    Progress Note Due on Visit 20    PT Start Time 1300    PT Stop Time 1340    PT Time Calculation (min) 40 min    Activity Tolerance Patient tolerated treatment well    Behavior During Therapy WFL for tasks assessed/performed                Past Medical History:  Diagnosis Date   Anxiety    Arthritis    knees   Blood transfusion without reported diagnosis    BPH (benign prostatic hypertrophy)    Cataract    "beginnings of"   Chronic kidney disease    kidney infection   Depression    GERD (gastroesophageal reflux disease)    Hypertension    Meralgia paresthetica of right side    SCCA (squamous cell carcinoma) of skin 11/05/2020   Left Thigh-Anterior (in situ) (curet and 5FU)   Past Surgical History:  Procedure Laterality Date   HERNIA REPAIR     inguinal, umbilical   SPLENECTOMY     Patient Active Problem List   Diagnosis Date Noted   Bilateral carotid bruits 01/13/2023   AC (acromioclavicular) arthritis 12/24/2022   Left rotator cuff tear 10/13/2022   Degenerative arthritis of knee 02/25/2021   Coronary artery disease due to lipid rich plaque 01/13/2021   Renal mass, right 12/17/2020   RLQ abdominal pain 11/08/2020   Umbilical hernia 11/08/2020   Neoplasm of uncertain behavior of skin 05/01/2020   Trigger finger of left hand 05/01/2020   Dyslipidemia 07/20/2019   Aortic atherosclerosis (HCC) 04/14/2018   Right rotator cuff tear 01/13/2018   Abdominal pain 03/18/2016   Cervical disc disorder with radiculopathy of cervical region 12/06/2015   Edema 12/06/2015   Lower back pain 07/04/2015   Injury of popliteal region 12/07/2014   Right knee  pain 11/13/2013   Pneumonia 12/21/2012   Fatigue 12/21/2012   Dry mouth 12/21/2012   Actinic keratoses 09/09/2012   Well adult exam 06/25/2011   History of splenectomy 06/25/2011   PSA elevation 06/25/2011   Ventral hernia 05/23/2010   Meralgia paresthetica 03/19/2009   Anxiety state 06/25/2007   ERECTILE DYSFUNCTION 06/25/2007   DEPRESSION 06/25/2007   Essential hypertension 06/25/2007   GERD 06/25/2007   BPH (benign prostatic hyperplasia) 06/25/2007    PCP: Tresa Garter, MD   REFERRING PROVIDER: Judi Saa, DO   REFERRING DIAG: [phone] (ICD-10-CM) - Acute pain of left shoulder   THERAPY DIAG:  Acute pain of left shoulder  Cramp and spasm  Stiffness of left shoulder, not elsewhere classified  Rationale for Evaluation and Treatment: Rehabilitation  ONSET DATE: December 2023  SUBJECTIVE:  SUBJECTIVE STATEMENT: I think I'm going to want to keep coming after next week.   PERTINENT HISTORY: Scoliosis, HTN, OA  PAIN:  Are you having pain? Yes: NPRS scale: 0/10 Pain location: left shoulder deltoid area Pain description: sharp Aggravating factors: horizontal ABD, OH Relieving factors: Aldara cream  PRECAUTIONS: None  WEIGHT BEARING RESTRICTIONS: No  FALLS:  Has patient fallen in last 6 months? No  LIVING ENVIRONMENT: Lives with: lives with their spouse Lives in: House/apartment Stairs:  N/A Has following equipment at home:  N/A  OCCUPATION: Works part time as Merchandiser, retail at [employer]  PLOF: Independent  PATIENT GOALS: get it as good as the right  NEXT MD VISIT: May   OBJECTIVE:   DIAGNOSTIC FINDINGS:  XR 10/14/22 IMPRESSION: 1. Mild glenohumeral and acromioclavicular osteoarthritis. 2. Likely old healed fracture of the left clavicle  midshaft.  PATIENT SURVEYS :  Quick Dash  20.5 / 100 = 20.5 %   03/01/23  20.5 / 100 = 20.5 %  COGNITION: Overall cognitive status: Within functional limits for tasks assessed     SENSATION: WFL  POSTURE: Rounded shoulders, Fwd head, scoliosis resulting in depressed left shoulder and scap  UPPER EXTREMITY ROM:   A/P ROM Right eval Left eval Left  01/25/23 Left 02/08/23 Left 03/01/23  Shoulder flexion  180  120 standing 180 supine; 122 standing  Shoulder extension  FULL     Shoulder abduction  125/150 150/180 125 standing 140 supine; 137 in standing   Shoulder adduction       Shoulder internal rotation  84/90 80  80  Shoulder external rotation  38/55 75  73  Elbow flexion  FULL     Elbow extension  FULL     (Blank rows = not tested)  UPPER EXTREMITY MMT:  MMT Right eval Left eval Left  02/08/23 Left 02/24/23 Left  03/01/23  Shoulder flexion  4-* 4+  5  Shoulder extension  4+ 5    Shoulder abduction  3+* 4+ 4+ 4+  Shoulder adduction       Shoulder internal rotation  4 4+  5  Shoulder external rotation  3+ 3+ 4 4  Middle trapezius       Lower trapezius       Elbow flexion  5     Elbow extension  5     (Blank rows = not tested) * = PAIN (mild with ABD and ER)  SHOULDER SPECIAL TESTS: Rotator cuff assessment: Drop arm test: negative, Empty can test: negative, Full can test: positive , and Gerber lift off test: positive    JOINT MOBILITY TESTING:  WNL  PALPATION:  L pects, UT, subscap, lats, medial scapular mm.   TODAY'S TREATMENT:  DATE:    03/03/23 UBE L4 x 6 min Standing: At wall: 3 way reaches with wash cloth x 5 , then with 1# wrist wt x 9 (fatigued) Straight arm up/down and side to side flex 1# with wash cloth x 10 ea Straight arm circles CW/CCW x 10 ea with 1# wt Attempted PNF L D2 flex in standing but unable to do with  YTB Horizontal ABD RTB x 10 fatigues  BATCA Low Rows 25# 2x 10 High rows 25# 2x10 Lat pull 25# x 20 seated  Supine: Hands behind head pec stretch 1x120 sec OH flexion yellow TB loop x 20 Diagonals R x 20 RTB (D2 flex with rotation)  SDLY : L ER 1x 10 with 3 sec hold L Horizontal ABD 2 x 10 1#  L ABD thumb up x10 no wt Circles CW/CCW with 2# wt arm up toward ceiling  03/01/23 UBE L4 x 6 min TA: QUICK DASH,ROM,MMT, goals assessed  Supine: Hands behind head pec stretch 1x20 sec, also ER stretch x 20 sec L OH flexion yellow TB loop x 20 PNF L shoulder  D2 x 10 with PT resistance Diagonals R x 20 RTB (D2 flex with rotation) SDLY : L ER 1x 10 with 3 sec tempo L Horizontal ABD 2 x 10 1#  L ABD thumb up x 4 2# joint feels unstable Standing: At wall: 3 way reaches with yellow loop - fatigues easily today (last exercise)  02/24/23 UBE L4 x 6 min BATCA Low Rows 25# 2x 10 High rows 25# 2x10 Lat pull 25# x 20  Standing: At wall: 3 way reaches hand putting pressure through wash cloth  B flexion with yellow loop 1x5 Supine: Hands behind head pec stretch3 x 15 sec B PNF L shoulder  D2 x 10 with PT resistance Diagonals R x 20 RTB (D2 flex with rotation) SDLY : L ER 1x 10, 2 x 5 with 3 sec tempo L Horizontal ABD 1 x 10 1#  L ABD thumb up x 10 2#   02/17/23 UBE L5 x 6 min Standing: Flex/scaption/ABD 2x10 ea no weight Shelf reaches flexion with yellow loop around wrists x 10 (hurts back) Seated  B flexion with yellow loop 2x10 horizontal ABD  stopped due to pain Supine: Hands behind head pec stretch x 30 sec B Horizontal ABD 2x10 Diagonals R x 20 RTB (D2 flex with rotation) PNF L shoulder  D2 x 10 with PT resistance SDLY : L ER 2 x 5 3 sec tempo L Horizontal ABD 1 x 10 2#  L ABD thumb up x 9 2# stopped due to fatigue  Modalities: Ionotophoresis with 1.0 mL 4mg /ml Dexamethasone - 4-6 hour patch (38mA-min) to L ant shoulder (patch #1 of 6)  PATIENT EDUCATION: Education  details: HEP update' Person educated: Patient Education method: Explanation, Demonstration, and Handouts Education comprehension: verbalized understanding and returned demonstration  HOME EXERCISE PROGRAM: Access Code: [email or web address] URL: [email or web address] Date: 02/17/2023 Prepared by: Raynelle Fanning  Exercises - Isometric Shoulder Abduction - Arm Straight at Wall (Mirrored)  - 2 x daily - 7 x weekly - 1 sets - 5 reps - 10 sec  hold - Standing Isometric Shoulder External Rotation with Doorway (Mirrored)  - 2 x daily - 7 x weekly - 1 sets - 5 reps - 10 sec  hold - Single Arm Punch with Resistance  - 1 x daily - 7 x weekly - 1-3 sets - 10 reps - Single Arm Shoulder Extension with Resistance  -  1 x daily - 7 x weekly - 1-3 sets - 10 reps - Shoulder Internal Rotation with Resistance  - 1 x daily - 7 x weekly - 1-3 sets - 10 reps - Supine Shoulder Horizontal Abduction with Resistance  - 1 x daily - 3 x weekly - 3 sets - 10 reps - Sidelying Shoulder External Rotation  - 1 x daily - 3 x weekly - 3 sets - 10 reps - Sidelying Shoulder Horizontal Abduction  - 1 x daily - 3 x weekly - 1-3 sets - 10 reps - Standing Shoulder Flexion to 90 Degrees  - 1 x daily - 3 x weekly - 1-3 sets - 10 reps - Standing Shoulder Scaption  - 1 x daily - 3 x weekly - 1-3 sets - 10 reps - Shoulder Abduction - Thumbs Up  - 1 x daily - 3 x weekly - 1-3 sets - 10 reps - Standing Wall Ball Circles with Plyo Ball  - 1 x daily - 7 x weekly - 2 sets - 10 reps - Seated Shoulder Flexion Full Range with yellow loop  - 1 x daily - 4 x weekly - 1-3 sets - 10 reps  DO LYING ON YOUR BACK   Robber: Scapular Retraction: Elbow Flexion (Standing)   With elbows bent to 90, pinch shoulder blades together and rotate arms out, keeping elbows bent. Repeat _10___ times per set. Do _1-3___ sets per session. Do __1__ sessions per day. May use __2___ pound weights.    ASSESSMENT:  CLINICAL IMPRESSION: Kathlene November continues to improve. He  can reach up and grab his seatbelt with the L hand now and can close the car door with his Left arm without pain. He still can't work [facility] for very long due to fatigue, so we will focus on this next session. Kathlene November continues to demonstrate potential for improvement and would benefit from continued skilled therapy to address impairments.      OBJECTIVE IMPAIRMENTS: decreased ROM, decreased strength, increased muscle spasms, impaired flexibility, impaired UE functional use, postural dysfunction, and pain.   ACTIVITY LIMITATIONS: carrying, lifting, and reach over head  PARTICIPATION LIMITATIONS: occupation  PERSONAL FACTORS: 3+ comorbidities: HTN, OA, scoliosis  are also affecting patient's functional outcome.   REHAB POTENTIAL: Good  CLINICAL DECISION MAKING: Evolving/moderate complexity  EVALUATION COMPLEXITY: Low  GOALS: Goals reviewed with patient? Yes  SHORT TERM GOALS: Target date: 01/27/2023   Patient will be independent with initial HEP.  Baseline:  Goal status: MET    LONG TERM GOALS: Target date: 03/10/2023   Patient will be independent with advanced/ongoing HEP to improve outcomes and carryover.  Baseline:  Goal status: IN PROGRESS  2.  Patient will report 75% improvement in L shoulder pain to improve QOL.  Baseline:  Goal status: IN PROGRESS 70% improvement 03/01/23  3.  Patient to demonstrate improved upright posture with posterior shoulder girdle engaged to promote improved glenohumeral joint mobility. Baseline:  Goal status: IN PROGRESS 03/01/23  4.  Patient to improve L shoulder AROM to [facility] without pain provocation to allow for increased ease of ADLs including washing hair and reaching out to drive-thru window.  Baseline: limited with ABD, ER and standing flex Goal status: IN PROGRESS 02/08/23 able to wash hair without pain; drive thru motion no pain. 03/01/23 He has pain when lying on his left side and propping up, too much exercise or activity.   5.  Patient will  demonstrate improved UE strength to 4+/5. Baseline:  Goal status: IN  PROGRESS partially met 03/01/23 ER still 4/5  6  Patient will report 10 on Quick Dash to demonstrate improved functional ability.  Baseline:  20.5 / 100 = 20.5 % Goal status: IN PROGRESS 03/01/23 20.5 / 100 = 20.5 % PLAN: PT FREQUENCY: 2x/week  PT DURATION:  12 sessions  PLANNED INTERVENTIONS: Therapeutic exercises, Therapeutic activity, Neuromuscular re-education, Patient/Family education, Self Care, Joint mobilization, Dry Needling, Electrical stimulation, Spinal mobilization, Cryotherapy, Moist heat, Taping, Ultrasound, Ionotophoresis 4mg /ml Dexamethasone, and Manual therapy  PLAN FOR NEXT SESSION continue shoulder and scapular strength, chest opening,shoulder ROM   Marquis Diles, PT 03/03/2023, 1:44 PM

## 2023-03-03 ENCOUNTER — Ambulatory Visit: Payer: Medicare Other | Admitting: Physical Therapy

## 2023-03-03 ENCOUNTER — Encounter: Payer: Self-pay | Admitting: Physical Therapy

## 2023-03-03 DIAGNOSIS — M25612 Stiffness of left shoulder, not elsewhere classified: Secondary | ICD-10-CM | POA: Diagnosis not present

## 2023-03-03 DIAGNOSIS — M25512 Pain in left shoulder: Secondary | ICD-10-CM

## 2023-03-03 DIAGNOSIS — R252 Cramp and spasm: Secondary | ICD-10-CM

## 2023-03-06 ENCOUNTER — Other Ambulatory Visit: Payer: Self-pay | Admitting: Internal Medicine

## 2023-03-08 ENCOUNTER — Ambulatory Visit: Payer: Medicare Other | Admitting: Physical Therapy

## 2023-03-08 ENCOUNTER — Encounter: Payer: Self-pay | Admitting: Physical Therapy

## 2023-03-08 DIAGNOSIS — R252 Cramp and spasm: Secondary | ICD-10-CM | POA: Diagnosis not present

## 2023-03-08 DIAGNOSIS — M25512 Pain in left shoulder: Secondary | ICD-10-CM

## 2023-03-08 DIAGNOSIS — M25612 Stiffness of left shoulder, not elsewhere classified: Secondary | ICD-10-CM

## 2023-03-08 NOTE — Therapy (Signed)
OUTPATIENT PHYSICAL THERAPY UPPER EXTREMITY TREATMENT/RECERTIFICATION Patient Name: Jeffrey [name] MRN: [id number] DOB:10/13/1951, 72 y.o., male Today's Date: 03/08/2023  END OF SESSION:  PT End of Session - 03/08/23 1259     Visit Number 12    Number of Visits 20    Date for PT Re-Evaluation 04/07/23    Authorization Type MCR    Progress Note Due on Visit 20    PT Start Time 1300    PT Stop Time 1345    PT Time Calculation (min) 45 min    Activity Tolerance Patient tolerated treatment well    Behavior During Therapy WFL for tasks assessed/performed                 Past Medical History:  Diagnosis Date   Anxiety    Arthritis    knees   Blood transfusion without reported diagnosis    BPH (benign prostatic hypertrophy)    Cataract    "beginnings of"   Chronic kidney disease    kidney infection   Depression    GERD (gastroesophageal reflux disease)    Hypertension    Meralgia paresthetica of right side    SCCA (squamous cell carcinoma) of skin 11/05/2020   Left Thigh-Anterior (in situ) (curet and 5FU)   Past Surgical History:  Procedure Laterality Date   HERNIA REPAIR     inguinal, umbilical   SPLENECTOMY     Patient Active Problem List   Diagnosis Date Noted   Bilateral carotid bruits 01/13/2023   AC (acromioclavicular) arthritis 12/24/2022   Left rotator cuff tear 10/13/2022   Degenerative arthritis of knee 02/25/2021   Coronary artery disease due to lipid rich plaque 01/13/2021   Renal mass, right 12/17/2020   RLQ abdominal pain 11/08/2020   Umbilical hernia 11/08/2020   Neoplasm of uncertain behavior of skin 05/01/2020   Trigger finger of left hand 05/01/2020   Dyslipidemia 07/20/2019   Aortic atherosclerosis (HCC) 04/14/2018   Right rotator cuff tear 01/13/2018   Abdominal pain 03/18/2016   Cervical disc disorder with radiculopathy of cervical region 12/06/2015   Edema 12/06/2015   Lower back pain 07/04/2015   Injury of popliteal region  12/07/2014   Right knee pain 11/13/2013   Pneumonia 12/21/2012   Fatigue 12/21/2012   Dry mouth 12/21/2012   Actinic keratoses 09/09/2012   Well adult exam 06/25/2011   History of splenectomy 06/25/2011   PSA elevation 06/25/2011   Ventral hernia 05/23/2010   Meralgia paresthetica 03/19/2009   Anxiety state 06/25/2007   ERECTILE DYSFUNCTION 06/25/2007   DEPRESSION 06/25/2007   Essential hypertension 06/25/2007   GERD 06/25/2007   BPH (benign prostatic hyperplasia) 06/25/2007    PCP: Tresa Garter, MD   REFERRING PROVIDER: Judi Saa, DO   REFERRING DIAG: [phone] (ICD-10-CM) - Acute pain of left shoulder   THERAPY DIAG:  Acute pain of left shoulder  Cramp and spasm  Stiffness of left shoulder, not elsewhere classified  Rationale for Evaluation and Treatment: Rehabilitation  ONSET DATE: December 2023  SUBJECTIVE:  SUBJECTIVE STATEMENT: Doing pretty good. Sore after working in the yard on Saturday.   PERTINENT HISTORY: Scoliosis, HTN, OA  PAIN:  Are you having pain? Yes: NPRS scale: 3/10 Pain location: left shoulder deltoid area Pain description: sharp Aggravating factors: horizontal ABD, OH Relieving factors: Aldara cream  PRECAUTIONS: None  WEIGHT BEARING RESTRICTIONS: No  FALLS:  Has patient fallen in last 6 months? No  LIVING ENVIRONMENT: Lives with: lives with their spouse Lives in: House/apartment Stairs:  N/A Has following equipment at home:  N/A  OCCUPATION: Works part time as Merchandiser, retail at [employer]  PLOF: Independent  PATIENT GOALS: get it as good as the right  NEXT MD VISIT: May   OBJECTIVE:   DIAGNOSTIC FINDINGS:  XR 10/14/22 IMPRESSION: 1. Mild glenohumeral and acromioclavicular osteoarthritis. 2. Likely old healed fracture of  the left clavicle midshaft.  PATIENT SURVEYS :  Quick Dash  20.5 / 100 = 20.5 %   03/01/23  20.5 / 100 = 20.5 %  COGNITION: Overall cognitive status: Within functional limits for tasks assessed     SENSATION: WFL  POSTURE: Rounded shoulders, Fwd head, scoliosis resulting in depressed left shoulder and scap  UPPER EXTREMITY ROM:   A/P ROM Right eval Left eval Left  01/25/23 Left 02/08/23 Left 03/01/23  Shoulder flexion  180  120 standing 180 supine; 122 standing  Shoulder extension  FULL     Shoulder abduction  125/150 150/180 125 standing 140 supine; 137 in standing   Shoulder adduction       Shoulder internal rotation  84/90 80  80  Shoulder external rotation  38/55 75  73  Elbow flexion  FULL     Elbow extension  FULL     (Blank rows = not tested)  UPPER EXTREMITY MMT:  MMT Right eval Left eval Left  02/08/23 Left 02/24/23 Left  03/01/23  Shoulder flexion  4-* 4+  5  Shoulder extension  4+ 5    Shoulder abduction  3+* 4+ 4+ 4+  Shoulder adduction       Shoulder internal rotation  4 4+  5  Shoulder external rotation  3+ 3+ 4 4  Middle trapezius       Lower trapezius       Elbow flexion  5     Elbow extension  5     (Blank rows = not tested) * = PAIN (mild with ABD and ER)  SHOULDER SPECIAL TESTS: Rotator cuff assessment: Drop arm test: negative, Empty can test: negative, Full can test: positive , and Gerber lift off test: positive    JOINT MOBILITY TESTING:  WNL  PALPATION:  L pects, UT, subscap, lats, medial scapular mm.   TODAY'S TREATMENT:  DATE:   03/08/23 UBE L4 x 6 min Standing: At wall: 3 way reaches with wash cloth x 5 , then with 1# wrist wt x 9 (fatigued) OH reaching with Squigz on door to fatigue  Manual Therapy: to decrease muscle spasm and pain and improve mobility Skilled palpation and monitoring of soft  tissues during DN STM and TPR to L UT, lats, rhomboids, LS and infraspinatus/teres Trigger Point Dry-Needling  Treatment instructions: Expect mild to moderate muscle soreness. S/S of pneumothorax if dry needled over a lung field, and to seek immediate medical attention should they occur. Patient verbalized understanding of these instructions and education. Patient Consent Given: Yes Education handout provided: Previously provided Muscles treated: L IS, lats, LS, UT, subscapularis, teres Electrical stimulation performed: No Parameters: N/A Treatment response/outcome: Twitch Response Elicited and Palpable Increase in Muscle Length  03/03/23 UBE L4 x 6 min Standing: At wall: 3 way reaches with wash cloth x 5 , then with 1# wrist wt x 9 (fatigued) Straight arm up/down and side to side flex 1# with wash cloth x 10 ea Straight arm circles CW/CCW x 10 ea with 1# wt Attempted PNF L D2 flex in standing but unable to do with YTB Horizontal ABD RTB x 10 fatigues  BATCA Low Rows 25# 2x 10 High rows 25# 2x10 Lat pull 25# x 20 seated  Supine: Hands behind head pec stretch 1x120 sec OH flexion yellow TB loop x 20 Diagonals R x 20 RTB (D2 flex with rotation)  SDLY : L ER 1x 10 with 3 sec hold L Horizontal ABD 2 x 10 1#  L ABD thumb up x10 no wt Circles CW/CCW with 2# wt arm up toward ceiling  03/01/23 UBE L4 x 6 min TA: QUICK DASH,ROM,MMT, goals assessed  Supine: Hands behind head pec stretch 1x20 sec, also ER stretch x 20 sec L OH flexion yellow TB loop x 20 PNF L shoulder  D2 x 10 with PT resistance Diagonals R x 20 RTB (D2 flex with rotation) SDLY : L ER 1x 10 with 3 sec tempo L Horizontal ABD 2 x 10 1#  L ABD thumb up x 4 2# joint feels unstable Standing: At wall: 3 way reaches with yellow loop - fatigues easily today (last exercise)    PATIENT EDUCATION: Education details: HEP update' Person educated: Patient Education method: Explanation, Demonstration, and  Handouts Education comprehension: verbalized understanding and returned demonstration  HOME EXERCISE PROGRAM: Access Code: [email or web address] URL: [email or web address] Date: 02/17/2023 Prepared by: Raynelle Fanning  Exercises - Isometric Shoulder Abduction - Arm Straight at Wall (Mirrored)  - 2 x daily - 7 x weekly - 1 sets - 5 reps - 10 sec  hold - Standing Isometric Shoulder External Rotation with Doorway (Mirrored)  - 2 x daily - 7 x weekly - 1 sets - 5 reps - 10 sec  hold - Single Arm Punch with Resistance  - 1 x daily - 7 x weekly - 1-3 sets - 10 reps - Single Arm Shoulder Extension with Resistance  - 1 x daily - 7 x weekly - 1-3 sets - 10 reps - Shoulder Internal Rotation with Resistance  - 1 x daily - 7 x weekly - 1-3 sets - 10 reps - Supine Shoulder Horizontal Abduction with Resistance  - 1 x daily - 3 x weekly - 3 sets - 10 reps - Sidelying Shoulder External Rotation  - 1 x daily - 3 x weekly - 3 sets - 10 reps - Sidelying Shoulder Horizontal  Abduction  - 1 x daily - 3 x weekly - 1-3 sets - 10 reps - Standing Shoulder Flexion to 90 Degrees  - 1 x daily - 3 x weekly - 1-3 sets - 10 reps - Standing Shoulder Scaption  - 1 x daily - 3 x weekly - 1-3 sets - 10 reps - Shoulder Abduction - Thumbs Up  - 1 x daily - 3 x weekly - 1-3 sets - 10 reps - Standing Wall Ball Circles with Plyo Ball  - 1 x daily - 7 x weekly - 2 sets - 10 reps - Seated Shoulder Flexion Full Range with yellow loop  - 1 x daily - 4 x weekly - 1-3 sets - 10 reps  DO LYING ON YOUR BACK   Robber: Scapular Retraction: Elbow Flexion (Standing)   With elbows bent to 90, pinch shoulder blades together and rotate arms out, keeping elbows bent. Repeat _10___ times per set. Do _1-3___ sets per session. Do __1__ sessions per day. May use __2___ pound weights.    ASSESSMENT:  CLINICAL IMPRESSION:  [name] "Kathlene November" reports his pain has improved significantly, but he is still limited with L shoulder strength and ROM  affecting ADLs particularly OH activities. From last week's progress note: He reports 70% improvement in pain in his left shoulder with ADLS and he is now able to wash his hair and reach out at the drive through for his coffee. He continues to be limited with endurance exercises, especially OH,  and with exercises directly targeting the supraspinatus muscle. He has instability in the joint with full can and ABD with thumb up. He also still demonstrates weakness in L shoulder ER. This has improved significantly since eval but he still cannot lift any weight against gravity. Mike's Quick Dash score has not changed despite him reporting functional improvements. Good response to DN today reporting improved mobility and less pain after. He continues to demonstrate potential for improvement and would benefit from continued skilled therapy to address impairments I recommend extending his current POC 2x/wk for 4 weeks to meet unmet LTGs.     OBJECTIVE IMPAIRMENTS: decreased ROM, decreased strength, increased muscle spasms, impaired flexibility, impaired UE functional use, postural dysfunction, and pain.   ACTIVITY LIMITATIONS: carrying, lifting, and reach over head  PARTICIPATION LIMITATIONS: occupation  PERSONAL FACTORS: 3+ comorbidities: HTN, OA, scoliosis  are also affecting patient's functional outcome.   REHAB POTENTIAL: Good  CLINICAL DECISION MAKING: Evolving/moderate complexity  EVALUATION COMPLEXITY: Low  GOALS: Goals reviewed with patient? Yes  SHORT TERM GOALS: Target date: 01/27/2023   Patient will be independent with initial HEP.  Baseline:  Goal status: MET    LONG TERM GOALS: Target date: 03/10/2023   Patient will be independent with advanced/ongoing HEP to improve outcomes and carryover.  Baseline:  Goal status: IN PROGRESS  2.  Patient will report 75% improvement in L shoulder pain to improve QOL.  Baseline:  Goal status: IN PROGRESS 70% improvement 03/01/23  3.  Patient  to demonstrate improved upright posture with posterior shoulder girdle engaged to promote improved glenohumeral joint mobility. Baseline:  Goal status: IN PROGRESS 03/01/23  4.  Patient to improve L shoulder AROM to [facility] without pain provocation to allow for increased ease of ADLs including washing hair and reaching out to drive-thru window.  Baseline: limited with ABD, ER and standing flex Goal status: IN PROGRESS 02/08/23 able to wash hair without pain; drive thru motion no pain. 03/01/23 He has pain when lying  on his left side and propping up, too much exercise or activity.   5.  Patient will demonstrate improved UE strength to 4+/5. Baseline:  Goal status: IN PROGRESS partially met 03/01/23 ER still 4/5  6  Patient will report 10 on Quick Dash to demonstrate improved functional ability.  Baseline:  20.5 / 100 = 20.5 % Goal status: IN PROGRESS 03/01/23 20.5 / 100 = 20.5 % PLAN: PT FREQUENCY: 2x/week  PT DURATION:  12 sessions  PLANNED INTERVENTIONS: Therapeutic exercises, Therapeutic activity, Neuromuscular re-education, Patient/Family education, Self Care, Joint mobilization, Dry Needling, Electrical stimulation, Spinal mobilization, Cryotherapy, Moist heat, Taping, Ultrasound, Ionotophoresis 4mg /ml Dexamethasone, and Manual therapy  PLAN FOR NEXT SESSION continue shoulder and scapular strength, focus on OH endurance activities, MT/DN prn  Deeric Cruise, PT 03/08/2023, 1:55 PM

## 2023-03-09 NOTE — Therapy (Signed)
OUTPATIENT PHYSICAL THERAPY UPPER EXTREMITY TREATMENT  Patient Name: Jeffrey [name] MRN: [id number] DOB:May 10, 1951, 72 y.o., male Today's Date: 03/10/2023  END OF SESSION:  PT End of Session - 03/10/23 1258     Visit Number 13    Number of Visits 20    Date for PT Re-Evaluation 04/07/23    Authorization Type MCR    Progress Note Due on Visit 20    PT Start Time 1300    PT Stop Time 1348    PT Time Calculation (min) 48 min    Activity Tolerance Patient tolerated treatment well    Behavior During Therapy WFL for tasks assessed/performed                 Past Medical History:  Diagnosis Date   Anxiety    Arthritis    knees   Blood transfusion without reported diagnosis    BPH (benign prostatic hypertrophy)    Cataract    "beginnings of"   Chronic kidney disease    kidney infection   Depression    GERD (gastroesophageal reflux disease)    Hypertension    Meralgia paresthetica of right side    SCCA (squamous cell carcinoma) of skin 11/05/2020   Left Thigh-Anterior (in situ) (curet and 5FU)   Past Surgical History:  Procedure Laterality Date   HERNIA REPAIR     inguinal, umbilical   SPLENECTOMY     Patient Active Problem List   Diagnosis Date Noted   Bilateral carotid bruits 01/13/2023   AC (acromioclavicular) arthritis 12/24/2022   Left rotator cuff tear 10/13/2022   Degenerative arthritis of knee 02/25/2021   Coronary artery disease due to lipid rich plaque 01/13/2021   Renal mass, right 12/17/2020   RLQ abdominal pain 11/08/2020   Umbilical hernia 11/08/2020   Neoplasm of uncertain behavior of skin 05/01/2020   Trigger finger of left hand 05/01/2020   Dyslipidemia 07/20/2019   Aortic atherosclerosis (HCC) 04/14/2018   Right rotator cuff tear 01/13/2018   Abdominal pain 03/18/2016   Cervical disc disorder with radiculopathy of cervical region 12/06/2015   Edema 12/06/2015   Lower back pain 07/04/2015   Injury of popliteal region 12/07/2014   Right  knee pain 11/13/2013   Pneumonia 12/21/2012   Fatigue 12/21/2012   Dry mouth 12/21/2012   Actinic keratoses 09/09/2012   Well adult exam 06/25/2011   History of splenectomy 06/25/2011   PSA elevation 06/25/2011   Ventral hernia 05/23/2010   Meralgia paresthetica 03/19/2009   Anxiety state 06/25/2007   ERECTILE DYSFUNCTION 06/25/2007   DEPRESSION 06/25/2007   Essential hypertension 06/25/2007   GERD 06/25/2007   BPH (benign prostatic hyperplasia) 06/25/2007    PCP: Tresa Garter, MD   REFERRING PROVIDER: Judi Saa, DO   REFERRING DIAG: [phone] (ICD-10-CM) - Acute pain of left shoulder   THERAPY DIAG:  Acute pain of left shoulder  Cramp and spasm  Stiffness of left shoulder, not elsewhere classified  Rationale for Evaluation and Treatment: Rehabilitation  ONSET DATE: December 2023  SUBJECTIVE:  SUBJECTIVE STATEMENT: Just sore today.   PERTINENT HISTORY: Scoliosis, HTN, OA  PAIN:  Are you having pain? Yes: NPRS scale: 3/10 Pain location: left shoulder deltoid area Pain description: sharp Aggravating factors: horizontal ABD, OH Relieving factors: Aldara cream  PRECAUTIONS: None  WEIGHT BEARING RESTRICTIONS: No  FALLS:  Has patient fallen in last 6 months? No  LIVING ENVIRONMENT: Lives with: lives with their spouse Lives in: House/apartment Stairs:  N/A Has following equipment at home:  N/A  OCCUPATION: Works part time as Merchandiser, retail at [employer]  PLOF: Independent  PATIENT GOALS: get it as good as the right  NEXT MD VISIT: 03/17/23  OBJECTIVE:   DIAGNOSTIC FINDINGS:  XR 10/14/22 IMPRESSION: 1. Mild glenohumeral and acromioclavicular osteoarthritis. 2. Likely old healed fracture of the left clavicle midshaft.  PATIENT SURVEYS :  Quick Dash   20.5 / 100 = 20.5 %   03/01/23  20.5 / 100 = 20.5 %  COGNITION: Overall cognitive status: Within functional limits for tasks assessed     SENSATION: WFL  POSTURE: Rounded shoulders, Fwd head, scoliosis resulting in depressed left shoulder and scap  UPPER EXTREMITY ROM:   A/P ROM Right eval Left eval Left  01/25/23 Left 02/08/23 Left 03/01/23  Shoulder flexion  180  120 standing 180 supine; 122 standing  Shoulder extension  FULL     Shoulder abduction  125/150 150/180 125 standing 140 supine; 137 in standing   Shoulder adduction       Shoulder internal rotation  84/90 80  80  Shoulder external rotation  38/55 75  73  Elbow flexion  FULL     Elbow extension  FULL     (Blank rows = not tested)  UPPER EXTREMITY MMT:  MMT Left eval Left  02/08/23 Left 02/24/23 Left  03/01/23  Shoulder flexion 4-* 4+  5  Shoulder extension 4+ 5    Shoulder abduction 3+* 4+ 4+ 4+  Shoulder adduction      Shoulder internal rotation 4 4+  5  Shoulder external rotation 3+ 3+ 4 4  Middle trapezius      Lower trapezius      Elbow flexion 5     Elbow extension 5     (Blank rows = not tested) * = PAIN (mild with ABD and ER)  SHOULDER SPECIAL TESTS: Rotator cuff assessment: Drop arm test: negative, Empty can test: negative, Full can test: positive , and Gerber lift off test: positive    JOINT MOBILITY TESTING:  WNL  PALPATION:  L pects, UT, subscap, lats, medial scapular mm.   TODAY'S TREATMENT:                                                                                                                                         DATE:  03/10/23 UBE L5 x 6 min Standing: L biceps stretch x 30 sec At wall: 3 way  reaches with wash cloth with 1# wrist wt x 13 (fatigued) OH reaching with Squigz on cabinet door to fatigue 3 way to 90 degrees x 10 ea L only thumb up  BATCA Low Rows 25# 2 x 10 High rows 25# 3x10 Lat pull 25# x 20 seated  Supine: Horizontal ABD GTB x 25 Hands behind head pec  stretch 1x30 sec OH weighted ball pass x 20 Red ball (not very challenging) OH flexion (lat pull) 6# DB hooklying  x 20 Diagonals L x 20 500 gram ball  (D2 flex with rotation) then  with 3# DB x 10 (more challenging)  Manual: STM to L biceps x 8 min  03/08/23 UBE L4 x 6 min Standing: At wall: 3 way reaches with wash cloth x 5 , then with 1# wrist wt x 9 (fatigued) OH reaching with Squigz on door to fatigue  Manual Therapy: to decrease muscle spasm and pain and improve mobility Skilled palpation and monitoring of soft tissues during DN STM and TPR to L UT, lats, rhomboids, LS and infraspinatus/teres Trigger Point Dry-Needling  Treatment instructions: Expect mild to moderate muscle soreness. S/S of pneumothorax if dry needled over a lung field, and to seek immediate medical attention should they occur. Patient verbalized understanding of these instructions and education. Patient Consent Given: Yes Education handout provided: Previously provided Muscles treated: L IS, lats, LS, UT, subscapularis, teres Electrical stimulation performed: No Parameters: N/A Treatment response/outcome: Twitch Response Elicited and Palpable Increase in Muscle Length  03/03/23 UBE L4 x 6 min Standing: At wall: 3 way reaches with wash cloth x 5 , then with 1# wrist wt x 9 (fatigued) Straight arm up/down and side to side flex 1# with wash cloth x 10 ea Straight arm circles CW/CCW x 10 ea with 1# wt Attempted PNF L D2 flex in standing but unable to do with YTB Horizontal ABD RTB x 10 fatigues  BATCA Low Rows 25# 2x 10 High rows 25# 2x10 Lat pull 25# x 20 seated  Supine: Hands behind head pec stretch 1x120 sec OH flexion yellow TB loop x 20 Diagonals R x 20 RTB (D2 flex with rotation)  SDLY : L ER 1x 10 with 3 sec hold L Horizontal ABD 2 x 10 1#  L ABD thumb up x10 no wt Circles CW/CCW with 2# wt arm up toward ceiling  PATIENT EDUCATION: Education details: HEP update' Person educated:  Patient Education method: Explanation, Demonstration, and Handouts Education comprehension: verbalized understanding and returned demonstration  HOME EXERCISE PROGRAM: Access Code: [email or web address] URL: [email or web address] Date: 02/17/2023 Prepared by: Raynelle Fanning  Exercises - Isometric Shoulder Abduction - Arm Straight at Wall (Mirrored)  - 2 x daily - 7 x weekly - 1 sets - 5 reps - 10 sec  hold - Standing Isometric Shoulder External Rotation with Doorway (Mirrored)  - 2 x daily - 7 x weekly - 1 sets - 5 reps - 10 sec  hold - Single Arm Punch with Resistance  - 1 x daily - 7 x weekly - 1-3 sets - 10 reps - Single Arm Shoulder Extension with Resistance  - 1 x daily - 7 x weekly - 1-3 sets - 10 reps - Shoulder Internal Rotation with Resistance  - 1 x daily - 7 x weekly - 1-3 sets - 10 reps - Supine Shoulder Horizontal Abduction with Resistance  - 1 x daily - 3 x weekly - 3 sets - 10 reps - Sidelying Shoulder External Rotation  - 1 x daily -  3 x weekly - 3 sets - 10 reps - Sidelying Shoulder Horizontal Abduction  - 1 x daily - 3 x weekly - 1-3 sets - 10 reps - Standing Shoulder Flexion to 90 Degrees  - 1 x daily - 3 x weekly - 1-3 sets - 10 reps - Standing Shoulder Scaption  - 1 x daily - 3 x weekly - 1-3 sets - 10 reps - Shoulder Abduction - Thumbs Up  - 1 x daily - 3 x weekly - 1-3 sets - 10 reps - Standing Wall Ball Circles with Plyo Ball  - 1 x daily - 7 x weekly - 2 sets - 10 reps - Seated Shoulder Flexion Full Range with yellow loop  - 1 x daily - 4 x weekly - 1-3 sets - 10 reps  DO LYING ON YOUR BACK   Robber: Scapular Retraction: Elbow Flexion (Standing)   With elbows bent to 90, pinch shoulder blades together and rotate arms out, keeping elbows bent. Repeat _10___ times per set. Do _1-3___ sets per session. Do __1__ sessions per day. May use __2___ pound weights.    ASSESSMENT:  CLINICAL IMPRESSION: Kathlene November tolerated OH activities a little better today; We've been  alternating OH activities with other exercises to improve tolerance. Greatest weaknesses are ER and and scaption activiites. He complained of pain in his L biceps today. He has increased tone and tenderness. Good response to MT. He may benefit from DN here, but wanted to defer it today.      OBJECTIVE IMPAIRMENTS: decreased ROM, decreased strength, increased muscle spasms, impaired flexibility, impaired UE functional use, postural dysfunction, and pain.   ACTIVITY LIMITATIONS: carrying, lifting, and reach over head  PARTICIPATION LIMITATIONS: occupation  PERSONAL FACTORS: 3+ comorbidities: HTN, OA, scoliosis  are also affecting patient's functional outcome.   REHAB POTENTIAL: Good  CLINICAL DECISION MAKING: Evolving/moderate complexity  EVALUATION COMPLEXITY: Low  GOALS: Goals reviewed with patient? Yes  SHORT TERM GOALS: Target date: 01/27/2023   Patient will be independent with initial HEP.  Baseline:  Goal status: MET    LONG TERM GOALS: Target date: 03/10/2023   Patient will be independent with advanced/ongoing HEP to improve outcomes and carryover.  Baseline:  Goal status: IN PROGRESS  2.  Patient will report 75% improvement in L shoulder pain to improve QOL.  Baseline:  Goal status: IN PROGRESS 70% improvement 03/01/23  3.  Patient to demonstrate improved upright posture with posterior shoulder girdle engaged to promote improved glenohumeral joint mobility. Baseline:  Goal status: IN PROGRESS 03/01/23  4.  Patient to improve L shoulder AROM to [facility] without pain provocation to allow for increased ease of ADLs including washing hair and reaching out to drive-thru window.  Baseline: limited with ABD, ER and standing flex Goal status: IN PROGRESS 02/08/23 able to wash hair without pain; drive thru motion no pain. 03/01/23 He has pain when lying on his left side and propping up, too much exercise or activity.   5.  Patient will demonstrate improved UE strength to  4+/5. Baseline:  Goal status: IN PROGRESS partially met 03/01/23 ER still 4/5  6  Patient will report 10 on Quick Dash to demonstrate improved functional ability.  Baseline:  20.5 / 100 = 20.5 % Goal status: IN PROGRESS 03/01/23 20.5 / 100 = 20.5 % PLAN: PT FREQUENCY: 2x/week  PT DURATION:  12 sessions  PLANNED INTERVENTIONS: Therapeutic exercises, Therapeutic activity, Neuromuscular re-education, Patient/Family education, Self Care, Joint mobilization, Dry Needling, Electrical stimulation, Spinal mobilization, Cryotherapy,  Moist heat, Taping, Ultrasound, Ionotophoresis 4mg /ml Dexamethasone, and Manual therapy  PLAN FOR NEXT SESSION continue shoulder and scapular strength, focus on OH endurance activities, MT/DN prn  Marialy Urbanczyk, PT 03/10/2023, 1:54 PM

## 2023-03-10 ENCOUNTER — Encounter: Payer: Self-pay | Admitting: Physical Therapy

## 2023-03-10 ENCOUNTER — Ambulatory Visit
Admission: RE | Admit: 2023-03-10 | Discharge: 2023-03-10 | Disposition: A | Discharge status: Home / Self Care | Payer: Medicare Other | Source: Ambulatory Visit | Attending: Surgery | Admitting: Surgery

## 2023-03-10 ENCOUNTER — Ambulatory Visit: Payer: Medicare Other | Admitting: Physical Therapy

## 2023-03-10 ENCOUNTER — Ambulatory Visit: Payer: Medicare Other | Admitting: Dermatology

## 2023-03-10 DIAGNOSIS — K432 Incisional hernia without obstruction or gangrene: Secondary | ICD-10-CM

## 2023-03-10 DIAGNOSIS — R252 Cramp and spasm: Secondary | ICD-10-CM | POA: Diagnosis not present

## 2023-03-10 DIAGNOSIS — M25612 Stiffness of left shoulder, not elsewhere classified: Secondary | ICD-10-CM | POA: Diagnosis not present

## 2023-03-10 DIAGNOSIS — I700 Atherosclerosis of aorta: Secondary | ICD-10-CM | POA: Diagnosis not present

## 2023-03-10 DIAGNOSIS — M25512 Pain in left shoulder: Secondary | ICD-10-CM

## 2023-03-10 MED ORDER — IOPAMIDOL (ISOVUE-300) INJECTION 61%
100.0000 mL | Freq: Once | INTRAVENOUS | Status: AC | PRN
  Administered 2023-03-10: 100 mL via INTRAVENOUS

## 2023-03-16 NOTE — Progress Notes (Signed)
[name] [address] [place] [address] Phone: [phone] Subjective:   [name], am serving as a scribe for Dr. Antoine Primas.  I'm seeing this patient by the request  of:  Plotnikov, Georgina Quint, MD  CC: Bilateral knee pain  NGE:[id number]  2/29/20024 Injection given, tolerated the procedure well, discussed icing regimen and home exercises, increase activity slowly.  Seen if this is playing any role in patient's left shoulder pain.  Patient will be doing physical therapy for the rotator cuff tear that is underlying this.   Very concerned about the rotator cuff tear with patient continues to have weakness on exam.  Patient declined advanced imaging.  Would like to try formal physical therapy which patient did respond on the contralateral side.  Follow-up with me again in 6 to 8 weeks when he is done with physical therapy     Bilateral injections given for this chronic worsening pain. Still wants to avoid any surgical intervention. Continues to work on a regular basis. Cannot take time out of work at the moment. Discussed with patient that we will continue to work on the home exercises, continue to work on weight loss. Has responded to viscosupplementation as well previously. Follow-up again in 6 to 8 weeks   Update 03/17/2023 DEVAM CERAVOLO is a 72 y.o. male coming in with complaint of B knee and L shoulder pain. Patient states doing gel injections today. Shoulder is getting better. PT is helping, still a little weak, but ROM is better.     Past Medical History:  Diagnosis Date   Anxiety    Arthritis    knees   Blood transfusion without reported diagnosis    BPH (benign prostatic hypertrophy)    Cataract    "beginnings of"   Chronic kidney disease    kidney infection   Depression    GERD (gastroesophageal reflux disease)    Hypertension    Meralgia paresthetica of right side    SCCA (squamous cell carcinoma) of skin 11/05/2020    Left Thigh-Anterior (in situ) (curet and 5FU)   Past Surgical History:  Procedure Laterality Date   HERNIA REPAIR     inguinal, umbilical   SPLENECTOMY     Social History   Socioeconomic History   Marital status: Married    Spouse name: Not on file   Number of children: 2   Years of education: 12   Highest education level: 12th grade  Occupational History   Not on file  Tobacco Use   Smoking status: Never   Smokeless tobacco: Never  Vaping Use   Vaping Use: Never used  Substance and Sexual Activity   Alcohol use: Yes    Alcohol/week: 0.0 standard drinks of alcohol    Comment: occasional wine   Drug use: No   Sexual activity: Yes  Other Topics Concern   Not on file  Social History Narrative   Regular exercise-Yes (goes to the gym 3 times per week for 60 minutes)   Social Determinants of Health   Financial Resource Strain: Low Risk  (02/18/2023)   Overall Financial Resource Strain (CARDIA)    Difficulty of Paying Living Expenses: Not hard at all  Food Insecurity: No Food Insecurity (02/18/2023)   Hunger Vital Sign    Worried About Running Out of Food in the Last Year: Never true    Ran Out of Food in the Last Year: Never true  Transportation Needs: No Transportation Needs (02/18/2023)  PRAPARE - Administrator, Civil Service (Medical): No    Lack of Transportation (Non-Medical): No  Physical Activity: Sufficiently Active (02/18/2023)   Exercise Vital Sign    Days of Exercise per Week: 3 days    Minutes of Exercise per Session: 60 min  Stress: No Stress Concern Present (02/18/2023)   [employer] of Occupational Health - Occupational Stress Questionnaire    Feeling of Stress : Not at all  Social Connections: Socially Integrated (02/18/2023)   Social Connection and Isolation Panel [NHANES]    Frequency of Communication with Friends and Family: More than three times a week    Frequency of Social Gatherings with Friends and Family: More than three times a  week    Attends Religious Services: More than 4 times per year    Active Member of [employer] or Organizations: Yes    Attends Engineer, structural: More than 4 times per year    Marital Status: Married   Allergies  Allergen Reactions   Bupropion Hcl     Unsure of reaction   Lovastatin     REACTION: myalgia   Statins     weak   Venlafaxine     Unsure of reaction   Family History  Problem Relation Age of Onset   Heart disease Mother 64       chf   Hypertension Other    Colon cancer Neg Hx    Esophageal cancer Neg Hx    Stomach cancer Neg Hx    Rectal cancer Neg Hx      Current Outpatient Medications (Cardiovascular):    rosuvastatin (CRESTOR) 5 MG tablet, Take 1 tablet (5 mg total) by mouth daily.   telmisartan (MICARDIS) 80 MG tablet, TAKE 1 TABLET 80MG  BY MOUTH DAILY   triamterene-hydrochlorothiazide (MAXZIDE-25) 37.5-25 MG tablet, Take 1 tablet by mouth daily.   Current Outpatient Medications (Analgesics):    aspirin 81 MG tablet, Take 81 mg by mouth daily.   celecoxib (CELEBREX) 100 MG capsule, TAKE 1 CAPSULE(100 MG) BY MOUTH DAILY   Current Outpatient Medications (Other):    B Complex-Folic Acid (B COMPLEX PLUS) TABS, Take 1 tablet by mouth daily.   Cholecalciferol (VITAMIN D3) 2000 units TABS, Take 1 tablet by mouth.   finasteride (PROSCAR) 5 MG tablet, Take 1 tablet (5 mg total) by mouth daily. Overdue for yearly physical w/labs must see MF for refills   Fluorouracil (TOLAK) 4 % CREA, Apply 1 application. topically daily. Qhs x 28 days   imiquimod (ALDARA) 5 % cream, Apply to thigh 3 nights a wk for 8 wks   Omega-3 Fatty Acids (FISH OIL) 1000 MG CAPS, Take 1 capsule by mouth daily.   omeprazole (PRILOSEC) 20 MG capsule, Take 20 mg by mouth daily.   tamsulosin (FLOMAX) 0.4 MG CAPS capsule, Take 1 capsule (0.4 mg total) by mouth daily.   Reviewed prior external information including notes and imaging from  primary care provider As well as notes that were  available from care everywhere and other healthcare systems.  Past medical history, social, surgical and family history all reviewed in electronic medical record.  No pertanent information unless stated regarding to the chief complaint.   Review of Systems:  No headache, visual changes, nausea, vomiting, diarrhea, constipation, dizziness, abdominal pain, skin rash, fevers, chills, night sweats, weight loss, swollen lymph nodes, body aches, joint swelling, chest pain, shortness of breath, mood changes. POSITIVE muscle aches  Objective  Blood pressure 130/76, pulse 73, height  6' (1.829 m), weight 245 lb (111.1 kg), SpO2 95 %.   General: No apparent distress alert and oriented x3 mood and affect normal, dressed appropriately.  HEENT: Pupils equal, extraocular movements intact  Respiratory: Patient's speak in full sentences and does not appear short of breath  Cardiovascular: No lower extremity edema, non tender, no erythema  Bilateral knee exams do have arthritic changes noted.  Trace effusion of the knees bilaterally.  No instability with valgus and varus force  After informed written and verbal consent, patient was seated on exam table. Right knee was prepped with alcohol swab and utilizing anterolateral approach, patient's right knee space was injected with 48 mg per 3 mL of Monovisc (sodium hyaluronate) in a prefilled syringe was injected easily into the knee through a 22-gauge needle..Patient tolerated the procedure well without immediate complications.  After informed written and verbal consent, patient was seated on exam table. Left knee was prepped with alcohol swab and utilizing anterolateral approach, patient's left knee space was injected with 48 mg per 3 mL of Monovisc (sodium hyaluronate) in a prefilled syringe was injected easily into the knee through a 22-gauge needle..Patient tolerated the procedure well without immediate complications.    Impression and Recommendations:    The  above documentation has been reviewed and is accurate and complete Judi Saa, DO

## 2023-03-17 ENCOUNTER — Ambulatory Visit: Payer: Medicare Other | Admitting: Family Medicine

## 2023-03-17 ENCOUNTER — Encounter: Payer: Self-pay | Admitting: Family Medicine

## 2023-03-17 ENCOUNTER — Ambulatory Visit (INDEPENDENT_AMBULATORY_CARE_PROVIDER_SITE_OTHER): Payer: Medicare Other | Admitting: Family Medicine

## 2023-03-17 VITALS — BP 130/76 | HR 73 | Ht 72.0 in | Wt 245.0 lb

## 2023-03-17 DIAGNOSIS — G8929 Other chronic pain: Secondary | ICD-10-CM | POA: Diagnosis not present

## 2023-03-17 DIAGNOSIS — M25562 Pain in left knee: Secondary | ICD-10-CM | POA: Diagnosis not present

## 2023-03-17 DIAGNOSIS — M25561 Pain in right knee: Secondary | ICD-10-CM | POA: Diagnosis not present

## 2023-03-17 DIAGNOSIS — K432 Incisional hernia without obstruction or gangrene: Secondary | ICD-10-CM | POA: Diagnosis not present

## 2023-03-17 DIAGNOSIS — M170 Bilateral primary osteoarthritis of knee: Secondary | ICD-10-CM

## 2023-03-17 DIAGNOSIS — K4090 Unilateral inguinal hernia, without obstruction or gangrene, not specified as recurrent: Secondary | ICD-10-CM | POA: Diagnosis not present

## 2023-03-17 MED ORDER — HYALURONAN 88 MG/4ML IX SOSY
176.0000 mg | PREFILLED_SYRINGE | Freq: Once | INTRA_ARTICULAR | Status: AC
Start: 2023-03-17 — End: 2023-03-17
  Administered 2023-03-17: 176 mg via INTRA_ARTICULAR

## 2023-03-17 NOTE — Patient Instructions (Addendum)
Good to see you! Continue PT Gel injections in knees today See you again in 6-8 weeks and we can look at shoulder

## 2023-03-17 NOTE — Assessment & Plan Note (Signed)
Patient mated nearly a year at this time.  Discussed icing regimen and home exercises, discussed which activities to do and which ones to avoid.  Increase activity slowly.  Follow-up again 6 to 8 weeks.  Can repeat steroid injections if needed.

## 2023-03-18 ENCOUNTER — Ambulatory Visit: Payer: Medicare Other | Admitting: Physical Therapy

## 2023-03-29 ENCOUNTER — Ambulatory Visit: Payer: Medicare Other | Attending: Family Medicine | Admitting: Physical Therapy

## 2023-03-29 ENCOUNTER — Encounter: Payer: Self-pay | Admitting: Physical Therapy

## 2023-03-29 DIAGNOSIS — R252 Cramp and spasm: Secondary | ICD-10-CM | POA: Insufficient documentation

## 2023-03-29 DIAGNOSIS — M25512 Pain in left shoulder: Secondary | ICD-10-CM | POA: Insufficient documentation

## 2023-03-29 DIAGNOSIS — M25612 Stiffness of left shoulder, not elsewhere classified: Secondary | ICD-10-CM | POA: Insufficient documentation

## 2023-03-29 NOTE — Therapy (Addendum)
OUTPATIENT PHYSICAL THERAPY UPPER EXTREMITY TREATMENT AND DISCHARGE SUMMARY  Patient Name: Jeffrey [name] MRN: [id number] DOB:1951/03/31, 72 y.o., male Today's Date: 03/29/2023  END OF SESSION:  PT End of Session - 03/29/23 1315     Visit Number 14    Number of Visits 20    Date for PT Re-Evaluation 04/07/23    Authorization Type MCR    Progress Note Due on Visit 20    PT Start Time 1315    PT Stop Time 1357    PT Time Calculation (min) 42 min    Activity Tolerance Patient tolerated treatment well    Behavior During Therapy WFL for tasks assessed/performed                  Past Medical History:  Diagnosis Date   Anxiety    Arthritis    knees   Blood transfusion without reported diagnosis    BPH (benign prostatic hypertrophy)    Cataract    "beginnings of"   Chronic kidney disease    kidney infection   Depression    GERD (gastroesophageal reflux disease)    Hypertension    Meralgia paresthetica of right side    SCCA (squamous cell carcinoma) of skin 11/05/2020   Left Thigh-Anterior (in situ) (curet and 5FU)   Past Surgical History:  Procedure Laterality Date   HERNIA REPAIR     inguinal, umbilical   SPLENECTOMY     Patient Active Problem List   Diagnosis Date Noted   Bilateral carotid bruits 01/13/2023   AC (acromioclavicular) arthritis 12/24/2022   Left rotator cuff tear 10/13/2022   Degenerative arthritis of knee 02/25/2021   Coronary artery disease due to lipid rich plaque 01/13/2021   Renal mass, right 12/17/2020   RLQ abdominal pain 11/08/2020   Umbilical hernia 11/08/2020   Neoplasm of uncertain behavior of skin 05/01/2020   Trigger finger of left hand 05/01/2020   Dyslipidemia 07/20/2019   Aortic atherosclerosis (HCC) 04/14/2018   Right rotator cuff tear 01/13/2018   Abdominal pain 03/18/2016   Cervical disc disorder with radiculopathy of cervical region 12/06/2015   Edema 12/06/2015   Lower back pain 07/04/2015   Injury of popliteal  region 12/07/2014   Right knee pain 11/13/2013   Pneumonia 12/21/2012   Fatigue 12/21/2012   Dry mouth 12/21/2012   Actinic keratoses 09/09/2012   Well adult exam 06/25/2011   History of splenectomy 06/25/2011   PSA elevation 06/25/2011   Ventral hernia 05/23/2010   Meralgia paresthetica 03/19/2009   Anxiety state 06/25/2007   ERECTILE DYSFUNCTION 06/25/2007   DEPRESSION 06/25/2007   Essential hypertension 06/25/2007   GERD 06/25/2007   BPH (benign prostatic hyperplasia) 06/25/2007    PCP: Tresa Garter, MD  REFERRING PROVIDER: Judi Saa, DO  REFERRING DIAG: [id number] (ICD-10-CM) - Acute pain of left shoulder   THERAPY DIAG:  Acute pain of left shoulder  Cramp and spasm  Stiffness of left shoulder, not elsewhere classified  RATIONALE FOR EVALUATION AND TREATMENT: Rehabilitation  ONSET DATE: December 2023  NEXT MD VISIT: TBD ~3 months from 03/17/23   SUBJECTIVE:  SUBJECTIVE STATEMENT: I think I slept wrong on my shoulder - it hurts a little today.   PAIN:  Are you having pain? Yes: NPRS scale:  3/10 Pain location: left shoulder deltoid area Pain description: sharp Aggravating factors: horizontal ABD, OH Relieving factors: Aldara cream  PERTINENT HISTORY: Scoliosis, HTN, OA  PRECAUTIONS: None  WEIGHT BEARING RESTRICTIONS: No  FALLS:  Has patient fallen in last 6 months? No  LIVING ENVIRONMENT: Lives with: lives with their spouse Lives in: House/apartment Stairs:  N/A Has following equipment at home:  N/A  OCCUPATION: Works part time as Merchandiser, retail at [employer]  PLOF: Independent  PATIENT GOALS: get it as good as the right   OBJECTIVE:   DIAGNOSTIC FINDINGS:  XR 10/14/22 IMPRESSION: 1. Mild glenohumeral and acromioclavicular osteoarthritis. 2.  Likely old healed fracture of the left clavicle midshaft.  PATIENT SURVEYS :  Quick Dash  20.5 / 100 = 20.5 %   03/01/23  20.5 / 100 = 20.5 %  COGNITION: Overall cognitive status: Within functional limits for tasks assessed     SENSATION: WFL  POSTURE: Rounded shoulders, Fwd head, scoliosis resulting in depressed left shoulder and scap  UPPER EXTREMITY ROM:   A/P ROM Right eval Left   eval Left  01/25/23 Left 02/08/23 Left 03/01/23  Shoulder flexion  180  120 standing 180 supine; 122 standing  Shoulder extension  FULL     Shoulder abduction  125/150 150/180 125 standing 140 supine; 137 in standing   Shoulder adduction       Shoulder internal rotation  84/90 80  80  Shoulder external rotation  38/55 75  73  Elbow flexion  FULL     Elbow extension  FULL     (Blank rows = not tested)  UPPER EXTREMITY MMT:  MMT Left eval Left  02/08/23 Left 02/24/23 Left  03/01/23  Shoulder flexion 4-* 4+  5  Shoulder extension 4+ 5    Shoulder abduction 3+* 4+ 4+ 4+  Shoulder adduction      Shoulder internal rotation 4 4+  5  Shoulder external rotation 3+ 3+ 4 4  Middle trapezius      Lower trapezius      Elbow flexion 5     Elbow extension 5     (Blank rows = not tested) * = PAIN (mild with ABD and ER)  SHOULDER SPECIAL TESTS: Rotator cuff assessment: Drop arm test: negative, Empty can test: negative, Full can test: positive , and Gerber lift off test: positive   JOINT MOBILITY TESTING:  WNL  PALPATION:  L pects, UT, subscap, lats, medial scapular mm.   TODAY'S TREATMENT:                                                                                                                                         DATE:   03/18/23 THERAPEUTIC EXERCISE: to improve flexibility, strength  and mobility.  Verbal and tactile cues throughout for technique.  UBE - L5 x 6 min (3' each fwd & back) Supine with HOB elevated 30: Diagonals R x 15 RTB (D2 flex with rotation) Scap retraction + RTB B  shoulder ER 2 x 10 Horizontal ABD GTB x 18 L shoulder circles at 90-110 flexion 4 x 5 Prone over green Pball on hi/low table Scap retraction + shoulder extension (I's) 2# x25 bil  Scap retraction + shoulder horiz ABD (T's) 2# x25 bil  Scap retraction + shoulder ER (W's) 2# x20 bil BATCA Low rows 25# 2 x 10 High rows 25# 3x10 Standing lat pull 25# x 20  Seated lat pull 25# x 20    03/10/23 UBE L5 x 6 min Standing: L biceps stretch x 30 sec At wall: 3 way reaches with wash cloth with 1# wrist wt x 13 (fatigued) OH reaching with Squigz on cabinet door to fatigue 3 way to 90 degrees x 10 ea L only thumb up  BATCA Low Rows 25# 2 x 10 High rows 25# 3x10 Lat pull 25# x 20 seated  Supine: Horizontal ABD GTB x 25 Hands behind head pec stretch 1x30 sec OH weighted ball pass x 20 Red ball (not very challenging) OH flexion (lat pull) 6# DB hooklying  x 20 Diagonals L x 20 500 gram ball  (D2 flex with rotation) then  with 3# DB x 10 (more challenging)  Manual: STM to L biceps x 8 min   03/08/23 UBE L4 x 6 min Standing: At wall: 3 way reaches with wash cloth x 5 , then with 1# wrist wt x 9 (fatigued) OH reaching with Squigz on door to fatigue  Manual Therapy: to decrease muscle spasm and pain and improve mobility Skilled palpation and monitoring of soft tissues during DN STM and TPR to L UT, lats, rhomboids, LS and infraspinatus/teres Trigger Point Dry-Needling  Treatment instructions: Expect mild to moderate muscle soreness. S/S of pneumothorax if dry needled over a lung field, and to seek immediate medical attention should they occur. Patient verbalized understanding of these instructions and education. Patient Consent Given: Yes Education handout provided: Previously provided Muscles treated: L IS, lats, LS, UT, subscapularis, teres Electrical stimulation performed: No Parameters: N/A Treatment response/outcome: Twitch Response Elicited and Palpable Increase in Muscle  Length   PATIENT EDUCATION: Education details: HEP update' Person educated: Patient Education method: Explanation, Demonstration, and Handouts Education comprehension: verbalized understanding and returned demonstration  HOME EXERCISE PROGRAM: Access Code: [email or web address] URL: [email or web address] Date: 02/17/2023 Prepared by: Raynelle Fanning  Exercises - Isometric Shoulder Abduction - Arm Straight at Wall (Mirrored)  - 2 x daily - 7 x weekly - 1 sets - 5 reps - 10 sec  hold - Standing Isometric Shoulder External Rotation with Doorway (Mirrored)  - 2 x daily - 7 x weekly - 1 sets - 5 reps - 10 sec  hold - Single Arm Punch with Resistance  - 1 x daily - 7 x weekly - 1-3 sets - 10 reps - Single Arm Shoulder Extension with Resistance  - 1 x daily - 7 x weekly - 1-3 sets - 10 reps - Shoulder Internal Rotation with Resistance  - 1 x daily - 7 x weekly - 1-3 sets - 10 reps - Supine Shoulder Horizontal Abduction with Resistance  - 1 x daily - 3 x weekly - 3 sets - 10 reps - Sidelying Shoulder External Rotation  - 1 x daily - 3 x weekly -  3 sets - 10 reps - Sidelying Shoulder Horizontal Abduction  - 1 x daily - 3 x weekly - 1-3 sets - 10 reps - Standing Shoulder Flexion to 90 Degrees  - 1 x daily - 3 x weekly - 1-3 sets - 10 reps - Standing Shoulder Scaption  - 1 x daily - 3 x weekly - 1-3 sets - 10 reps - Shoulder Abduction - Thumbs Up  - 1 x daily - 3 x weekly - 1-3 sets - 10 reps - Standing Wall Ball Circles with Plyo Ball  - 1 x daily - 7 x weekly - 2 sets - 10 reps - Seated Shoulder Flexion Full Range with yellow loop  - 1 x daily - 4 x weekly - 1-3 sets - 10 reps  DO LYING ON YOUR BACK   Robber: Scapular Retraction: Elbow Flexion (Standing)   With elbows bent to 90, pinch shoulder blades together and rotate arms out, keeping elbows bent. Repeat _10___ times per set. Do _1-3___ sets per session. Do __1__ sessions per day. May use __2___ pound weights.    ASSESSMENT:  CLINICAL  IMPRESSION: Cecilio Asper" noting some more difficulty with the exercises today but does note he worked a lot in the yard/garden over the weekend, but overall notes significant improvement in the He reports no concern with the HEP and feels like he would be comfortable transitioning to his HEP upon completion of his final scheduled visit later this week. We verbally review the HEP and highlighted proper use of the gym equipment for when he returns to the gym. Will plan for final goal assessment and address any remaining concerns next visit with anticipated transition to HEP +/- 30-day hold.  OBJECTIVE IMPAIRMENTS: decreased ROM, decreased strength, increased muscle spasms, impaired flexibility, impaired UE functional use, postural dysfunction, and pain.   ACTIVITY LIMITATIONS: carrying, lifting, and reach over head  PARTICIPATION LIMITATIONS: occupation  PERSONAL FACTORS: 3+ comorbidities: HTN, OA, scoliosis  are also affecting patient's functional outcome.   REHAB POTENTIAL: Good  CLINICAL DECISION MAKING: Evolving/moderate complexity  EVALUATION COMPLEXITY: Low   GOALS: Goals reviewed with patient? Yes  SHORT TERM GOALS: Target date: 01/27/2023   Patient will be independent with initial HEP.  Baseline:  Goal status: MET  LONG TERM GOALS: Target date: 03/10/2023   Patient will be independent with advanced/ongoing HEP to improve outcomes and carryover.  Baseline:  Goal status: MET  2.  Patient will report 75% improvement in L shoulder pain to improve QOL.  Baseline:  Goal status: Partially MET 70% improvement 03/01/23  3.  Patient to demonstrate improved upright posture with posterior shoulder girdle engaged to promote improved glenohumeral joint mobility. Baseline:  Goal status: NOT MET 03/01/23  4.  Patient to improve L shoulder AROM to [facility] without pain provocation to allow for increased ease of ADLs including washing hair and reaching out to drive-thru window.  Baseline: limited  with ABD, ER and standing flex Goal status: Partially MET 02/08/23 able to wash hair without pain; drive thru motion no pain. 03/01/23 He has pain when lying on his left side and propping up, too much exercise or activity.   5.  Patient will demonstrate improved UE strength to 4+/5. Baseline:  Goal status: Partially MET  03/01/23 ER still 4/5  6  Patient will report 10 on Quick Dash to demonstrate improved functional ability.  Baseline:  20.5 / 100 = 20.5 % Goal status: Not MET 03/01/23 20.5 / 100 = 20.5 %  PLAN: PT FREQUENCY: 2x/week  PT DURATION:  12 sessions  PLANNED INTERVENTIONS: Therapeutic exercises, Therapeutic activity, Neuromuscular re-education, Patient/Family education, Self Care, Joint mobilization, Dry Needling, Electrical stimulation, Spinal mobilization, Cryotherapy, Moist heat, Taping, Ultrasound, Ionotophoresis 4mg /ml Dexamethasone, and Manual therapy  PLAN FOR NEXT SESSION: goal assessment, anticipate transition to HEP +/- 30-day hold  Marry Guan, PT 03/29/2023, 1:58 PM  PHYSICAL THERAPY DISCHARGE SUMMARY  Visits from Start of Care: 14  Current functional level related to goals / functional outcomes: See Above   Remaining deficits: See above   Education / Equipment: HEP   Patient agrees to discharge. Patient goals were partially met. Patient is being discharged due to being pleased with the current functional level.  Solon Palm, PT 04/26/23 3:13 PM [facility] [address] [place], [place] [address] [phone]  Fax: [phone]

## 2023-04-01 ENCOUNTER — Ambulatory Visit: Payer: Medicare Other | Admitting: Physical Therapy

## 2023-04-05 DIAGNOSIS — R972 Elevated prostate specific antigen [PSA]: Secondary | ICD-10-CM | POA: Diagnosis not present

## 2023-04-12 DIAGNOSIS — N401 Enlarged prostate with lower urinary tract symptoms: Secondary | ICD-10-CM | POA: Diagnosis not present

## 2023-04-12 DIAGNOSIS — R972 Elevated prostate specific antigen [PSA]: Secondary | ICD-10-CM | POA: Diagnosis not present

## 2023-04-12 DIAGNOSIS — R350 Frequency of micturition: Secondary | ICD-10-CM | POA: Diagnosis not present

## 2023-04-12 DIAGNOSIS — N5201 Erectile dysfunction due to arterial insufficiency: Secondary | ICD-10-CM | POA: Diagnosis not present

## 2023-06-03 ENCOUNTER — Ambulatory Visit (INDEPENDENT_AMBULATORY_CARE_PROVIDER_SITE_OTHER): Payer: Medicare Other | Admitting: Internal Medicine

## 2023-06-03 ENCOUNTER — Encounter: Payer: Self-pay | Admitting: Internal Medicine

## 2023-06-03 VITALS — BP 130/78 | HR 60 | Temp 98.2°F | Ht 72.0 in | Wt 252.0 lb

## 2023-06-03 DIAGNOSIS — R944 Abnormal results of kidney function studies: Secondary | ICD-10-CM | POA: Diagnosis not present

## 2023-06-03 DIAGNOSIS — I2510 Atherosclerotic heart disease of native coronary artery without angina pectoris: Secondary | ICD-10-CM | POA: Diagnosis not present

## 2023-06-03 DIAGNOSIS — M545 Low back pain, unspecified: Secondary | ICD-10-CM | POA: Diagnosis not present

## 2023-06-03 DIAGNOSIS — I2583 Coronary atherosclerosis due to lipid rich plaque: Secondary | ICD-10-CM | POA: Diagnosis not present

## 2023-06-03 LAB — URINALYSIS
Bilirubin Urine: NEGATIVE
Hgb urine dipstick: NEGATIVE
Ketones, ur: NEGATIVE
Leukocytes,Ua: NEGATIVE
Nitrite: NEGATIVE
Specific Gravity, Urine: 1.015 (ref 1.000–1.030)
Total Protein, Urine: NEGATIVE
Urine Glucose: NEGATIVE
Urobilinogen, UA: 0.2 (ref 0.0–1.0)
pH: 6.5 (ref 5.0–8.0)

## 2023-06-03 LAB — COMPREHENSIVE METABOLIC PANEL
ALT: 12 U/L (ref 0–53)
AST: 20 U/L (ref 0–37)
Albumin: 4 g/dL (ref 3.5–5.2)
Alkaline Phosphatase: 75 U/L (ref 39–117)
BUN: 22 mg/dL (ref 6–23)
CO2: 30 mEq/L (ref 19–32)
Calcium: 9.6 mg/dL (ref 8.4–10.5)
Chloride: 101 mEq/L (ref 96–112)
Creatinine, Ser: 1.13 mg/dL (ref 0.40–1.50)
GFR: 64.55 mL/min (ref >60.00)
Glucose, Bld: 106 mg/dL — ABNORMAL HIGH (ref 70–99)
Potassium: 4 mEq/L (ref 3.5–5.1)
Sodium: 137 mEq/L (ref 135–145)
Total Bilirubin: 0.6 mg/dL (ref 0.2–1.2)
Total Protein: 7.3 g/dL (ref 6.0–8.3)

## 2023-06-03 LAB — SEDIMENTATION RATE: Sed Rate: 18 mm/hr (ref 0–20)

## 2023-06-03 NOTE — Assessment & Plan Note (Addendum)
Chronic L flank pain - worse. Now it is 2/10. Likely OA/MSK F/u on hernia. Aleve PM helps. Pt had abd CT in May 2024 Labs LS X ray

## 2023-06-03 NOTE — Assessment & Plan Note (Signed)
New Hydrate well. Repeat GFR

## 2023-06-03 NOTE — Assessment & Plan Note (Signed)
On Crestor 

## 2023-06-03 NOTE — Progress Notes (Signed)
10. May go up to 7/10 in intensity  Subjective:  Patient ID: Jeffrey [name], male    DOB: 24-Feb-1951  Age: 72 y.o. MRN: [id number]  CC: Back Pain   HPI [name] presents for chronic L flank pain - worse. Now it is 2. F/u on hernia. Aleve PM helps. Pt had abd CT in May 2024  Outpatient Medications Prior to Visit  Medication Sig Dispense Refill   aspirin 81 MG tablet Take 81 mg by mouth daily.     B Complex-Folic Acid (B COMPLEX PLUS) TABS Take 1 tablet by mouth daily. 100 tablet 3   celecoxib (CELEBREX) 100 MG capsule TAKE 1 CAPSULE(100 MG) BY MOUTH DAILY 90 capsule 1   Cholecalciferol (VITAMIN D3) 2000 units TABS Take 1 tablet by mouth.     finasteride (PROSCAR) 5 MG tablet Take 1 tablet (5 mg total) by mouth daily. Overdue for yearly physical w/labs must see MF for refills 90 tablet 3   Fluorouracil (TOLAK) 4 % CREA Apply 1 application. topically daily. Qhs x 28 days 40 g 1   imiquimod (ALDARA) 5 % cream Apply to thigh 3 nights a wk for 8 wks 6 each 0   Omega-3 Fatty Acids (FISH OIL) 1000 MG CAPS Take 1 capsule by mouth daily.     omeprazole (PRILOSEC) 20 MG capsule Take 20 mg by mouth daily.     rosuvastatin (CRESTOR) 5 MG tablet Take 1 tablet (5 mg total) by mouth daily. 90 tablet 3   tamsulosin (FLOMAX) 0.4 MG CAPS capsule Take 1 capsule (0.4 mg total) by mouth daily. 90 capsule 1   telmisartan (MICARDIS) 80 MG tablet TAKE 1 TABLET 80MG  BY MOUTH DAILY 90 tablet 3   triamterene-hydrochlorothiazide (MAXZIDE-25) 37.5-25 MG tablet Take 1 tablet by mouth daily. 90 tablet 3   No facility-administered medications prior to visit.    ROS: Review of Systems  Constitutional:  Negative for appetite change, fatigue and unexpected weight change.  HENT:  Negative for congestion, nosebleeds, sneezing, sore throat and trouble swallowing.   Eyes:  Negative for itching and visual disturbance.  Respiratory:  Negative for cough.   Cardiovascular:  Negative for chest pain, palpitations and  leg swelling.  Gastrointestinal:  Negative for abdominal distention, blood in stool, diarrhea and nausea.  Genitourinary:  Negative for frequency and hematuria.  Musculoskeletal:  Positive for arthralgias, back pain and gait problem. Negative for joint swelling and neck pain.  Skin:  Negative for rash.  Neurological:  Negative for dizziness, tremors, speech difficulty and weakness.  Psychiatric/Behavioral:  Negative for agitation, dysphoric mood and sleep disturbance. The patient is not nervous/anxious.     Objective:  BP 130/78 (BP Location: Right Arm, Patient Position: Sitting, Cuff Size: Large)   Pulse 60   Temp 98.2 F (36.8 C) (Oral)   Ht 6' (1.829 m)   Wt 252 lb (114.3 kg)   SpO2 96%   BMI 34.18 kg/m   BP Readings from Last 3 Encounters:  06/03/23 130/78  03/17/23 130/76  01/13/23 118/76    Wt Readings from Last 3 Encounters:  06/03/23 252 lb (114.3 kg)  03/17/23 245 lb (111.1 kg)  02/18/23 248 lb (112.5 kg)    Physical Exam Constitutional:      General: He is not in acute distress.    Appearance: He is well-developed. He is obese.     Comments: NAD  Eyes:     Conjunctiva/sclera: Conjunctivae normal.     Pupils: Pupils are equal, round,  and reactive to light.  Neck:     Thyroid: No thyromegaly.     Vascular: No JVD.  Cardiovascular:     Rate and Rhythm: Normal rate and regular rhythm.     Heart sounds: Normal heart sounds. No murmur heard.    No friction rub. No gallop.  Pulmonary:     Effort: Pulmonary effort is normal. No respiratory distress.     Breath sounds: Normal breath sounds. No wheezing or rales.  Chest:     Chest wall: No tenderness.  Abdominal:     General: Bowel sounds are normal. There is no distension.     Palpations: Abdomen is soft. There is no mass.     Tenderness: There is no abdominal tenderness. There is no guarding or rebound.  Musculoskeletal:        General: Tenderness present. Normal range of motion.     Cervical back: Normal  range of motion.  Lymphadenopathy:     Cervical: No cervical adenopathy.  Skin:    General: Skin is warm and dry.     Findings: No rash.  Neurological:     Mental Status: He is alert and oriented to person, place, and time.     Cranial Nerves: No cranial nerve deficit.     Motor: No abnormal muscle tone.     Coordination: Coordination normal.     Gait: Gait normal.     Deep Tendon Reflexes: Reflexes are normal and symmetric.  Psychiatric:        Behavior: Behavior normal.        Thought Content: Thought content normal.        Judgment: Judgment normal.   LS tender w/ROM Ventral hernia  Lab Results  Component Value Date   WBC 9.2 02/02/2023   HGB 14.3 02/02/2023   HCT 43.4 02/02/2023   PLT 359.0 02/02/2023   GLUCOSE 91 02/02/2023   CHOL 117 02/02/2023   TRIG 52.0 02/02/2023   HDL 48.50 02/02/2023   LDLDIRECT 151.8 05/15/2010   LDLCALC 59 02/02/2023   ALT 12 02/02/2023   AST 18 02/02/2023   NA 141 02/02/2023   K 4.5 02/02/2023   CL 101 02/02/2023   CREATININE 1.40 02/02/2023   BUN 17 02/02/2023   CO2 33 (H) 02/02/2023   TSH 1.62 02/02/2023   PSA 4.01 (H) 02/02/2023   INR 0.93 08/12/2010    CT ABDOMEN PELVIS W CONTRAST  Result Date: 03/12/2023 CLINICAL DATA:  Incisional hernia.  Previous splenectomy. EXAM: CT ABDOMEN AND PELVIS WITH CONTRAST TECHNIQUE: Multidetector CT imaging of the abdomen and pelvis was performed using the standard protocol following bolus administration of intravenous contrast. RADIATION DOSE REDUCTION: This exam was performed according to the departmental dose-optimization program which includes automated exposure control, adjustment of the mA and/or kV according to patient size and/or use of iterative reconstruction technique. CONTRAST:  ISOVUE-300 IOPAMIDOL (ISOVUE-300) INJECTION 61% COMPARISON:  12/02/2020 FINDINGS: Lower Chest: No acute findings. Hepatobiliary: No hepatic masses identified. 1.7 cm calcified gallstone again seen, however  there is no evidence of cholecystitis or biliary ductal dilatation. Pancreas: 11 mm cystic lesion in the pancreatic tail on image 16/2 remains stable, most likely representing an indolent side-branch IPMN. Otherwise unremarkable. Spleen: Prior splenectomy. Adrenals/Urinary Tract: Stable small benign-appearing right renal cysts again noted (No followup imaging is recommended). No suspicious masses identified. No evidence of ureteral calculi or hydronephrosis. Stomach/Bowel: Small hiatal hernia again noted. No evidence of obstruction, inflammatory process or abnormal fluid collections. Mild sigmoid diverticulosis  is again noted, however there is no evidence of diverticulitis. Vascular/Lymphatic: No pathologically enlarged lymph nodes. No acute vascular findings. Aortic atherosclerotic calcification incidentally noted. Reproductive:  Stable mild-to-moderately enlarged prostate gland. Other: Anterior abdominal wall surgical mesh again seen. A small midline ventral hernia is seen just inferior to the surgical mesh, which contains a single small bowel loop. No evidence of bowel obstruction or strangulation. Small left inguinal hernia which contains only fat is unchanged. Musculoskeletal:  No suspicious bone lesions identified. IMPRESSION: Stable small midline ventral hernia just inferior to surgical mesh, which contains a single small bowel loop. No evidence of bowel obstruction or strangulation. Stable small left inguinal hernia, which contains only fat. Stable small hiatal hernia. Cholelithiasis. No radiographic evidence of cholecystitis. Stable enlarged prostate. Colonic diverticulosis, without radiographic evidence of diverticulitis. Aortic Atherosclerosis (ICD10-I70.0). Stable 11 mm cystic lesion in pancreatic tail, most likely representing an indolent side-branch IPMN. Recommend continued imaging follow-up with abdomen CT or MRI in 2 years. This recommendation follows ACR consensus guidelines: Management of  Incidental Pancreatic Cysts: A White Paper of the ACR Incidental Findings Committee. J Am Coll Radiol 2017;14:911-923. Electronically Signed   By: Danae Orleans M.D.   On: 03/12/2023 11:06    Assessment & Plan:   Problem List Items Addressed This Visit     Low back pain - Primary    Chronic L flank pain - worse. Now it is 2/10. Likely OA/MSK F/u on hernia. Aleve PM helps. Pt had abd CT in May 2024 Labs LS X ray      Relevant Orders   DG Lumbar Spine 2-3 Views   Coronary artery disease due to lipid rich plaque    On Crestor      Decreased GFR    New Hydrate well. Repeat GFR      Relevant Orders   Comprehensive metabolic panel   Sedimentation rate   Urinalysis      No orders of the defined types were placed in this encounter.     Follow-up: Return in about 6 weeks (around 07/15/2023) for a follow-up visit.  Sonda Primes, MD

## 2023-07-22 ENCOUNTER — Other Ambulatory Visit: Payer: Self-pay | Admitting: Internal Medicine

## 2023-07-28 NOTE — Progress Notes (Unsigned)
[name] [address] [place] [address] Phone: [phone] Subjective:   Jeffrey [name], am serving as a scribe for Dr. Antoine [name].  I'm seeing this patient by the request  of:  Jeffrey [name], Jeffrey Quint, MD  CC: Bilateral knee pain follow-up  [id number]  03/17/2023 Patient mated nearly a year at this time.  Discussed icing regimen and home exercises, discussed which activities to do and which ones to avoid.  Increase activity slowly.  Follow-up again 6 to 8 weeks.  Can repeat steroid injections if needed.     Updated 07/29/2023 Jeffrey [name] is a 72 y.o. male coming in with complaint of B knee pain. Here for injections patient has had difficulty again.  And starting affect daily activities.  Continuing to try to stay.  Active.       Past Medical History:  Diagnosis Date   Anxiety    Arthritis    knees   Blood transfusion without reported diagnosis    BPH (benign prostatic hypertrophy)    Cataract    "beginnings of"   Chronic kidney disease    kidney infection   Depression    GERD (gastroesophageal reflux disease)    Hypertension    Meralgia paresthetica of right side    SCCA (squamous cell carcinoma) of skin 11/05/2020   Left Thigh-Anterior (in situ) (curet and 5FU)   Past Surgical History:  Procedure Laterality Date   HERNIA REPAIR     inguinal, umbilical   SPLENECTOMY     Social History   Socioeconomic History   Marital status: Married    Spouse name: Not on file   Number of children: 2   Years of education: 12   Highest education level: 12th grade  Occupational History   Not on file  Tobacco Use   Smoking status: Never   Smokeless tobacco: Never  Vaping Use   Vaping status: Never Used  Substance and Sexual Activity   Alcohol use: Yes    Alcohol/week: 0.0 standard drinks of alcohol    Comment: occasional wine   Drug use: No   Sexual activity: Yes  Other Topics Concern   Not on file  Social History  Narrative   Regular exercise-Yes (goes to the gym 3 times per week for 60 minutes)   Social Determinants of Health   Financial Resource Strain: Low Risk  (02/18/2023)   Overall Financial Resource Strain (CARDIA)    Difficulty of Paying Living Expenses: Not hard at all  Food Insecurity: No Food Insecurity (02/18/2023)   Hunger Vital Sign    Worried About Running Out of Food in the Last Year: Never true    Ran Out of Food in the Last Year: Never true  Transportation Needs: No Transportation Needs (02/18/2023)   PRAPARE - Administrator, Civil Service (Medical): No    Lack of Transportation (Non-Medical): No  Physical Activity: Sufficiently Active (02/18/2023)   Exercise Vital Sign    Days of Exercise per Week: 3 days    Minutes of Exercise per Session: 60 min  Stress: No Stress Concern Present (02/18/2023)   [employer] of Occupational Health - Occupational Stress Questionnaire    Feeling of Stress : Not at all  Social Connections: Socially Integrated (02/18/2023)   Social Connection and Isolation Panel [NHANES]    Frequency of Communication with Friends and Family: More than three times a week    Frequency of Social Gatherings with Friends and Family: More  than three times a week    Attends Religious Services: More than 4 times per year    Active Member of Clubs or Organizations: Yes    Attends Engineer, structural: More than 4 times per year    Marital Status: Married   Allergies  Allergen Reactions   Bupropion Hcl     Unsure of reaction   Lovastatin     REACTION: myalgia   Statins     weak   Venlafaxine     Unsure of reaction   Family History  Problem Relation Age of Onset   Heart disease Mother 37       chf   Hypertension Other    Colon cancer Neg Hx    Esophageal cancer Neg Hx    Stomach cancer Neg Hx    Rectal cancer Neg Hx      Current Outpatient Medications (Cardiovascular):    rosuvastatin (CRESTOR) 5 MG tablet, TAKE 1 TABLET(5 MG) BY  MOUTH DAILY.   telmisartan (MICARDIS) 80 MG tablet, TAKE 1 TABLET 80MG  BY MOUTH DAILY   triamterene-hydrochlorothiazide (MAXZIDE-25) 37.5-25 MG tablet, Take 1 tablet by mouth daily.   Current Outpatient Medications (Analgesics):    aspirin 81 MG tablet, Take 81 mg by mouth daily.   celecoxib (CELEBREX) 100 MG capsule, TAKE 1 CAPSULE(100 MG) BY MOUTH DAILY   Current Outpatient Medications (Other):    B Complex-Folic Acid (B COMPLEX PLUS) TABS, Take 1 tablet by mouth daily.   Cholecalciferol (VITAMIN D3) 2000 units TABS, Take 1 tablet by mouth.   finasteride (PROSCAR) 5 MG tablet, Take 1 tablet (5 mg total) by mouth daily. Overdue for yearly physical w/labs must see MF for refills   Fluorouracil (TOLAK) 4 % CREA, Apply 1 application. topically daily. Qhs x 28 days   imiquimod (ALDARA) 5 % cream, Apply to thigh 3 nights a wk for 8 wks   Omega-3 Fatty Acids (FISH OIL) 1000 MG CAPS, Take 1 capsule by mouth daily.   omeprazole (PRILOSEC) 20 MG capsule, Take 20 mg by mouth daily.   tamsulosin (FLOMAX) 0.4 MG CAPS capsule, Take 1 capsule (0.4 mg total) by mouth daily.   Reviewed prior external information including notes and imaging from  primary care provider As well as notes that were available from care everywhere and other healthcare systems.  Past medical history, social, surgical and family history all reviewed in electronic medical record.  No pertanent information unless stated regarding to the chief complaint.   Review of Systems:  No headache, visual changes, nausea, vomiting, diarrhea, constipation, dizziness, abdominal pain, skin rash, fevers, chills, night sweats, weight loss, swollen lymph nodes, body aches, joint swelling, chest pain, shortness of breath, mood changes. POSITIVE muscle aches  Objective  Blood pressure 118/72, pulse 65, height 6' (1.829 m), weight 255 lb (115.7 kg), SpO2 95%.   General: No apparent distress alert and oriented x3 mood and affect normal, dressed  appropriately.  HEENT: Pupils equal, extraocular movements intact  Respiratory: Patient's speak in full sentences and does not appear short of breath  Cardiovascular: No lower extremity edema, non tender, no erythema  Bilateral knee exam shows patient does have some crepitus noted.  Some instability with valgus and varus force noted.  After informed written and verbal consent, patient was seated on exam table. Right knee was prepped with alcohol swab and utilizing anterolateral approach, patient's right knee space was injected with 4:1  marcaine 0.5%: Kenalog 40mg /dL. Patient tolerated the procedure well without immediate complications.  After informed written and verbal consent, patient was seated on exam table. Left knee was prepped with alcohol swab and utilizing anterolateral approach, patient's left knee space was injected with 4:1  marcaine 0.5%: Kenalog 40mg /dL. Patient tolerated the procedure well without immediate complications.    Impression and Recommendations:     The above documentation has been reviewed and is accurate and complete Judi Saa, DO

## 2023-07-29 ENCOUNTER — Encounter: Payer: Self-pay | Admitting: Family Medicine

## 2023-07-29 ENCOUNTER — Ambulatory Visit: Payer: Medicare Other | Admitting: Family Medicine

## 2023-07-29 VITALS — BP 118/72 | HR 65 | Ht 72.0 in | Wt 255.0 lb

## 2023-07-29 DIAGNOSIS — M170 Bilateral primary osteoarthritis of knee: Secondary | ICD-10-CM | POA: Diagnosis not present

## 2023-07-29 NOTE — Patient Instructions (Signed)
See me after December 1st for gel  injections

## 2023-07-29 NOTE — Assessment & Plan Note (Signed)
Chronic, with worsening symptoms.  Has been approximately 4 months since we have done the injection.  Consider the viscosupplementation again and is due to have it done anytime at the beginning of December if he would like.  Discussed avoiding certain activities, home exercises, continue to work on weight loss.  Follow-up again in 6 to 8 weeks otherwise.

## 2023-09-09 DIAGNOSIS — Z23 Encounter for immunization: Secondary | ICD-10-CM | POA: Diagnosis not present

## 2023-09-29 NOTE — Progress Notes (Unsigned)
Jeffrey [name] [address] [place] [address] Phone: [phone] Subjective:   [name], am serving as a scribe for Dr. Antoine Primas.  I'm seeing this patient by the request  of:  Plotnikov, Georgina Quint, MD  CC: Knee pain follow-up  [id number]  07/29/2023 Chronic, with worsening symptoms.  Has been approximately 4 months since we have done the injection.  Consider the viscosupplementation again and is due to have it done anytime at the beginning of December if he would like.  Discussed avoiding certain activities, home exercises, continue to work on weight loss.  Follow-up again in 6 to 8 weeks otherwise.     Update 09/30/2023 Jeffrey [name] is a 72 y.o. male coming in with complaint of B knee pain. Patient states continue to have pain at the moment.  Continues to have some discomfort on a daily basis.  Sometimes feels like they are unstable.      Past Medical History:  Diagnosis Date   Anxiety    Arthritis    knees   Blood transfusion without reported diagnosis    BPH (benign prostatic hypertrophy)    Cataract    "beginnings of"   Chronic kidney disease    kidney infection   Depression    GERD (gastroesophageal reflux disease)    Hypertension    Meralgia paresthetica of right side    SCCA (squamous cell carcinoma) of skin 11/05/2020   Left Thigh-Anterior (in situ) (curet and 5FU)   Past Surgical History:  Procedure Laterality Date   HERNIA REPAIR     inguinal, umbilical   SPLENECTOMY     Social History   Socioeconomic History   Marital status: Married    Spouse name: Not on file   Number of children: 2   Years of education: 12   Highest education level: 12th grade  Occupational History   Not on file  Tobacco Use   Smoking status: Never   Smokeless tobacco: Never  Vaping Use   Vaping status: Never Used  Substance and Sexual Activity   Alcohol use: Yes    Alcohol/week: 0.0 standard drinks of alcohol    Comment:  occasional wine   Drug use: No   Sexual activity: Yes  Other Topics Concern   Not on file  Social History Narrative   Regular exercise-Yes (goes to the gym 3 times per week for 60 minutes)   Social Determinants of Health   Financial Resource Strain: Low Risk  (02/18/2023)   Overall Financial Resource Strain (CARDIA)    Difficulty of Paying Living Expenses: Not hard at all  Food Insecurity: No Food Insecurity (02/18/2023)   Hunger Vital Sign    Worried About Running Out of Food in the Last Year: Never true    Ran Out of Food in the Last Year: Never true  Transportation Needs: No Transportation Needs (02/18/2023)   PRAPARE - Administrator, Civil Service (Medical): No    Lack of Transportation (Non-Medical): No  Physical Activity: Sufficiently Active (02/18/2023)   Exercise Vital Sign    Days of Exercise per Week: 3 days    Minutes of Exercise per Session: 60 min  Stress: No Stress Concern Present (02/18/2023)   [employer] of Occupational Health - Occupational Stress Questionnaire    Feeling of Stress : Not at all  Social Connections: Socially Integrated (02/18/2023)   Social Connection and Isolation Panel [NHANES]    Frequency of Communication with Friends and  Family: More than three times a week    Frequency of Social Gatherings with Friends and Family: More than three times a week    Attends Religious Services: More than 4 times per year    Active Member of [employer] or Organizations: Yes    Attends Engineer, structural: More than 4 times per year    Marital Status: Married   Allergies  Allergen Reactions   Bupropion Hcl     Unsure of reaction   Lovastatin     REACTION: myalgia   Statins     weak   Venlafaxine     Unsure of reaction   Family History  Problem Relation Age of Onset   Heart disease Mother 80       chf   Hypertension Other    Colon cancer Neg Hx    Esophageal cancer Neg Hx    Stomach cancer Neg Hx    Rectal cancer Neg Hx       Current Outpatient Medications (Cardiovascular):    rosuvastatin (CRESTOR) 5 MG tablet, TAKE 1 TABLET(5 MG) BY MOUTH DAILY.   telmisartan (MICARDIS) 80 MG tablet, TAKE 1 TABLET 80MG  BY MOUTH DAILY   triamterene-hydrochlorothiazide (MAXZIDE-25) 37.5-25 MG tablet, Take 1 tablet by mouth daily.   Current Outpatient Medications (Analgesics):    aspirin 81 MG tablet, Take 81 mg by mouth daily.   celecoxib (CELEBREX) 100 MG capsule, TAKE 1 CAPSULE(100 MG) BY MOUTH DAILY   Current Outpatient Medications (Other):    B Complex-Folic Acid (B COMPLEX PLUS) TABS, Take 1 tablet by mouth daily.   Cholecalciferol (VITAMIN D3) 2000 units TABS, Take 1 tablet by mouth.   finasteride (PROSCAR) 5 MG tablet, Take 1 tablet (5 mg total) by mouth daily. Overdue for yearly physical w/labs must see MF for refills   Fluorouracil (TOLAK) 4 % CREA, Apply 1 application. topically daily. Qhs x 28 days   imiquimod (ALDARA) 5 % cream, Apply to thigh 3 nights a wk for 8 wks   Omega-3 Fatty Acids (FISH OIL) 1000 MG CAPS, Take 1 capsule by mouth daily.   omeprazole (PRILOSEC) 20 MG capsule, Take 20 mg by mouth daily.   tamsulosin (FLOMAX) 0.4 MG CAPS capsule, Take 1 capsule (0.4 mg total) by mouth daily.   Reviewed prior external information including notes and imaging from  primary care provider As well as notes that were available from care everywhere and other healthcare systems.  Past medical history, social, surgical and family history all reviewed in electronic medical record.  No pertanent information unless stated regarding to the chief complaint.   Review of Systems:  No headache, visual changes, nausea, vomiting, diarrhea, constipation, dizziness, abdominal pain, skin rash, fevers, chills, night sweats, weight loss, swollen lymph nodes, body aches, joint swelling, chest pain, shortness of breath, mood changes. POSITIVE muscle aches  Objective  Pulse 71, height 6' (1.829 m), weight 250 lb (113.4 kg),  SpO2 95%.   General: No apparent distress alert and oriented x3 mood and affect normal, dressed appropriately.  HEENT: Pupils equal, extraocular movements intact  Respiratory: Patient's speak in full sentences and does not appear short of breath  Cardiovascular: No lower extremity edema, non tender, no erythema   Low back does have some loss lordosis noted.  Patient does have an antalgic gait noted.  Varus deformity of the knees.  No instability of the knee with valgus and varus force.  After informed written and verbal consent, patient was seated on exam table. Right  knee was prepped with alcohol swab and utilizing anterolateral approach, patient's right knee space was injected with 48 mg per 3 mL of Monovisc (sodium hyaluronate) in a prefilled syringe was injected easily into the knee through a 22-gauge needle..Patient tolerated the procedure well without immediate complications.  After informed written and verbal consent, patient was seated on exam table. Left knee was prepped with alcohol swab and utilizing anterolateral approach, patient's left knee space was injected with 48 mg per 3 mL of Monovisc (sodium hyaluronate) in a prefilled syringe was injected easily into the knee through a 22-gauge needle..Patient tolerated the procedure well without immediate complications.   Impression and Recommendations:    The above documentation has been reviewed and is accurate and complete Judi Saa, DO

## 2023-09-30 ENCOUNTER — Encounter: Payer: Self-pay | Admitting: Family Medicine

## 2023-09-30 ENCOUNTER — Ambulatory Visit: Payer: Medicare Other | Admitting: Family Medicine

## 2023-09-30 VITALS — HR 71 | Ht 72.0 in | Wt 250.0 lb

## 2023-09-30 DIAGNOSIS — M170 Bilateral primary osteoarthritis of knee: Secondary | ICD-10-CM | POA: Diagnosis not present

## 2023-09-30 MED ORDER — HYALURONAN 88 MG/4ML IX SOSY
176.0000 mg | PREFILLED_SYRINGE | Freq: Once | INTRA_ARTICULAR | Status: AC
Start: 2023-09-30 — End: 2023-09-30
  Administered 2023-09-30: 176 mg via INTRA_ARTICULAR

## 2023-09-30 NOTE — Patient Instructions (Addendum)
Monovisc for both knees today See me again in 12 weeks

## 2023-11-02 ENCOUNTER — Other Ambulatory Visit: Payer: Self-pay | Admitting: Internal Medicine

## 2024-01-11 ENCOUNTER — Other Ambulatory Visit: Payer: Self-pay | Admitting: Internal Medicine

## 2024-02-15 NOTE — Progress Notes (Unsigned)
   Joanna Muck, PhD, LAT, ATC acting as a scribe for Garlan Juniper, MD.  Jeffrey [name] is a 74 y.o. male who presents to [facility] today for exacerbation of his R knee pain. Pt was last seen by Dr. Felipe Horton on 09/30/23 and his both knees were injected w/ Monovisc.   Today, pt reports ***  Dx imaging: 02/25/21 Bilat knee standing XR  Pertinent review of systems: ***  Relevant historical information: ***   Exam:  There were no vitals taken for this visit. General: Well Developed, well nourished, and in no acute distress.   MSK: ***    Lab and Radiology Results No results found for this or any previous visit (from the past 72 hours). No results found.     Assessment and Plan: 73 y.o. male with ***   PDMP not reviewed this encounter. No orders of the defined types were placed in this encounter.  No orders of the defined types were placed in this encounter.    Discussed warning signs or symptoms. Please see discharge instructions. Patient expresses understanding.   ***

## 2024-02-16 ENCOUNTER — Other Ambulatory Visit: Payer: Self-pay

## 2024-02-16 ENCOUNTER — Encounter: Payer: Self-pay | Admitting: Family Medicine

## 2024-02-16 ENCOUNTER — Telehealth: Payer: Self-pay | Admitting: Family Medicine

## 2024-02-16 ENCOUNTER — Ambulatory Visit: Admitting: Family Medicine

## 2024-02-16 VITALS — BP 122/80 | HR 66 | Ht 72.0 in | Wt 252.0 lb

## 2024-02-16 DIAGNOSIS — M25561 Pain in right knee: Secondary | ICD-10-CM | POA: Diagnosis not present

## 2024-02-16 DIAGNOSIS — G8929 Other chronic pain: Secondary | ICD-10-CM | POA: Diagnosis not present

## 2024-02-16 DIAGNOSIS — M1711 Unilateral primary osteoarthritis, right knee: Secondary | ICD-10-CM | POA: Diagnosis not present

## 2024-02-16 NOTE — Patient Instructions (Addendum)
 Thank you for coming in today.   You received an injection today. Seek immediate medical attention if the joint becomes red, extremely painful, or is oozing fluid.   See you back as needed.

## 2024-02-16 NOTE — Telephone Encounter (Signed)
 Ran benefits for patient waiting for response

## 2024-02-16 NOTE — Telephone Encounter (Signed)
 Patient is scheduled on June 5th for repeat bilateral Monovisc injections.  Can you confirm authorization, please?

## 2024-02-18 ENCOUNTER — Telehealth: Payer: Self-pay

## 2024-02-18 NOTE — Telephone Encounter (Signed)
 Monovisc authorized for bilateral knee Deductible applies and has met $0 of the $257 Once the deductible has been met patient responsible for coinsurance Coinsurance is 20% PA is not required  Secondary follows medicare guidelines  Will pick up the remaining eligible expenses at 100% It does cover medicare Part B deductible  Reference # Availity Case ID [id number] 08/17/24

## 2024-02-27 ENCOUNTER — Other Ambulatory Visit: Payer: Self-pay | Admitting: Internal Medicine

## 2024-03-22 NOTE — Progress Notes (Deleted)
 Jeffrey [name] [address] [place] [address] Phone: [phone] Subjective:    I'm seeing this patient by the request  of:  Plotnikov, Oakley Bellman, MD  CC:   [name]  Jeffrey [name] is a 73 y.o. male coming in with complaint of B knee pain. Last seen December 2024 for monovisc injections. Patient states here for Monovisc again today.        Past Medical History:  Diagnosis Date   Anxiety    Arthritis    knees   Blood transfusion without reported diagnosis    BPH (benign prostatic hypertrophy)    Cataract    "beginnings of"   Chronic kidney disease    kidney infection   Depression    GERD (gastroesophageal reflux disease)    Hypertension    Meralgia paresthetica of right side    SCCA (squamous cell carcinoma) of skin 11/05/2020   Left Thigh-Anterior (in situ) (curet and 5FU)   Past Surgical History:  Procedure Laterality Date   HERNIA REPAIR     inguinal, umbilical   SPLENECTOMY     Social History   Socioeconomic History   Marital status: Married    Spouse name: Not on file   Number of children: 2   Years of education: 12   Highest education level: 12th grade  Occupational History   Not on file  Tobacco Use   Smoking status: Never   Smokeless tobacco: Never  Vaping Use   Vaping status: Never Used  Substance and Sexual Activity   Alcohol use: Yes    Alcohol/week: 0.0 standard drinks of alcohol    Comment: occasional wine   Drug use: No   Sexual activity: Yes  Other Topics Concern   Not on file  Social History Narrative   Regular exercise-Yes (goes to the gym 3 times per week for 60 minutes)   Social Drivers of Health   Financial Resource Strain: Low Risk  (02/18/2023)   Overall Financial Resource Strain (CARDIA)    Difficulty of Paying Living Expenses: Not hard at all  Food Insecurity: No Food Insecurity (02/18/2023)   Hunger Vital Sign    Worried About Running Out of Food in the Last Year: Never true     Ran Out of Food in the Last Year: Never true  Transportation Needs: No Transportation Needs (02/18/2023)   PRAPARE - Administrator, Civil Service (Medical): No    Lack of Transportation (Non-Medical): No  Physical Activity: Sufficiently Active (02/18/2023)   Exercise Vital Sign    Days of Exercise per Week: 3 days    Minutes of Exercise per Session: 60 min  Stress: No Stress Concern Present (02/18/2023)   [employer] of Occupational Health - Occupational Stress Questionnaire    Feeling of Stress : Not at all  Social Connections: Socially Integrated (02/18/2023)   Social Connection and Isolation Panel [NHANES]    Frequency of Communication with Friends and Family: More than three times a week    Frequency of Social Gatherings with Friends and Family: More than three times a week    Attends Religious Services: More than 4 times per year    Active Member of [employer] or Organizations: Yes    Attends Engineer, structural: More than 4 times per year    Marital Status: Married   Allergies  Allergen Reactions   Bupropion Hcl     Unsure of reaction   Lovastatin     REACTION:  myalgia   Statins     weak   Venlafaxine     Unsure of reaction   Family History  Problem Relation Age of Onset   Heart disease Mother 57       chf   Hypertension Other    Colon cancer Neg Hx    Esophageal cancer Neg Hx    Stomach cancer Neg Hx    Rectal cancer Neg Hx      Current Outpatient Medications (Cardiovascular):    rosuvastatin  (CRESTOR ) 5 MG tablet, TAKE 1 TABLET(5 MG) BY MOUTH DAILY.   telmisartan  (MICARDIS ) 80 MG tablet, TAKE 1 TABLET(80 MG) BY MOUTH DAILY   triamterene -hydrochlorothiazide  (MAXZIDE-25) 37.5-25 MG tablet, Take 1 tablet by mouth daily.   Current Outpatient Medications (Analgesics):    aspirin 81 MG tablet, Take 81 mg by mouth daily.   celecoxib  (CELEBREX ) 100 MG capsule, TAKE 1 CAPSULE(100 MG) BY MOUTH DAILY   Current Outpatient Medications (Other):     B Complex-Folic Acid (B COMPLEX PLUS) TABS, Take 1 tablet by mouth daily.   Cholecalciferol (VITAMIN D3) 2000 units TABS, Take 1 tablet by mouth.   finasteride  (PROSCAR ) 5 MG tablet, Take 1 tablet (5 mg total) by mouth daily. Overdue for yearly physical w/labs must see MF for refills   Fluorouracil  (TOLAK ) 4 % CREA, Apply 1 application. topically daily. Qhs x 28 days   imiquimod  (ALDARA ) 5 % cream, Apply to thigh 3 nights a wk for 8 wks   Omega-3 Fatty Acids (FISH OIL) 1000 MG CAPS, Take 1 capsule by mouth daily.   omeprazole  (PRILOSEC) 20 MG capsule, Take 20 mg by mouth daily.   tamsulosin  (FLOMAX ) 0.4 MG CAPS capsule, TAKE 1 CAPSULE(0.4 MG) BY MOUTH DAILY   Reviewed prior external information including notes and imaging from  primary care provider As well as notes that were available from care everywhere and other healthcare systems.  Past medical history, social, surgical and family history all reviewed in electronic medical record.  No pertanent information unless stated regarding to the chief complaint.   Review of Systems:  No headache, visual changes, nausea, vomiting, diarrhea, constipation, dizziness, abdominal pain, skin rash, fevers, chills, night sweats, weight loss, swollen lymph nodes, body aches, joint swelling, chest pain, shortness of breath, mood changes. POSITIVE muscle aches  Objective  There were no vitals taken for this visit.   General: No apparent distress alert and oriented x3 mood and affect normal, dressed appropriately.  HEENT: Pupils equal, extraocular movements intact  Respiratory: Patient's speak in full sentences and does not appear short of breath  Cardiovascular: No lower extremity edema, non tender, no erythema      Impression and Recommendations:

## 2024-03-28 NOTE — Progress Notes (Unsigned)
 [name] [address] [place] [address] Phone: [phone] Subjective:   Jeffrey [name] am a scribe for Dr. Felipe Horton.   I'm seeing this patient by the request  of:  Plotnikov, Oakley Bellman, MD  CC: Bilateral knee pain  ION:[id number]  09/30/2023 Monovisc both knees  Update 03/30/2024 Jeffrey [name] is a 73 y.o. male coming in with complaint of B knee pain. Monovisc authorized for B knees. Patient states the right one is really bothering him this week because he put too much weight on his leg this week. Been icing it and has a brace on today.  [name] [address] [place] [address] Phone: [phone] Subjective:    I'm seeing this patient by the request  of:  Plotnikov, Oakley Bellman, MD  CC:   [id number]  Jeffrey [name] is a 73 y.o. male coming in with complaint of B knee pain. Last seen December 2024 for monovisc injections. Patient states here for Monovisc again today. Right knee giving him more problems due to him putting more weight on it this week. Been icing it and currently wearing a brace.        Past Medical History:  Diagnosis Date   Anxiety    Arthritis    knees   Blood transfusion without reported diagnosis    BPH (benign prostatic hypertrophy)    Cataract    "beginnings of"   Chronic kidney disease    kidney infection   Depression    GERD (gastroesophageal reflux disease)    Hypertension    Meralgia paresthetica of right side    SCCA (squamous cell carcinoma) of skin 11/05/2020   Left Thigh-Anterior (in situ) (curet and 5FU)   Past Surgical History:  Procedure Laterality Date   HERNIA REPAIR     inguinal, umbilical   SPLENECTOMY     Social History   Socioeconomic History   Marital status: Married    Spouse name: Not on file   Number of children: 2   Years of education: 12   Highest education level: 12th grade  Occupational History   Not on file  Tobacco Use    Smoking status: Never   Smokeless tobacco: Never  Vaping Use   Vaping status: Never Used  Substance and Sexual Activity   Alcohol use: Yes    Alcohol/week: 0.0 standard drinks of alcohol    Comment: occasional wine   Drug use: No   Sexual activity: Yes  Other Topics Concern   Not on file  Social History Narrative   Regular exercise-Yes (goes to the gym 3 times per week for 60 minutes)   Social Drivers of Health   Financial Resource Strain: Low Risk  (02/18/2023)   Overall Financial Resource Strain (CARDIA)    Difficulty of Paying Living Expenses: Not hard at all  Food Insecurity: No Food Insecurity (02/18/2023)   Hunger Vital Sign    Worried About Running Out of Food in the Last Year: Never true    Ran Out of Food in the Last Year: Never true  Transportation Needs: No Transportation Needs (02/18/2023)   PRAPARE - Administrator, Civil Service (Medical): No    Lack of Transportation (Non-Medical): No  Physical Activity: Sufficiently Active (02/18/2023)   Exercise Vital Sign    Days of Exercise per Week: 3 days    Minutes of Exercise per Session: 60 min  Stress: No Stress Concern Present (02/18/2023)  [employer] of Occupational Health - Occupational Stress Questionnaire    Feeling of Stress : Not at all  Social Connections: Socially Integrated (02/18/2023)   Social Connection and Isolation Panel [NHANES]    Frequency of Communication with Friends and Family: More than three times a week    Frequency of Social Gatherings with Friends and Family: More than three times a week    Attends Religious Services: More than 4 times per year    Active Member of [employer] or Organizations: Yes    Attends Engineer, structural: More than 4 times per year    Marital Status: Married   Allergies  Allergen Reactions   Bupropion Hcl     Unsure of reaction   Lovastatin     REACTION: myalgia   Statins     weak   Venlafaxine     Unsure of reaction   Family History   Problem Relation Age of Onset   Heart disease Mother 49       chf   Hypertension Other    Colon cancer Neg Hx    Esophageal cancer Neg Hx    Stomach cancer Neg Hx    Rectal cancer Neg Hx      Current Outpatient Medications (Cardiovascular):    rosuvastatin  (CRESTOR ) 5 MG tablet, TAKE 1 TABLET(5 MG) BY MOUTH DAILY.   telmisartan  (MICARDIS ) 80 MG tablet, TAKE 1 TABLET(80 MG) BY MOUTH DAILY   triamterene -hydrochlorothiazide  (MAXZIDE-25) 37.5-25 MG tablet, Take 1 tablet by mouth daily.   Current Outpatient Medications (Analgesics):    aspirin 81 MG tablet, Take 81 mg by mouth daily.   celecoxib  (CELEBREX ) 100 MG capsule, TAKE 1 CAPSULE(100 MG) BY MOUTH DAILY   Current Outpatient Medications (Other):    B Complex-Folic Acid (B COMPLEX PLUS) TABS, Take 1 tablet by mouth daily.   Cholecalciferol (VITAMIN D3) 2000 units TABS, Take 1 tablet by mouth.   finasteride  (PROSCAR ) 5 MG tablet, Take 1 tablet (5 mg total) by mouth daily. Overdue for yearly physical w/labs must see MF for refills   Fluorouracil  (TOLAK ) 4 % CREA, Apply 1 application. topically daily. Qhs x 28 days   imiquimod  (ALDARA ) 5 % cream, Apply to thigh 3 nights a wk for 8 wks   Omega-3 Fatty Acids (FISH OIL) 1000 MG CAPS, Take 1 capsule by mouth daily.   omeprazole  (PRILOSEC) 20 MG capsule, Take 20 mg by mouth daily.   tamsulosin  (FLOMAX ) 0.4 MG CAPS capsule, TAKE 1 CAPSULE(0.4 MG) BY MOUTH DAILY   Reviewed prior external information including notes and imaging from  primary care provider As well as notes that were available from care everywhere and other healthcare systems.  Past medical history, social, surgical and family history all reviewed in electronic medical record.  No pertanent information unless stated regarding to the chief complaint.   Review of Systems:  No headache, visual changes, nausea, vomiting, diarrhea, constipation, dizziness, abdominal pain, skin rash, fevers, chills, night sweats, weight loss,  swollen lymph nodes, body aches, joint swelling, chest pain, shortness of breath, mood changes. POSITIVE muscle aches  Objective  Blood pressure 122/60, pulse 65, height 6' (1.829 m), weight 246 lb 12.8 oz (111.9 kg), SpO2 98%.   General: No apparent distress alert and oriented x3 mood and affect normal, dressed appropriately.  HEENT: Pupils equal, extraocular movements intact  Respiratory: Patient's speak in full sentences and does not appear short of breath  Cardiovascular: No lower extremity edema, non tender, no erythema  Impression and Recommendations:             Past Medical History:  Diagnosis Date   Anxiety    Arthritis    knees   Blood transfusion without reported diagnosis    BPH (benign prostatic hypertrophy)    Cataract    "beginnings of"   Chronic kidney disease    kidney infection   Depression    GERD (gastroesophageal reflux disease)    Hypertension    Meralgia paresthetica of right side    SCCA (squamous cell carcinoma) of skin 11/05/2020   Left Thigh-Anterior (in situ) (curet and 5FU)   Past Surgical History:  Procedure Laterality Date   HERNIA REPAIR     inguinal, umbilical   SPLENECTOMY     Social History   Socioeconomic History   Marital status: Married    Spouse name: Not on file   Number of children: 2   Years of education: 12   Highest education level: 12th grade  Occupational History   Not on file  Tobacco Use   Smoking status: Never   Smokeless tobacco: Never  Vaping Use   Vaping status: Never Used  Substance and Sexual Activity   Alcohol use: Yes    Alcohol/week: 0.0 standard drinks of alcohol    Comment: occasional wine   Drug use: No   Sexual activity: Yes  Other Topics Concern   Not on file  Social History Narrative   Regular exercise-Yes (goes to the gym 3 times per week for 60 minutes)   Social Drivers of Health   Financial Resource Strain: Low Risk  (02/18/2023)   Overall Financial Resource Strain (CARDIA)     Difficulty of Paying Living Expenses: Not hard at all  Food Insecurity: No Food Insecurity (02/18/2023)   Hunger Vital Sign    Worried About Running Out of Food in the Last Year: Never true    Ran Out of Food in the Last Year: Never true  Transportation Needs: No Transportation Needs (02/18/2023)   PRAPARE - Administrator, Civil Service (Medical): No    Lack of Transportation (Non-Medical): No  Physical Activity: Sufficiently Active (02/18/2023)   Exercise Vital Sign    Days of Exercise per Week: 3 days    Minutes of Exercise per Session: 60 min  Stress: No Stress Concern Present (02/18/2023)   [employer] of Occupational Health - Occupational Stress Questionnaire    Feeling of Stress : Not at all  Social Connections: Socially Integrated (02/18/2023)   Social Connection and Isolation Panel [NHANES]    Frequency of Communication with Friends and Family: More than three times a week    Frequency of Social Gatherings with Friends and Family: More than three times a week    Attends Religious Services: More than 4 times per year    Active Member of [employer] or Organizations: Yes    Attends Engineer, structural: More than 4 times per year    Marital Status: Married   Allergies  Allergen Reactions   Bupropion Hcl     Unsure of reaction   Lovastatin     REACTION: myalgia   Statins     weak   Venlafaxine     Unsure of reaction   Family History  Problem Relation Age of Onset   Heart disease Mother 84       chf   Hypertension Other    Colon cancer Neg Hx    Esophageal cancer Neg Hx  Stomach cancer Neg Hx    Rectal cancer Neg Hx      Current Outpatient Medications (Cardiovascular):    rosuvastatin  (CRESTOR ) 5 MG tablet, TAKE 1 TABLET(5 MG) BY MOUTH DAILY.   telmisartan  (MICARDIS ) 80 MG tablet, TAKE 1 TABLET(80 MG) BY MOUTH DAILY   triamterene -hydrochlorothiazide  (MAXZIDE-25) 37.5-25 MG tablet, Take 1 tablet by mouth daily.   Current Outpatient  Medications (Analgesics):    aspirin 81 MG tablet, Take 81 mg by mouth daily.   celecoxib  (CELEBREX ) 100 MG capsule, TAKE 1 CAPSULE(100 MG) BY MOUTH DAILY   Current Outpatient Medications (Other):    B Complex-Folic Acid (B COMPLEX PLUS) TABS, Take 1 tablet by mouth daily.   Cholecalciferol (VITAMIN D3) 2000 units TABS, Take 1 tablet by mouth.   finasteride  (PROSCAR ) 5 MG tablet, Take 1 tablet (5 mg total) by mouth daily. Overdue for yearly physical w/labs must see MF for refills   Fluorouracil  (TOLAK ) 4 % CREA, Apply 1 application. topically daily. Qhs x 28 days   imiquimod  (ALDARA ) 5 % cream, Apply to thigh 3 nights a wk for 8 wks   Omega-3 Fatty Acids (FISH OIL) 1000 MG CAPS, Take 1 capsule by mouth daily.   omeprazole  (PRILOSEC) 20 MG capsule, Take 20 mg by mouth daily.   tamsulosin  (FLOMAX ) 0.4 MG CAPS capsule, TAKE 1 CAPSULE(0.4 MG) BY MOUTH DAILY   Reviewed prior external information including notes and imaging from  primary care provider As well as notes that were available from care everywhere and other healthcare systems.  Past medical history, social, surgical and family history all reviewed in electronic medical record.  No pertanent information unless stated regarding to the chief complaint.   Review of Systems:  No headache, visual changes, nausea, vomiting, diarrhea, constipation, dizziness, abdominal pain, skin rash, fevers, chills, night sweats, weight loss, swollen lymph nodes, body aches, joint swelling, chest pain, shortness of breath, mood changes. POSITIVE muscle aches  Objective  Blood pressure 122/60, pulse 65, height 6' (1.829 m), weight 246 lb 12.8 oz (111.9 kg), SpO2 98%.   General: No apparent distress alert and oriented x3 mood and affect normal, dressed appropriately.  HEENT: Pupils equal, extraocular movements intact  Respiratory: Patient's speak in full sentences and does not appear short of breath  Cardiovascular: No lower extremity edema, non tender,  no erythema   Bilateral knee exam shows arthritic changes noted bilaterally.  Trace effusion noted bilaterally.  Crepitus noted bilaterally.  After informed written and verbal consent, patient was seated on exam table. Right knee was prepped with alcohol swab and utilizing anterolateral approach, patient's right knee space was injected with 48 mg per 3 mL of Monovisc (sodium hyaluronate) in a prefilled syringe was injected easily into the knee through a 22-gauge needle..Patient tolerated the procedure well without immediate complications.  After informed written and verbal consent, patient was seated on exam table. Left knee was prepped with alcohol swab and utilizing anterolateral approach, patient's left knee space was injected with 40 mg per 3 mL of Monovisc (sodium hyaluronate) in a prefilled syringe was injected easily into the knee through a 22-gauge needle..Patient tolerated the procedure well without immediate complications.     Impression and Recommendations:     The above documentation has been reviewed and is accurate and complete Meda Dudzinski M Jeniffer Culliver, DO

## 2024-03-30 ENCOUNTER — Ambulatory Visit: Admitting: Family Medicine

## 2024-03-30 ENCOUNTER — Encounter: Payer: Self-pay | Admitting: Family Medicine

## 2024-03-30 VITALS — BP 122/60 | HR 65 | Ht 72.0 in | Wt 246.8 lb

## 2024-03-30 DIAGNOSIS — M170 Bilateral primary osteoarthritis of knee: Secondary | ICD-10-CM | POA: Diagnosis not present

## 2024-03-30 MED ORDER — HYALURONAN 88 MG/4ML IX SOSY
88.0000 mg | PREFILLED_SYRINGE | Freq: Once | INTRA_ARTICULAR | Status: AC
  Administered 2024-03-30: 88 mg via INTRA_ARTICULAR

## 2024-03-30 NOTE — Patient Instructions (Addendum)
 Good to see you. Monovisc injections today.  See me again in

## 2024-03-30 NOTE — Assessment & Plan Note (Signed)
 Chronic, with exacerbation.  Repeat viscosupplementation given again today.  Has responded fairly well to this.  Does have fairly significant varus deformity and also noted.  Discussed avoiding certain activities.  Increase activities slowly.  Follow-up again in 6 to 8 weeks otherwise.

## 2024-06-16 ENCOUNTER — Ambulatory Visit

## 2024-06-16 VITALS — BP 118/72 | HR 66 | Ht 69.5 in | Wt 249.0 lb

## 2024-06-16 DIAGNOSIS — Z0000 Encounter for general adult medical examination without abnormal findings: Secondary | ICD-10-CM

## 2024-06-16 NOTE — Progress Notes (Signed)
 Subjective:   Jeffrey [name] is a 73 y.o. who presents for a Medicare Wellness preventive visit.  As a reminder, Annual Wellness Visits don't include a physical exam, and some assessments may be limited, especially if this visit is performed virtually. We may recommend an in-person follow-up visit with your provider if needed.  Visit Complete: In person  Persons Participating in Visit: Patient.  AWV Questionnaire: No: Patient Medicare AWV questionnaire was not completed prior to this visit.  Cardiac Risk Factors include: advanced age (>75men, >72 women);hypertension;Other (see comment);dyslipidemia, Risk factor comments: BPH     Objective:    Today's Vitals   06/16/24 1125 06/16/24 1131  Weight:  249 lb (112.9 kg)  Height:  5' 9.5 (1.765 m)  PainSc: 8     Body mass index is 36.24 kg/m.     06/16/2024   11:38 AM 02/18/2023    1:38 PM 01/13/2023    1:01 PM 12/31/2021    3:17 PM 12/30/2020    1:47 PM 04/14/2018    2:31 PM 02/15/2018    1:06 PM  Advanced Directives  Does Patient Have a Medical Advance Directive? Yes Yes Yes Yes Yes Yes  Yes   Type of Estate agent of [place];Living will Healthcare Power of [place];Living will Healthcare Power of [place];Living will Living will;Healthcare Power of [employer] Power of [place];Living will Healthcare Power of [place];Living will   Does patient want to make changes to medical advance directive?   No - Patient declined No - Patient declined No - Patient declined  No - Patient declined   Copy of Healthcare Power of Attorney in Chart? No - copy requested No - copy requested No - copy requested No - copy requested No - copy requested No - copy requested       Data saved with a previous flowsheet row definition    Current Medications (verified) Outpatient Encounter Medications as of 06/16/2024  Medication Sig   aspirin 81 MG tablet Take 81 mg by mouth daily.   B Complex-Folic Acid (B COMPLEX PLUS) TABS  Take 1 tablet by mouth daily.   celecoxib  (CELEBREX ) 100 MG capsule TAKE 1 CAPSULE(100 MG) BY MOUTH DAILY   Cholecalciferol (VITAMIN D3) 2000 units TABS Take 1 tablet by mouth.   finasteride  (PROSCAR ) 5 MG tablet Take 1 tablet (5 mg total) by mouth daily. Overdue for yearly physical w/labs must see MF for refills   Fluorouracil  (TOLAK ) 4 % CREA Apply 1 application. topically daily. Qhs x 28 days   imiquimod  (ALDARA ) 5 % cream Apply to thigh 3 nights a wk for 8 wks   Omega-3 Fatty Acids (FISH OIL) 1000 MG CAPS Take 1 capsule by mouth daily.   omeprazole  (PRILOSEC) 20 MG capsule Take 20 mg by mouth daily.   rosuvastatin  (CRESTOR ) 5 MG tablet TAKE 1 TABLET(5 MG) BY MOUTH DAILY.   tamsulosin  (FLOMAX ) 0.4 MG CAPS capsule TAKE 1 CAPSULE(0.4 MG) BY MOUTH DAILY   telmisartan  (MICARDIS ) 80 MG tablet TAKE 1 TABLET(80 MG) BY MOUTH DAILY   triamterene -hydrochlorothiazide  (MAXZIDE-25) 37.5-25 MG tablet Take 1 tablet by mouth daily.   No facility-administered encounter medications on file as of 06/16/2024.    Allergies (verified) Bupropion hcl, Lovastatin, Statins, and Venlafaxine   History: Past Medical History:  Diagnosis Date   Anxiety    Arthritis    knees   Blood transfusion without reported diagnosis    BPH (benign prostatic hypertrophy)    Cataract    beginnings of  Chronic kidney disease    kidney infection   Depression    GERD (gastroesophageal reflux disease)    Hypertension    Meralgia paresthetica of right side    SCCA (squamous cell carcinoma) of skin 11/05/2020   Left Thigh-Anterior (in situ) (curet and 5FU)   Past Surgical History:  Procedure Laterality Date   HERNIA REPAIR     inguinal, umbilical   SPLENECTOMY     Family History  Problem Relation Age of Onset   Heart disease Mother 57       chf   Hypertension Other    Colon cancer Neg Hx    Esophageal cancer Neg Hx    Stomach cancer Neg Hx    Rectal cancer Neg Hx    Social History   Socioeconomic History    Marital status: Married    Spouse name: Arland   Number of children: 2   Years of education: 12   Highest education level: 12th grade  Occupational History   Occupation: Still Working  Tobacco Use   Smoking status: Never   Smokeless tobacco: Never  Vaping Use   Vaping status: Never Used  Substance and Sexual Activity   Alcohol use: Yes    Alcohol/week: 0.0 standard drinks of alcohol    Comment: occasional wine   Drug use: No   Sexual activity: Yes  Other Topics Concern   Not on file  Social History Narrative   Regular exercise-Yes (goes to the gym 3 times per week for 60 minutes)      Lives with wife/2025   Social Drivers of Health   Financial Resource Strain: Low Risk  (06/16/2024)   Overall Financial Resource Strain (CARDIA)    Difficulty of Paying Living Expenses: Not hard at all  Food Insecurity: No Food Insecurity (06/16/2024)   Hunger Vital Sign    Worried About Running Out of Food in the Last Year: Never true    Ran Out of Food in the Last Year: Never true  Transportation Needs: No Transportation Needs (06/16/2024)   PRAPARE - Administrator, Civil Service (Medical): No    Lack of Transportation (Non-Medical): No  Physical Activity: Inactive (06/16/2024)   Exercise Vital Sign    Days of Exercise per Week: 0 days    Minutes of Exercise per Session: 0 min  Stress: No Stress Concern Present (06/16/2024)   [employer] of Occupational Health - Occupational Stress Questionnaire    Feeling of Stress: Only a little  Social Connections: Socially Integrated (06/16/2024)   Social Connection and Isolation Panel    Frequency of Communication with Friends and Family: More than three times a week    Frequency of Social Gatherings with Friends and Family: Not on file    Attends Religious Services: More than 4 times per year    Active Member of [employer] or Organizations: Yes    Attends Banker Meetings: Never    Marital Status: Married    Tobacco  Counseling Counseling given: Not Answered    Clinical Intake:  Pre-visit preparation completed: Yes  Pain : 0-10 Pain Score: 8  Pain Type: Chronic pain (woresning in last few days) Pain Location: Hip Pain Orientation: Left Pain Radiating Towards: to his back Pain Descriptors / Indicators: Aching, Discomfort Pain Onset: In the past 7 days Pain Frequency: Constant (when sitting no pain) Pain Relieving Factors: Aleve Pm at night Effect of Pain on Daily Activities: walking  Pain Relieving Factors: Aleve Pm at night  No results found for: HGBA1C         Information entered by :: Feliciana Narayan, RMA   Activities of Daily Living     06/16/2024   11:25 AM  In your present state of health, do you have any difficulty performing the following activities:  Hearing? 1  Comment Has some (30%) hearing loss in lt ear  Vision? 0  Difficulty concentrating or making decisions? 0  Walking or climbing stairs? 0  Dressing or bathing? 0  Doing errands, shopping? 0  Preparing Food and eating ? N  Using the Toilet? N  In the past six months, have you accidently leaked urine? N  Do you have problems with loss of bowel control? N  Managing your Medications? N  Managing your Finances? N  Housekeeping or managing your Housekeeping? N    Patient Care Team: Plotnikov, Karlynn GAILS, MD as PCP - General Matilda Senior, MD as Attending Physician (Urology) Livingston Rigg, MD as Consulting Physician (Dermatology)  I have updated your Care Teams any recent Medical Services you may have received from other providers in the past year.     Assessment:   This is a routine wellness examination for Jeffrey [name].  Hearing/Vision screen Hearing Screening - Comments:: Has some hearing loss in lt ear Vision Screening - Comments:: Wears eyeglasses/ [facility]   Goals Addressed             This Visit's Progress    My goal for 2024 is to lose 40 pounds by watching my diet and continuing  with therapy.   Not on track      Depression Screen     06/16/2024   11:40 AM 02/18/2023    1:39 PM 01/13/2023    9:19 AM 12/31/2021    3:16 PM 12/30/2020    1:45 PM 11/08/2020    8:05 AM 04/14/2018    2:31 PM  PHQ 2/9 Scores  PHQ - 2 Score 0 0 0 0 0 0 0  PHQ- 9 Score 2 0    0 0    Fall Risk     06/16/2024   11:38 AM 02/18/2023    1:39 PM 01/13/2023    9:18 AM 12/31/2021    3:18 PM 12/30/2020    1:48 PM  Fall Risk   Falls in the past year? 0 0 0 0 0  Number falls in past yr: 0 0 0 0 0  Injury with Fall? 0 0 0 0 0  Risk for fall due to :  No Fall Risks No Fall Risks No Fall Risks No Fall Risks  Follow up Falls evaluation completed;Falls prevention discussed Falls prevention discussed Falls evaluation completed Falls evaluation completed  Falls evaluation completed      Data saved with a previous flowsheet row definition    MEDICARE RISK AT HOME:  Medicare Risk at Home Any stairs in or around the home?: Yes If so, are there any without handrails?: No Home free of loose throw rugs in walkways, pet beds, electrical cords, etc?: Yes Adequate lighting in your home to reduce risk of falls?: Yes Life alert?: No Use of a cane, walker or w/c?: No Grab bars in the bathroom?: Yes Shower chair or bench in shower?: Yes Elevated toilet seat or a handicapped toilet?: Yes  TIMED UP AND GO:  Was the test performed?  Yes  Length of time to ambulate 10 feet: 20 sec Gait slow and steady without use of assistive device  Cognitive Function: Declined/Normal:  No cognitive concerns noted by patient or family. Patient alert, oriented, able to answer questions appropriately and recall recent events. No signs of memory loss or confusion.        02/18/2023    1:39 PM  6CIT Screen  What Year? 0 points  What month? 0 points  What time? 0 points  Count back from 20 0 points  Months in reverse 0 points  Repeat phrase 0 points  Total Score 0 points    Immunizations Immunization History   Administered Date(s) Administered   Fluad Quad(high Dose 65+) 07/20/2019, 09/14/2020   Influenza Whole 10/16/2002, 09/05/2012   Influenza, High Dose Seasonal PF 09/08/2017, 10/12/2018   Influenza,inj,Quad PF,6+ Mos 09/04/2016   Influenza,trivalent, recombinat, inj, PF 08/27/2011   Influenza-Unspecified 09/05/2013, 09/16/2015, 09/14/2022   Meningococcal polysaccharide vaccine (MPSV4) 05/23/2010   PFIZER(Purple Top)SARS-COV-2 Vaccination 01/04/2020, 01/25/2020   PNEUMOCOCCAL CONJUGATE-20 01/13/2023   Pneumococcal Conjugate-13 12/06/2015   Pneumococcal Polysaccharide-23 05/23/2010, 04/14/2018   Tdap 12/06/2013   Zoster Recombinant(Shingrix ) 09/21/2022, 11/23/2022    Screening Tests Health Maintenance  Topic Date Due   COVID-19 Vaccine (3 - Pfizer risk series) 02/22/2020   DTaP/Tdap/Td (2 - Td or Tdap) 12/07/2023   INFLUENZA VACCINE  05/26/2024   Medicare Annual Wellness (AWV)  06/16/2025   Colonoscopy  08/26/2025   Pneumococcal Vaccine: 50+ Years  Completed   Hepatitis C Screening  Completed   Zoster Vaccines- Shingrix   Completed   HPV VACCINES  Aged Out   Meningococcal B Vaccine  Aged Out    Health Maintenance  Health Maintenance Due  Topic Date Due   COVID-19 Vaccine (3 - Pfizer risk series) 02/22/2020   DTaP/Tdap/Td (2 - Td or Tdap) 12/07/2023   INFLUENZA VACCINE  05/26/2024   Health Maintenance Items Addressed: See Nurse Notes at the end of this note  Additional Screening:  Vision Screening: Recommended annual ophthalmology exams for early detection of glaucoma and other disorders of the eye. Would you like a referral to an eye doctor? No    Dental Screening: Recommended annual dental exams for proper oral hygiene  Community Resource Referral / Chronic Care Management: CRR required this visit?  No   CCM required this visit?  No   Plan:    I have personally reviewed and noted the following in the patient's chart:   Medical and social history Use of  alcohol, tobacco or illicit drugs  Current medications and supplements including opioid prescriptions. Patient is not currently taking opioid prescriptions. Functional ability and status Nutritional status Physical activity Advanced directives List of other physicians Hospitalizations, surgeries, and ER visits in previous 12 months Vitals Screenings to include cognitive, depression, and falls Referrals and appointments  In addition, I have reviewed and discussed with patient certain preventive protocols, quality metrics, and best practice recommendations. A written personalized care plan for preventive services as well as general preventive health recommendations were provided to patient.   Magin Balbi L Kaegan Hettich, CMA   06/16/2024   After Visit Summary: (In Person-Printed) AVS printed and given to the patient  Notes: Patient is due for a Tetanus vaccine.  Patient is requesting refills today.  Patient is requesting a refill on Celecoxib  100 mg and also asking for Cialis  20 mg PRN as well.

## 2024-06-16 NOTE — Patient Instructions (Addendum)
 [name] , Thank you for taking time out of your busy schedule to complete your Annual Wellness Visit with me. I enjoyed our conversation and look forward to speaking with you again next year. I, as well as your care team,  appreciate your ongoing commitment to your health goals. Please review the following plan we discussed and let me know if I can assist you in the future. Your Game plan/ To Do List    Follow up Visits: We will see or speak with you next year for your Next Medicare AWV with our clinical staff Have you seen your provider in the last 6 months (3 months if uncontrolled diabetes)? No.  Clinician Recommendations:  Aim for 30 minutes of exercise or brisk walking, 6-8 glasses of water, and 5 servings of fruits and vegetables each day. You are due for tetanus and can get that due at the pharmacy.  I have sent a request to provider of the day for your refills.        This is a list of the screenings recommended for you:  Health Maintenance  Topic Date Due   COVID-19 Vaccine (3 - Pfizer risk series) 02/22/2020   DTaP/Tdap/Td vaccine (2 - Td or Tdap) 12/07/2023   Flu Shot  05/26/2024   Medicare Annual Wellness Visit  06/16/2025   Colon Cancer Screening  08/26/2025   Pneumococcal Vaccine for age over 44  Completed   Hepatitis C Screening  Completed   Zoster (Shingles) Vaccine  Completed   HPV Vaccine  Aged Out   Meningitis B Vaccine  Aged Out    Advanced directives: (Copy Requested) Please bring a copy of your health care power of attorney and living will to the office to be added to your chart at your convenience. You can mail to [facility] [address] [place], [place] [address] or email to [email or web address] Advance Care Planning is important because it:  [x]  Makes sure you receive the medical care that is consistent with your values, goals, and preferences  [x]  It provides guidance to your family and loved ones and reduces their decisional  burden about whether or not they are making the right decisions based on your wishes.  Follow the link provided in your after visit summary or read over the paperwork we have mailed to you to help you started getting your Advance Directives in place. If you need assistance in completing these, please reach out to us  so that we can help you!  See attachments for Preventive Care and Fall Prevention Tips.

## 2024-06-21 ENCOUNTER — Encounter: Payer: Self-pay | Admitting: Internal Medicine

## 2024-06-21 ENCOUNTER — Ambulatory Visit (INDEPENDENT_AMBULATORY_CARE_PROVIDER_SITE_OTHER): Admitting: Internal Medicine

## 2024-06-21 VITALS — BP 126/68 | HR 60 | Temp 97.6°F | Ht 69.5 in | Wt 247.6 lb

## 2024-06-21 DIAGNOSIS — Z1211 Encounter for screening for malignant neoplasm of colon: Secondary | ICD-10-CM

## 2024-06-21 DIAGNOSIS — R972 Elevated prostate specific antigen [PSA]: Secondary | ICD-10-CM

## 2024-06-21 DIAGNOSIS — Z0000 Encounter for general adult medical examination without abnormal findings: Secondary | ICD-10-CM

## 2024-06-21 DIAGNOSIS — E785 Hyperlipidemia, unspecified: Secondary | ICD-10-CM | POA: Diagnosis not present

## 2024-06-21 DIAGNOSIS — I2583 Coronary atherosclerosis due to lipid rich plaque: Secondary | ICD-10-CM | POA: Diagnosis not present

## 2024-06-21 DIAGNOSIS — R944 Abnormal results of kidney function studies: Secondary | ICD-10-CM

## 2024-06-21 DIAGNOSIS — I2510 Atherosclerotic heart disease of native coronary artery without angina pectoris: Secondary | ICD-10-CM

## 2024-06-21 DIAGNOSIS — M545 Low back pain, unspecified: Secondary | ICD-10-CM

## 2024-06-21 LAB — CBC WITH DIFFERENTIAL/PLATELET
Basophils Absolute: 0.1 K/uL (ref 0.0–0.1)
Basophils Relative: 0.6 % (ref 0.0–3.0)
Eosinophils Absolute: 0.2 K/uL (ref 0.0–0.7)
Eosinophils Relative: 2.5 % (ref 0.0–5.0)
HCT: 47.8 % (ref 39.0–52.0)
Hemoglobin: 15.8 g/dL (ref 13.0–17.0)
Lymphocytes Relative: 23.2 % (ref 12.0–46.0)
Lymphs Abs: 1.8 K/uL (ref 0.7–4.0)
MCHC: 33 g/dL (ref 30.0–36.0)
MCV: 98.2 fl (ref 78.0–100.0)
Monocytes Absolute: 1 K/uL (ref 0.1–1.0)
Monocytes Relative: 12.8 % — ABNORMAL HIGH (ref 3.0–12.0)
Neutro Abs: 4.8 K/uL (ref 1.4–7.7)
Neutrophils Relative %: 60.9 % (ref 43.0–77.0)
Platelets: 282 K/uL (ref 150.0–400.0)
RBC: 4.87 Mil/uL (ref 4.22–5.81)
RDW: 13.7 % (ref 11.5–15.5)
WBC: 7.8 K/uL (ref 4.0–10.5)

## 2024-06-21 LAB — COMPREHENSIVE METABOLIC PANEL WITH GFR
ALT: 12 U/L (ref 0–53)
AST: 16 U/L (ref 0–37)
Albumin: 3.7 g/dL (ref 3.5–5.2)
Alkaline Phosphatase: 73 U/L (ref 39–117)
BUN: 21 mg/dL (ref 6–23)
CO2: 28 meq/L (ref 19–32)
Calcium: 8.9 mg/dL (ref 8.4–10.5)
Chloride: 101 meq/L (ref 96–112)
Creatinine, Ser: 1.09 mg/dL (ref 0.40–1.50)
GFR: 66.9 mL/min (ref >60.00)
Glucose, Bld: 95 mg/dL (ref 70–99)
Potassium: 4.1 meq/L (ref 3.5–5.1)
Sodium: 139 meq/L (ref 135–145)
Total Bilirubin: 0.9 mg/dL (ref 0.2–1.2)
Total Protein: 6.7 g/dL (ref 6.0–8.3)

## 2024-06-21 LAB — URINALYSIS
Bilirubin Urine: NEGATIVE
Hgb urine dipstick: NEGATIVE
Ketones, ur: NEGATIVE
Leukocytes,Ua: NEGATIVE
Nitrite: NEGATIVE
Specific Gravity, Urine: 1.015 (ref 1.000–1.030)
Total Protein, Urine: NEGATIVE
Urine Glucose: NEGATIVE
Urobilinogen, UA: 0.2 (ref 0.0–1.0)
pH: 7 (ref 5.0–8.0)

## 2024-06-21 LAB — LIPID PANEL
Cholesterol: 168 mg/dL (ref 0–200)
HDL: 44.1 mg/dL (ref >39.00)
LDL Cholesterol: 112 mg/dL — ABNORMAL HIGH (ref 0–99)
NonHDL: 124.29
Total CHOL/HDL Ratio: 4
Triglycerides: 63 mg/dL (ref 0.0–149.0)
VLDL: 12.6 mg/dL (ref 0.0–40.0)

## 2024-06-21 LAB — TSH: TSH: 1.65 u[IU]/mL (ref 0.35–5.50)

## 2024-06-21 LAB — PSA: PSA: 5.21 ng/mL — ABNORMAL HIGH (ref 0.10–4.00)

## 2024-06-21 MED ORDER — TADALAFIL 20 MG PO TABS
20.0000 mg | ORAL_TABLET | ORAL | 3 refills | Status: AC | PRN
Start: 2024-06-21 — End: ?

## 2024-06-21 MED ORDER — CELECOXIB 100 MG PO CAPS: ORAL_CAPSULE | ORAL | 1 refills | Status: AC

## 2024-06-21 MED ORDER — ROSUVASTATIN CALCIUM 5 MG PO TABS: 5.0000 mg | ORAL_TABLET | Freq: Every day | ORAL | 3 refills | Status: AC

## 2024-06-21 NOTE — Assessment & Plan Note (Signed)
 Celebrex  low dose every day  Potential benefits of a long term Celebrex  use as well as potential risks  and complications were explained to the patient and were aknowledged.

## 2024-06-21 NOTE — Assessment & Plan Note (Addendum)
 We discussed age appropriate health related issues, including available/recomended screening tests and vaccinations. Labs were ordered to be later reviewed . All questions were answered. We discussed one or more of the following - seat belt use, use of sunscreen/sun exposure exercise, fall risk reduction, second hand smoke exposure, firearm use and storage, seat belt use, a need for adhering to healthy diet and exercise. Labs were ordered.  All questions were answered. Colon nl 2016 Dr Abran, due in 2026 Cologuard ordered Denies snoring, OSA

## 2024-06-21 NOTE — Assessment & Plan Note (Signed)
 Cor calcium  CT score 306 in 2022 ASA Other statin intolerance On Crestor  F/u w/Dr Nishan

## 2024-06-21 NOTE — Assessment & Plan Note (Signed)
Hydrate well Repeat GFR

## 2024-06-21 NOTE — Assessment & Plan Note (Signed)
Monitoring PSA 

## 2024-06-21 NOTE — Progress Notes (Signed)
 Subjective:  Patient ID: Jeffrey [name], male    DOB: 08-23-1951  Age: 73 y.o. MRN: [id number]  CC: Medical Management of Chronic Issues and Arthritis   HPI Jeffrey [name] presents for OA, ED, dyslipidemia Well exam Denies snoring, OSA Outpatient Medications Prior to Visit  Medication Sig Dispense Refill   aspirin 81 MG tablet Take 81 mg by mouth daily.     B Complex-Folic Acid (B COMPLEX PLUS) TABS Take 1 tablet by mouth daily. 100 tablet 3   Cholecalciferol (VITAMIN D3) 2000 units TABS Take 1 tablet by mouth.     finasteride  (PROSCAR ) 5 MG tablet Take 1 tablet (5 mg total) by mouth daily. Overdue for yearly physical w/labs must see MF for refills 90 tablet 3   Fluorouracil  (TOLAK ) 4 % CREA Apply 1 application. topically daily. Qhs x 28 days 40 g 1   imiquimod  (ALDARA ) 5 % cream Apply to thigh 3 nights a wk for 8 wks 6 each 0   Omega-3 Fatty Acids (FISH OIL) 1000 MG CAPS Take 1 capsule by mouth daily.     omeprazole  (PRILOSEC) 20 MG capsule Take 20 mg by mouth daily.     tamsulosin  (FLOMAX ) 0.4 MG CAPS capsule TAKE 1 CAPSULE(0.4 MG) BY MOUTH DAILY 90 capsule 2   telmisartan  (MICARDIS ) 80 MG tablet TAKE 1 TABLET(80 MG) BY MOUTH DAILY 90 tablet 3   triamterene -hydrochlorothiazide  (MAXZIDE-25) 37.5-25 MG tablet Take 1 tablet by mouth daily. 90 tablet 3   celecoxib  (CELEBREX ) 100 MG capsule TAKE 1 CAPSULE(100 MG) BY MOUTH DAILY 90 capsule 1   rosuvastatin  (CRESTOR ) 5 MG tablet TAKE 1 TABLET(5 MG) BY MOUTH DAILY. 90 tablet 3   No facility-administered medications prior to visit.    ROS: Review of Systems  Constitutional:  Negative for appetite change, fatigue and unexpected weight change.  HENT:  Negative for congestion, nosebleeds, sneezing, sore throat and trouble swallowing.   Eyes:  Negative for itching and visual disturbance.  Respiratory:  Negative for cough.   Cardiovascular:  Negative for chest pain, palpitations and leg swelling.  Gastrointestinal:  Negative for abdominal  distention, blood in stool, diarrhea and nausea.  Genitourinary:  Negative for frequency and hematuria.  Musculoskeletal:  Positive for arthralgias. Negative for back pain, gait problem, joint swelling and neck pain.  Skin:  Negative for rash.  Neurological:  Negative for dizziness, tremors, speech difficulty and weakness.  Psychiatric/Behavioral:  Negative for agitation, dysphoric mood and sleep disturbance. The patient is not nervous/anxious.     Objective:  BP 126/68 (BP Location: Left Arm, Patient Position: Sitting)   Pulse 60   Temp 97.6 F (36.4 C) (Temporal)   Ht 5' 9.5 (1.765 m)   Wt 247 lb 9.6 oz (112.3 kg)   SpO2 96%   BMI 36.04 kg/m   BP Readings from Last 3 Encounters:  06/21/24 126/68  06/16/24 118/72  03/30/24 122/60    Wt Readings from Last 3 Encounters:  06/21/24 247 lb 9.6 oz (112.3 kg)  06/16/24 249 lb (112.9 kg)  03/30/24 246 lb 12.8 oz (111.9 kg)    Physical Exam Constitutional:      General: He is not in acute distress.    Appearance: He is well-developed. He is obese.     Comments: NAD  Eyes:     Conjunctiva/sclera: Conjunctivae normal.     Pupils: Pupils are equal, round, and reactive to light.  Neck:     Thyroid : No thyromegaly.     Vascular: No JVD.  Cardiovascular:     Rate and Rhythm: Normal rate and regular rhythm.     Heart sounds: Normal heart sounds. No murmur heard.    No friction rub. No gallop.  Pulmonary:     Effort: Pulmonary effort is normal. No respiratory distress.     Breath sounds: Normal breath sounds. No wheezing or rales.  Chest:     Chest wall: No tenderness.  Abdominal:     General: Bowel sounds are normal. There is no distension.     Palpations: Abdomen is soft. There is no mass.     Tenderness: There is no abdominal tenderness. There is no guarding or rebound.  Musculoskeletal:        General: No tenderness. Normal range of motion.     Cervical back: Normal range of motion.  Lymphadenopathy:     Cervical: No  cervical adenopathy.  Skin:    General: Skin is warm and dry.     Findings: No rash.  Neurological:     Mental Status: He is alert and oriented to person, place, and time.     Cranial Nerves: No cranial nerve deficit.     Motor: No abnormal muscle tone.     Coordination: Coordination normal.     Gait: Gait normal.     Deep Tendon Reflexes: Reflexes are normal and symmetric.  Psychiatric:        Behavior: Behavior normal.        Thought Content: Thought content normal.        Judgment: Judgment normal.   Hernia - midline and umbilical  Lab Results  Component Value Date   WBC 9.2 02/02/2023   HGB 14.3 02/02/2023   HCT 43.4 02/02/2023   PLT 359.0 02/02/2023   GLUCOSE 106 (H) 06/03/2023   CHOL 117 02/02/2023   TRIG 52.0 02/02/2023   HDL 48.50 02/02/2023   LDLDIRECT 151.8 05/15/2010   LDLCALC 59 02/02/2023   ALT 12 06/03/2023   AST 20 06/03/2023   NA 137 06/03/2023   K 4.0 06/03/2023   CL 101 06/03/2023   CREATININE 1.13 06/03/2023   BUN 22 06/03/2023   CO2 30 06/03/2023   TSH 1.62 02/02/2023   PSA 4.01 (H) 02/02/2023   INR 0.93 08/12/2010    CT ABDOMEN PELVIS W CONTRAST Result Date: 03/12/2023 CLINICAL DATA:  Incisional hernia.  Previous splenectomy. EXAM: CT ABDOMEN AND PELVIS WITH CONTRAST TECHNIQUE: Multidetector CT imaging of the abdomen and pelvis was performed using the standard protocol following bolus administration of intravenous contrast. RADIATION DOSE REDUCTION: This exam was performed according to the departmental dose-optimization program which includes automated exposure control, adjustment of the mA and/or kV according to patient size and/or use of iterative reconstruction technique. CONTRAST:  100mL ISOVUE -300 IOPAMIDOL  (ISOVUE -300) INJECTION 61% COMPARISON:  12/02/2020 FINDINGS: Lower Chest: No acute findings. Hepatobiliary: No hepatic masses identified. 1.7 cm calcified gallstone again seen, however there is no evidence of cholecystitis or biliary ductal  dilatation. Pancreas: 11 mm cystic lesion in the pancreatic tail on image 16/2 remains stable, most likely representing an indolent side-branch IPMN. Otherwise unremarkable. Spleen: Prior splenectomy. Adrenals/Urinary Tract: Stable small benign-appearing right renal cysts again noted (No followup imaging is recommended). No suspicious masses identified. No evidence of ureteral calculi or hydronephrosis. Stomach/Bowel: Small hiatal hernia again noted. No evidence of obstruction, inflammatory process or abnormal fluid collections. Mild sigmoid diverticulosis is again noted, however there is no evidence of diverticulitis. Vascular/Lymphatic: No pathologically enlarged lymph nodes. No acute vascular findings. Aortic atherosclerotic  calcification incidentally noted. Reproductive:  Stable mild-to-moderately enlarged prostate gland. Other: Anterior abdominal wall surgical mesh again seen. A small midline ventral hernia is seen just inferior to the surgical mesh, which contains a single small bowel loop. No evidence of bowel obstruction or strangulation. Small left inguinal hernia which contains only fat is unchanged. Musculoskeletal:  No suspicious bone lesions identified. IMPRESSION: Stable small midline ventral hernia just inferior to surgical mesh, which contains a single small bowel loop. No evidence of bowel obstruction or strangulation. Stable small left inguinal hernia, which contains only fat. Stable small hiatal hernia. Cholelithiasis. No radiographic evidence of cholecystitis. Stable enlarged prostate. Colonic diverticulosis, without radiographic evidence of diverticulitis. Aortic Atherosclerosis (ICD10-I70.0). Stable 11 mm cystic lesion in pancreatic tail, most likely representing an indolent side-branch IPMN. Recommend continued imaging follow-up with abdomen CT or MRI in 2 years. This recommendation follows ACR consensus guidelines: Management of Incidental Pancreatic Cysts: A White Paper of the ACR Incidental  Findings Committee. J Am Coll Radiol 2017;14:911-923. Electronically Signed   By: Norleen DELENA Kil M.D.   On: 03/12/2023 11:06    Assessment & Plan:   Problem List Items Addressed This Visit     Coronary artery disease due to lipid rich plaque   Cor calcium  CT score 306 in 2022 ASA Other statin intolerance On Crestor  F/u w/Dr Delford      Relevant Medications   tadalafil  (CIALIS ) 20 MG tablet   rosuvastatin  (CRESTOR ) 5 MG tablet   Other Relevant Orders   TSH   Urinalysis   Decreased GFR    Hydrate well. Repeat GFR      Relevant Orders   TSH   Urinalysis   CBC with Differential/Platelet   Comprehensive metabolic panel with GFR   Dyslipidemia   Relevant Medications   rosuvastatin  (CRESTOR ) 5 MG tablet   Other Relevant Orders   TSH   Lipid panel   Low back pain   Celebrex  low dose every day  Potential benefits of a long term Celebrex  use as well as potential risks  and complications were explained to the patient and were aknowledged.       Relevant Medications   celecoxib  (CELEBREX ) 100 MG capsule   Other Relevant Orders   TSH   Urinalysis   PSA elevation   Monitoring PSA      Relevant Orders   PSA   Well adult exam - Primary   We discussed age appropriate health related issues, including available/recomended screening tests and vaccinations. Labs were ordered to be later reviewed . All questions were answered. We discussed one or more of the following - seat belt use, use of sunscreen/sun exposure exercise, fall risk reduction, second hand smoke exposure, firearm use and storage, seat belt use, a need for adhering to healthy diet and exercise. Labs were ordered.  All questions were answered. Colon nl 2016 Dr Abran, due in 2026 Cologuard ordered Denies snoring, OSA      Relevant Orders   TSH   Urinalysis   Lipid panel   Comprehensive metabolic panel with GFR   Other Visit Diagnoses       Screening for colon cancer       Relevant Orders   Cologuard          Meds ordered this encounter  Medications   celecoxib  (CELEBREX ) 100 MG capsule    Sig: TAKE 1 CAPSULE(100 MG) BY MOUTH DAILY    Dispense:  90 capsule    Refill:  1   tadalafil  (CIALIS )  20 MG tablet    Sig: Take 1 tablet (20 mg total) by mouth every three (3) days as needed for erectile dysfunction.    Dispense:  30 tablet    Refill:  3   rosuvastatin  (CRESTOR ) 5 MG tablet    Sig: Take 1 tablet (5 mg total) by mouth daily.    Dispense:  90 tablet    Refill:  3      Follow-up: Return in about 6 months (around 12/22/2024) for a follow-up visit.  Marolyn Noel, MD

## 2024-06-25 ENCOUNTER — Ambulatory Visit: Payer: Self-pay | Admitting: Internal Medicine

## 2024-07-13 LAB — COLOGUARD

## 2024-07-13 NOTE — Progress Notes (Unsigned)
 [facility] [address] [place] [address] Phone: [phone] Subjective:   ISusannah Gully, am serving as a scribe for Dr. Arthea Claudene.  I'm seeing this patient by the request  of:  Plotnikov, Karlynn GAILS, MD  CC: Right shoulder and right knee pain  YEP:Dlagzrupcz  Orien CAELUM FEDERICI is a 73 y.o. male coming in with complaint of R shoulder pain. Seen for L AC joint pain in 2024. Patient states jerked shoulder Wednesday and its been hurting him since. R knee has also been bothering him.      Past Medical History:  Diagnosis Date   Anxiety    Arthritis    knees   Blood transfusion without reported diagnosis    BPH (benign prostatic hypertrophy)    Cataract    beginnings of   Chronic kidney disease    kidney infection   Depression    GERD (gastroesophageal reflux disease)    Hypertension    Meralgia paresthetica of right side    SCCA (squamous cell carcinoma) of skin 11/05/2020   Left Thigh-Anterior (in situ) (curet and 5FU)   Past Surgical History:  Procedure Laterality Date   HERNIA REPAIR     inguinal, umbilical   SPLENECTOMY     Social History   Socioeconomic History   Marital status: Married    Spouse name: Arland   Number of children: 2   Years of education: 12   Highest education level: 12th grade  Occupational History   Occupation: Still Working  Tobacco Use   Smoking status: Never   Smokeless tobacco: Never  Vaping Use   Vaping status: Never Used  Substance and Sexual Activity   Alcohol use: Yes    Alcohol/week: 0.0 standard drinks of alcohol    Comment: occasional wine   Drug use: No   Sexual activity: Yes  Other Topics Concern   Not on file  Social History Narrative   Regular exercise-Yes (goes to the gym 3 times per week for 60 minutes)      Lives with wife/2025   Social Drivers of Health   Financial Resource Strain: Low Risk  (06/16/2024)   Overall Financial Resource Strain (CARDIA)    Difficulty of  Paying Living Expenses: Not hard at all  Food Insecurity: No Food Insecurity (06/16/2024)   Hunger Vital Sign    Worried About Running Out of Food in the Last Year: Never true    Ran Out of Food in the Last Year: Never true  Transportation Needs: No Transportation Needs (06/16/2024)   PRAPARE - Administrator, Civil Service (Medical): No    Lack of Transportation (Non-Medical): No  Physical Activity: Inactive (06/16/2024)   Exercise Vital Sign    Days of Exercise per Week: 0 days    Minutes of Exercise per Session: 0 min  Stress: No Stress Concern Present (06/16/2024)   [employer] of Occupational Health - Occupational Stress Questionnaire    Feeling of Stress: Only a little  Social Connections: Socially Integrated (06/16/2024)   Social Connection and Isolation Panel    Frequency of Communication with Friends and Family: More than three times a week    Frequency of Social Gatherings with Friends and Family: Not on file    Attends Religious Services: More than 4 times per year    Active Member of [employer] or Organizations: Yes    Attends Banker Meetings: Never    Marital Status: Married   Allergies  Allergen Reactions   Bupropion Hcl     Unsure of reaction   Lovastatin     REACTION: myalgia   Statins     weak   Venlafaxine     Unsure of reaction   Family History  Problem Relation Age of Onset   Heart disease Mother 55       chf   Hypertension Other    Colon cancer Neg Hx    Esophageal cancer Neg Hx    Stomach cancer Neg Hx    Rectal cancer Neg Hx      Current Outpatient Medications (Cardiovascular):    rosuvastatin  (CRESTOR ) 5 MG tablet, Take 1 tablet (5 mg total) by mouth daily.   tadalafil  (CIALIS ) 20 MG tablet, Take 1 tablet (20 mg total) by mouth every three (3) days as needed for erectile dysfunction.   telmisartan  (MICARDIS ) 80 MG tablet, TAKE 1 TABLET(80 MG) BY MOUTH DAILY   triamterene -hydrochlorothiazide  (MAXZIDE-25) 37.5-25 MG  tablet, Take 1 tablet by mouth daily.   Current Outpatient Medications (Analgesics):    aspirin 81 MG tablet, Take 81 mg by mouth daily.   celecoxib  (CELEBREX ) 100 MG capsule, TAKE 1 CAPSULE(100 MG) BY MOUTH DAILY   Current Outpatient Medications (Other):    B Complex-Folic Acid (B COMPLEX PLUS) TABS, Take 1 tablet by mouth daily.   Cholecalciferol (VITAMIN D3) 2000 units TABS, Take 1 tablet by mouth.   finasteride  (PROSCAR ) 5 MG tablet, Take 1 tablet (5 mg total) by mouth daily. Overdue for yearly physical w/labs must see MF for refills   Fluorouracil  (TOLAK ) 4 % CREA, Apply 1 application. topically daily. Qhs x 28 days   imiquimod  (ALDARA ) 5 % cream, Apply to thigh 3 nights a wk for 8 wks   Omega-3 Fatty Acids (FISH OIL) 1000 MG CAPS, Take 1 capsule by mouth daily.   omeprazole  (PRILOSEC) 20 MG capsule, Take 20 mg by mouth daily.   tamsulosin  (FLOMAX ) 0.4 MG CAPS capsule, TAKE 1 CAPSULE(0.4 MG) BY MOUTH DAILY   Reviewed prior external information including notes and imaging from  primary care provider As well as notes that were available from care everywhere and other healthcare systems.  Past medical history, social, surgical and family history all reviewed in electronic medical record.  No pertanent information unless stated regarding to the chief complaint.   Review of Systems:  No headache, visual changes, nausea, vomiting, diarrhea, constipation, dizziness, abdominal pain, skin rash, fevers, chills, night sweats, weight loss, swollen lymph nodes, body aches, joint swelling, chest pain, shortness of breath, mood changes. POSITIVE muscle aches  Objective  Blood pressure 126/70, pulse 61, height 5' 9 (1.753 m), weight 250 lb (113.4 kg), SpO2 96%.   General: No apparent distress alert and oriented x3 mood and affect normal, dressed appropriately.  HEENT: Pupils equal, extraocular movements intact  Respiratory: Patient's speak in full sentences and does not appear short of breath   Severely antalgic gait noted.  Tender to palpation mostly over the right knee.  Trace effusion noted.  Varus deformity of the knee noted.  Instability with valgus and varus force  Right shoulder does have significant weakness with 2 out of 5 strength of the rotator cuff.  Positive O'Brien sign noted.   Procedure: Real-time Ultrasound Guided Injection of right glenohumeral joint Device: GE Logiq Q7  Ultrasound guided injection is preferred based studies that show increased duration, increased effect, greater accuracy, decreased procedural pain, increased response rate with ultrasound guided versus blind injection.  Verbal informed consent  obtained.  Time-out conducted.  Noted no overlying erythema, induration, or other signs of local infection.  Skin prepped in a sterile fashion.  Local anesthesia: Topical Ethyl chloride.  With sterile technique and under real time ultrasound guidance:  Joint visualized.  23g 1  inch needle inserted posterior approach. Pictures taken for needle placement. Patient did have injection of , 2 cc of 0.5% Marcaine, and 1.0 cc of Kenalog 40 mg/dL. Completed without difficulty  Pain immediately resolved suggesting accurate placement of the medication.  Advised to call if fevers/chills, erythema, induration, drainage, or persistent bleeding.  Images saved Impression: Technically successful ultrasound guided injection.  After informed written and verbal consent, patient was seated on exam table. Right knee was prepped with alcohol swab and utilizing anterolateral approach, patient's right knee space was injected with 4:1  marcaine 0.5%: Kenalog 40mg /dL. Patient tolerated the procedure well without immediate complications.   Impression and Recommendations:    The above documentation has been reviewed and is accurate and complete Jazilyn Siegenthaler M Charlsie Fleeger, DO

## 2024-07-14 ENCOUNTER — Encounter: Payer: Self-pay | Admitting: Family Medicine

## 2024-07-14 ENCOUNTER — Ambulatory Visit (INDEPENDENT_AMBULATORY_CARE_PROVIDER_SITE_OTHER): Admitting: Family Medicine

## 2024-07-14 ENCOUNTER — Other Ambulatory Visit: Payer: Self-pay

## 2024-07-14 VITALS — BP 126/70 | HR 61 | Ht 69.0 in | Wt 250.0 lb

## 2024-07-14 DIAGNOSIS — M25511 Pain in right shoulder: Secondary | ICD-10-CM

## 2024-07-14 DIAGNOSIS — M75121 Complete rotator cuff tear or rupture of right shoulder, not specified as traumatic: Secondary | ICD-10-CM | POA: Diagnosis not present

## 2024-07-14 DIAGNOSIS — M170 Bilateral primary osteoarthritis of knee: Secondary | ICD-10-CM | POA: Diagnosis not present

## 2024-07-14 NOTE — Patient Instructions (Signed)
 Injection in R knee and shoulder Good to see you! See you again in

## 2024-07-14 NOTE — Assessment & Plan Note (Signed)
 Rotator cuff arthropathy.  The patient wants to avoid any surgical intervention.  Chronic problem that is worsening though.  Discussed icing regimen and home exercises, discussed which activities to do and which ones to avoid.  Increase activity slowly otherwise.  Follow-up with me again 6 to 8 weeks otherwise.

## 2024-07-14 NOTE — Assessment & Plan Note (Signed)
 Right sided injected in June July 14, 2024.  Chronic problem with worsening symptoms.  Still wants to avoid any type of surgical intervention.  Severe varus deformity of the knee noted.  Patient likely does not have any significant instability with certain activities but patient has changed what activities he is doing regularly.  Follow-up with me again in 6 to 8 weeks otherwise.

## 2024-09-26 NOTE — Telephone Encounter (Signed)
 Noted

## 2024-09-26 NOTE — Telephone Encounter (Signed)
 Patient is scheduled for gel injections on 10/12/24. I assume Monovisc. Is authorization still okay?

## 2024-10-11 ENCOUNTER — Telehealth: Payer: Self-pay

## 2024-10-11 NOTE — Telephone Encounter (Signed)
 Patient scheduled tomorrow 10/12/2024  Monovisc authorized for bilateral knee MEDICARE PRE CERT NOT REQUIRED Patient responsible for 20% coinsurance  OOP does not apply Deductible %257 has met $257 Since deductible has been met patient responsible for coinsurance Reference # Availity 10/11/2024  BCBS NO PRE CERT REQUIRED  Follows medicare guidelines  Will pick up remaining eligible expenses at 100% Covers medicare part B deducible Reference #Availity 10/11/2024

## 2024-10-11 NOTE — Telephone Encounter (Signed)
-----   Message from Berwyn Posey sent at 10/11/2024  8:04 AM EST ----- Regarding: visco Can you please run patient for B visco injections? We see him tomorrow morning. Last injected in June 2025.   Thank you

## 2024-10-11 NOTE — Telephone Encounter (Signed)
 Patient ran for bilateral Monovisc on 10/11/24. Case ID: [id number]. Pending approval.

## 2024-10-11 NOTE — Progress Notes (Addendum)
 " Darlyn Claudene [facility] [address] [place] [address] Phone: [phone] Subjective:   ISusannah [name], am serving as a scribe for Dr. Arthea Claudene.  I'm seeing this patient by the request  of:  Plotnikov, Karlynn GAILS, MD  CC: Right shoulder pain  YEP:Dlagzrupcz  07/14/2024 Rotator cuff arthropathy.  The patient wants to avoid any surgical intervention.  Chronic problem that is worsening though.  Discussed icing regimen and home exercises, discussed which activities to do and which ones to avoid.  Increase activity slowly otherwise.  Follow-up with me again 6 to 8 weeks otherwise.     Right sided injected in June July 14, 2024.  Chronic problem with worsening symptoms.  Still wants to avoid any type of surgical intervention.  Severe varus deformity of the knee noted.  Patient likely does not have any significant instability with certain activities but patient has changed what activities he is doing regularly.  Follow-up with me again in 6 to 8 weeks otherwise.      Update 10/12/2024 Jeffrey [name] is a 73 y.o. male coming in with complaint of R shoulder and R knee pain. Patient states shoulder is doing much better than it was. Would be ok with another injection. Here for gel injections in knees.  Does have pain in the knees bilaterally.      Past Medical History:  Diagnosis Date   Anxiety    Arthritis    knees   Blood transfusion without reported diagnosis    BPH (benign prostatic hypertrophy)    Cataract    beginnings of   Chronic kidney disease    kidney infection   Depression    GERD (gastroesophageal reflux disease)    Hypertension    Meralgia paresthetica of right side    SCCA (squamous cell carcinoma) of skin 11/05/2020   Left Thigh-Anterior (in situ) (curet and 5FU)   Past Surgical History:  Procedure Laterality Date   HERNIA REPAIR     inguinal, umbilical   SPLENECTOMY     Social History   Socioeconomic History   Marital  status: Married    Spouse name: Arland   Number of children: 2   Years of education: 12   Highest education level: 12th grade  Occupational History   Occupation: Still Working  Tobacco Use   Smoking status: Never   Smokeless tobacco: Never  Vaping Use   Vaping status: Never Used  Substance and Sexual Activity   Alcohol use: Yes    Alcohol/week: 0.0 standard drinks of alcohol    Comment: occasional wine   Drug use: No   Sexual activity: Yes  Other Topics Concern   Not on file  Social History Narrative   Regular exercise-Yes (goes to the gym 3 times per week for 60 minutes)      Lives with wife/2025   Social Drivers of Health   Tobacco Use: Low Risk (07/14/2024)   Patient History    Smoking Tobacco Use: Never    Smokeless Tobacco Use: Never    Passive Exposure: Not on file  Financial Resource Strain: Low Risk (06/16/2024)   Overall Financial Resource Strain (CARDIA)    Difficulty of Paying Living Expenses: Not hard at all  Food Insecurity: No Food Insecurity (06/16/2024)   Epic    Worried About Radiation Protection Practitioner of Food in the Last Year: Never true    Ran Out of Food in the Last Year: Never true  Transportation Needs: No Transportation Needs (  06/16/2024)   Epic    Lack of Transportation (Medical): No    Lack of Transportation (Non-Medical): No  Physical Activity: Inactive (06/16/2024)   Exercise Vital Sign    Days of Exercise per Week: 0 days    Minutes of Exercise per Session: 0 min  Stress: No Stress Concern Present (06/16/2024)   [employer] of Occupational Health - Occupational Stress Questionnaire    Feeling of Stress: Only a little  Social Connections: Socially Integrated (06/16/2024)   Social Connection and Isolation Panel    Frequency of Communication with Friends and Family: More than three times a week    Frequency of Social Gatherings with Friends and Family: Not on file    Attends Religious Services: More than 4 times per year    Active Member of Clubs or  Organizations: Yes    Attends Banker Meetings: Never    Marital Status: Married  Depression (PHQ2-9): Low Risk (06/16/2024)   Depression (PHQ2-9)    PHQ-2 Score: 2  Alcohol Screen: Low Risk (06/16/2024)   Alcohol Screen    Last Alcohol Screening Score (AUDIT): 0  Housing: Unknown (06/16/2024)   Epic    Unable to Pay for Housing in the Last Year: No    Number of Times Moved in the Last Year: Not on file    Homeless in the Last Year: No  Utilities: Not At Risk (06/16/2024)   Epic    Threatened with loss of utilities: No  Health Literacy: Not on file   Allergies[1] Family History  Problem Relation Age of Onset   Heart disease Mother 14       chf   Hypertension Other    Colon cancer Neg Hx    Esophageal cancer Neg Hx    Stomach cancer Neg Hx    Rectal cancer Neg Hx     Current Outpatient Medications (Cardiovascular):    rosuvastatin  (CRESTOR ) 5 MG tablet, Take 1 tablet (5 mg total) by mouth daily.   tadalafil  (CIALIS ) 20 MG tablet, Take 1 tablet (20 mg total) by mouth every three (3) days as needed for erectile dysfunction.   telmisartan  (MICARDIS ) 80 MG tablet, TAKE 1 TABLET(80 MG) BY MOUTH DAILY   triamterene -hydrochlorothiazide  (MAXZIDE-25) 37.5-25 MG tablet, Take 1 tablet by mouth daily.  Current Outpatient Medications (Analgesics):    aspirin 81 MG tablet, Take 81 mg by mouth daily.   celecoxib  (CELEBREX ) 100 MG capsule, TAKE 1 CAPSULE(100 MG) BY MOUTH DAILY  Current Outpatient Medications (Other):    B Complex-Folic Acid (B COMPLEX PLUS) TABS, Take 1 tablet by mouth daily.   Cholecalciferol (VITAMIN D3) 2000 units TABS, Take 1 tablet by mouth.   finasteride  (PROSCAR ) 5 MG tablet, Take 1 tablet (5 mg total) by mouth daily. Overdue for yearly physical w/labs must see MF for refills   Fluorouracil  (TOLAK ) 4 % CREA, Apply 1 application. topically daily. Qhs x 28 days   imiquimod  (ALDARA ) 5 % cream, Apply to thigh 3 nights a wk for 8 wks   Omega-3 Fatty Acids  (FISH OIL) 1000 MG CAPS, Take 1 capsule by mouth daily.   omeprazole  (PRILOSEC) 20 MG capsule, Take 20 mg by mouth daily.   tamsulosin  (FLOMAX ) 0.4 MG CAPS capsule, TAKE 1 CAPSULE(0.4 MG) BY MOUTH DAILY   Reviewed prior external information including notes and imaging from  primary care provider As well as notes that were available from care everywhere and other healthcare systems.  Past medical history, social, surgical and family history  all reviewed in electronic medical record.  No pertanent information unless stated regarding to the chief complaint.   Review of Systems:  No headache, visual changes, nausea, vomiting, diarrhea, constipation, dizziness, abdominal pain, skin rash, fevers, chills, night sweats, weight loss, swollen lymph nodes, body aches, joint swelling, chest pain, shortness of breath, mood changes. POSITIVE muscle aches  Objective  Blood pressure 128/80, pulse 69, height 5' 9 (1.753 m), weight 249 lb (112.9 kg), SpO2 97%.   General: No apparent distress alert and oriented x3 mood and affect normal, dressed appropriately.  HEENT: Pupils equal, extraocular movements intact  Respiratory: Patient's speak in full sentences and does not appear short of breath  Cardiovascular: Trace lower extremity edema, non tender, no erythema  Does have tenderness to palpation in the knees bilaterally.  Patient does have instability with valgus and varus force. Right shoulder exam does have positive impingement noted.  Procedure: Real-time Ultrasound Guided Injection of right glenohumeral joint Device: GE Logiq Q7  Ultrasound guided injection is preferred based studies that show increased duration, increased effect, greater accuracy, decreased procedural pain, increased response rate with ultrasound guided versus blind injection.  Verbal informed consent obtained.  Time-out conducted.  Noted no overlying erythema, induration, or other signs of local infection.  Skin prepped in a  sterile fashion.  Local anesthesia: Topical Ethyl chloride.  With sterile technique and under real time ultrasound guidance:  Joint visualized.  23g 1  inch needle inserted posterior approach. Pictures taken for needle placement. Patient did have injection of , 2 cc of 0.5% Marcaine, and 1.0 cc of Kenalog 40 mg/dL. Completed without difficulty  Pain immediately resolved suggesting accurate placement of the medication.  Advised to call if fevers/chills, erythema, induration, drainage, or persistent bleeding.  Images saved Impression: Technically successful ultrasound guided injection.  After informed written and verbal consent, patient was seated on exam table. Right knee was prepped with alcohol swab and utilizing anterolateral approach, patient's right knee space was injected with 48 mg per 3 mL of Monovisc (sodium hyaluronate) in a prefilled syringe was injected easily into the knee through a 22-gauge needle..Patient tolerated the procedure well without immediate complications.  After informed written and verbal consent, patient was seated on exam table. Left knee was prepped with alcohol swab and utilizing anterolateral approach, patient's left knee space was injected with 48 mg per 3 mL of Monovisc (sodium hyaluronate) in a prefilled syringe was injected easily into the knee through a 22-gauge needle..Patient tolerated the procedure well without immediate complications.   Impression and Recommendations:     The above documentation has been reviewed and is accurate and complete Arthea CHRISTELLA Sharps, DO       [1]  Allergies Allergen Reactions   Bupropion Hcl     Unsure of reaction   Lovastatin     REACTION: myalgia   Statins     weak   Venlafaxine     Unsure of reaction   "

## 2024-10-11 NOTE — Telephone Encounter (Signed)
 Noted

## 2024-10-12 ENCOUNTER — Other Ambulatory Visit: Payer: Self-pay

## 2024-10-12 ENCOUNTER — Ambulatory Visit (INDEPENDENT_AMBULATORY_CARE_PROVIDER_SITE_OTHER): Admitting: Family Medicine

## 2024-10-12 VITALS — BP 128/80 | HR 69 | Ht 69.0 in | Wt 249.0 lb

## 2024-10-12 DIAGNOSIS — M75121 Complete rotator cuff tear or rupture of right shoulder, not specified as traumatic: Secondary | ICD-10-CM

## 2024-10-12 DIAGNOSIS — M25511 Pain in right shoulder: Secondary | ICD-10-CM | POA: Diagnosis not present

## 2024-10-12 DIAGNOSIS — M170 Bilateral primary osteoarthritis of knee: Secondary | ICD-10-CM | POA: Diagnosis not present

## 2024-10-12 MED ORDER — HYALURONAN 88 MG/4ML IX SOSY
176.0000 mg | PREFILLED_SYRINGE | Freq: Once | INTRA_ARTICULAR | Status: AC
  Administered 2024-10-12: 09:00:00 176 mg via INTRA_ARTICULAR

## 2024-10-12 NOTE — Assessment & Plan Note (Signed)
 Has rotator cuff arthropathy noted.  Wants to avoid any type of surgical intervention.  Has responded relatively well though to injections.  Discussed icing regimen and home exercises, which activities to do and which ones to avoid.  Increase activity slowly.  Follow-up again in 6 to 12 weeks.

## 2024-10-12 NOTE — Assessment & Plan Note (Signed)
 Patient has responded in the past.  Hopeful that this will make a significant improvement again.  Has been able to stay active for the last 3 years with these types of injections.  Patient is trying to continue with daily activities but does have social determinant of health that does have difficulty with doing anything more than that.  Follow-up with me again in 6 to 12 weeks

## 2024-10-12 NOTE — Patient Instructions (Addendum)
 Injection in shoulder today Gel injections in knees today Good to see you!  Happy Holidays See you again in 3 months

## 2024-11-02 ENCOUNTER — Other Ambulatory Visit: Payer: Self-pay | Admitting: Internal Medicine

## 2024-11-15 ENCOUNTER — Other Ambulatory Visit: Payer: Self-pay | Admitting: Internal Medicine

## 2024-12-01 ENCOUNTER — Ambulatory Visit: Admitting: Internal Medicine

## 2024-12-01 ENCOUNTER — Ambulatory Visit (INDEPENDENT_AMBULATORY_CARE_PROVIDER_SITE_OTHER)

## 2024-12-01 ENCOUNTER — Encounter: Payer: Self-pay | Admitting: Internal Medicine

## 2024-12-01 VITALS — BP 136/84 | HR 74 | Ht 69.0 in | Wt 247.2 lb

## 2024-12-01 DIAGNOSIS — R0609 Other forms of dyspnea: Secondary | ICD-10-CM | POA: Insufficient documentation

## 2024-12-01 DIAGNOSIS — I10 Essential (primary) hypertension: Secondary | ICD-10-CM

## 2024-12-01 DIAGNOSIS — Z1211 Encounter for screening for malignant neoplasm of colon: Secondary | ICD-10-CM

## 2024-12-01 DIAGNOSIS — I2510 Atherosclerotic heart disease of native coronary artery without angina pectoris: Secondary | ICD-10-CM

## 2024-12-01 DIAGNOSIS — G8929 Other chronic pain: Secondary | ICD-10-CM

## 2024-12-01 DIAGNOSIS — K6289 Other specified diseases of anus and rectum: Secondary | ICD-10-CM | POA: Insufficient documentation

## 2024-12-01 DIAGNOSIS — R739 Hyperglycemia, unspecified: Secondary | ICD-10-CM | POA: Insufficient documentation

## 2024-12-01 LAB — URINALYSIS
Bilirubin Urine: NEGATIVE
Hgb urine dipstick: NEGATIVE
Ketones, ur: NEGATIVE
Leukocytes,Ua: NEGATIVE
Nitrite: NEGATIVE
Specific Gravity, Urine: 1.015 (ref 1.000–1.030)
Total Protein, Urine: NEGATIVE
Urine Glucose: NEGATIVE
Urobilinogen, UA: 1 (ref 0.0–1.0)
pH: 7.5 (ref 5.0–8.0)

## 2024-12-01 LAB — CBC WITH DIFFERENTIAL/PLATELET
Basophils Absolute: 0 10*3/uL (ref 0.0–0.1)
Basophils Relative: 0.4 % (ref 0.0–3.0)
Eosinophils Absolute: 0.1 10*3/uL (ref 0.0–0.7)
Eosinophils Relative: 0.8 % (ref 0.0–5.0)
HCT: 46.8 % (ref 39.0–52.0)
Hemoglobin: 15.7 g/dL (ref 13.0–17.0)
Lymphocytes Relative: 18.3 % (ref 12.0–46.0)
Lymphs Abs: 1.8 10*3/uL (ref 0.7–4.0)
MCHC: 33.5 g/dL (ref 30.0–36.0)
MCV: 99.1 fl (ref 78.0–100.0)
Monocytes Absolute: 1.1 10*3/uL — ABNORMAL HIGH (ref 0.1–1.0)
Monocytes Relative: 10.6 % (ref 3.0–12.0)
Neutro Abs: 7.1 10*3/uL (ref 1.4–7.7)
Neutrophils Relative %: 69.9 % (ref 43.0–77.0)
Platelets: 285 10*3/uL (ref 150.0–400.0)
RBC: 4.72 Mil/uL (ref 4.22–5.81)
RDW: 13.8 % (ref 11.5–15.5)
WBC: 10.1 10*3/uL (ref 4.0–10.5)

## 2024-12-01 LAB — LIPID PANEL
Cholesterol: 159 mg/dL (ref 28–200)
HDL: 55 mg/dL
LDL Cholesterol: 89 mg/dL (ref 10–99)
NonHDL: 103.91
Total CHOL/HDL Ratio: 3
Triglycerides: 74 mg/dL (ref 10.0–149.0)
VLDL: 14.8 mg/dL (ref 0.0–40.0)

## 2024-12-01 LAB — COMPREHENSIVE METABOLIC PANEL WITH GFR
ALT: 17 U/L (ref 3–53)
AST: 21 U/L (ref 5–37)
Albumin: 3.9 g/dL (ref 3.5–5.2)
Alkaline Phosphatase: 97 U/L (ref 39–117)
BUN: 18 mg/dL (ref 6–23)
CO2: 34 meq/L — ABNORMAL HIGH (ref 19–32)
Calcium: 9.9 mg/dL (ref 8.4–10.5)
Chloride: 99 meq/L (ref 96–112)
Creatinine, Ser: 1.2 mg/dL (ref 0.40–1.50)
GFR: 59.43 mL/min — ABNORMAL LOW
Glucose, Bld: 112 mg/dL — ABNORMAL HIGH (ref 70–99)
Potassium: 4.3 meq/L (ref 3.5–5.1)
Sodium: 140 meq/L (ref 135–145)
Total Bilirubin: 1.1 mg/dL (ref 0.2–1.2)
Total Protein: 7.4 g/dL (ref 6.0–8.3)

## 2024-12-01 LAB — T4, FREE: Free T4: 0.75 ng/dL (ref 0.60–1.60)

## 2024-12-01 LAB — VITAMIN B12: Vitamin B-12: 539 pg/mL (ref 211–911)

## 2024-12-01 LAB — BRAIN NATRIURETIC PEPTIDE: Pro B Natriuretic peptide (BNP): 49 pg/mL (ref 1.0–100.0)

## 2024-12-01 LAB — HEMOGLOBIN A1C: Hgb A1c MFr Bld: 6.3 % (ref 4.6–6.5)

## 2024-12-01 LAB — SEDIMENTATION RATE: Sed Rate: 12 mm/h (ref 0–20)

## 2024-12-01 MED ORDER — HYDROCODONE-ACETAMINOPHEN 5-325 MG PO TABS: 1.0000 | ORAL_TABLET | Freq: Four times a day (QID) | ORAL | 0 refills | Status: AC | PRN

## 2024-12-01 MED ORDER — HYDROCORTISONE ACETATE 25 MG RE SUPP: 25.0000 mg | Freq: Two times a day (BID) | RECTAL | 1 refills | Status: AC

## 2024-12-01 NOTE — Assessment & Plan Note (Signed)
" °  Cont on Micardis  and Maxzide "

## 2024-12-01 NOTE — Assessment & Plan Note (Addendum)
 New ?etiology - could be restrictive lung disease, CHF, etc 2D ECHO CXR BNP, CBC, other labs Loose wt

## 2024-12-01 NOTE — Assessment & Plan Note (Signed)
 Check A1c.

## 2024-12-01 NOTE — Assessment & Plan Note (Addendum)
?  etiology Nl rectal exam Re-do cologuard HCT supp at bedtime or bid May need a GI ref

## 2024-12-01 NOTE — Progress Notes (Signed)
 "  Subjective:  Patient ID: Jeffrey [name], male    DOB: 01-13-1951  Age: 74 y.o. MRN: [id number]  CC: Back Pain (Lower back pain for 2 weeks. Notes some new rectal pain as well as fatigue (unsure if this is walking pneumonia))   HPI Jeffrey [name] presents for R knee pain - worse: 8-10/10 C/o DOE - chronic >1-2 month. No CP C/o LBP,  rectal pain x 1 month, no blood  Outpatient Medications Prior to Visit  Medication Sig Dispense Refill   aspirin 81 MG tablet Take 81 mg by mouth daily.     B Complex-Folic Acid (B COMPLEX PLUS) TABS Take 1 tablet by mouth daily. 100 tablet 3   celecoxib  (CELEBREX ) 100 MG capsule TAKE 1 CAPSULE(100 MG) BY MOUTH DAILY 90 capsule 1   Cholecalciferol (VITAMIN D3) 2000 units TABS Take 1 tablet by mouth.     finasteride  (PROSCAR ) 5 MG tablet Take 1 tablet (5 mg total) by mouth daily. Overdue for yearly physical w/labs must see MF for refills 90 tablet 3   Fluorouracil  (TOLAK ) 4 % CREA Apply 1 application. topically daily. Qhs x 28 days 40 g 1   imiquimod  (ALDARA ) 5 % cream Apply to thigh 3 nights a wk for 8 wks 6 each 0   Omega-3 Fatty Acids (FISH OIL) 1000 MG CAPS Take 1 capsule by mouth daily.     omeprazole  (PRILOSEC) 20 MG capsule Take 20 mg by mouth daily.     rosuvastatin  (CRESTOR ) 5 MG tablet Take 1 tablet (5 mg total) by mouth daily. 90 tablet 3   tadalafil  (CIALIS ) 20 MG tablet Take 1 tablet (20 mg total) by mouth every three (3) days as needed for erectile dysfunction. 30 tablet 3   tamsulosin  (FLOMAX ) 0.4 MG CAPS capsule TAKE 1 CAPSULE(0.4 MG) BY MOUTH DAILY 90 capsule 2   telmisartan  (MICARDIS ) 80 MG tablet TAKE 1 TABLET(80 MG) BY MOUTH DAILY 90 tablet 3   triamterene -hydrochlorothiazide  (MAXZIDE-25) 37.5-25 MG tablet Take 1 tablet by mouth daily. 90 tablet 3   No facility-administered medications prior to visit.    ROS: Review of Systems  Constitutional:  Positive for fatigue. Negative for appetite change and unexpected weight change.  HENT:   Negative for congestion, nosebleeds, sneezing, sore throat and trouble swallowing.   Eyes:  Negative for itching and visual disturbance.  Respiratory:  Positive for shortness of breath. Negative for cough.   Cardiovascular:  Negative for chest pain, palpitations and leg swelling.  Gastrointestinal:  Positive for rectal pain. Negative for abdominal distention, blood in stool, diarrhea and nausea.  Genitourinary:  Negative for frequency and hematuria.  Musculoskeletal:  Positive for arthralgias, back pain and gait problem. Negative for joint swelling and neck pain.  Skin:  Negative for color change and rash.  Neurological:  Negative for dizziness, tremors, speech difficulty and weakness.  Psychiatric/Behavioral:  Negative for agitation, dysphoric mood and sleep disturbance. The patient is not nervous/anxious.     Objective:  BP 136/84   Pulse 74   Ht 5' 9 (1.753 m)   Wt 247 lb 3.2 oz (112.1 kg)   SpO2 96%   BMI 36.51 kg/m   BP Readings from Last 3 Encounters:  12/01/24 136/84  10/12/24 128/80  07/14/24 126/70    Wt Readings from Last 3 Encounters:  12/01/24 247 lb 3.2 oz (112.1 kg)  10/12/24 249 lb (112.9 kg)  07/14/24 250 lb (113.4 kg)    Physical Exam Constitutional:  General: He is not in acute distress.    Appearance: He is well-developed. He is obese.     Comments: NAD  Eyes:     Conjunctiva/sclera: Conjunctivae normal.     Pupils: Pupils are equal, round, and reactive to light.  Neck:     Thyroid : No thyromegaly.     Vascular: No JVD.  Cardiovascular:     Rate and Rhythm: Normal rate and regular rhythm.     Heart sounds: Normal heart sounds. No murmur heard.    No friction rub. No gallop.  Pulmonary:     Effort: Pulmonary effort is normal. No respiratory distress.     Breath sounds: Normal breath sounds. No wheezing or rales.  Chest:     Chest wall: No tenderness.  Abdominal:     General: Bowel sounds are normal. There is no distension.     Palpations:  Abdomen is soft. There is no mass.     Tenderness: There is no abdominal tenderness. There is no guarding or rebound.  Genitourinary:    Prostate: Normal.     Rectum: Normal. Guaiac result negative.  Musculoskeletal:        General: Tenderness present. Normal range of motion.     Cervical back: Normal range of motion.     Right lower leg: No edema.     Left lower leg: No edema.  Lymphadenopathy:     Cervical: No cervical adenopathy.  Skin:    General: Skin is warm and dry.     Findings: No rash.  Neurological:     Mental Status: He is alert and oriented to person, place, and time.     Cranial Nerves: No cranial nerve deficit.     Motor: No abnormal muscle tone.     Coordination: Coordination normal.     Gait: Gait abnormal.     Deep Tendon Reflexes: Reflexes are normal and symmetric.  Psychiatric:        Behavior: Behavior normal.        Thought Content: Thought content normal.        Judgment: Judgment normal.    R knee w/pain, limping LS spine w/pain    A total time of 45 minutes was spent preparing to see the patient, reviewing tests, x-rays, operative reports and other medical records.  Also, obtaining history and performing comprehensive physical exam.  Additionally, counseling the patient regarding the above listed issues DOE, OA opioids.   Finally, documenting clinical information in the health records, coordination of care, educating the patient. It is a complex case.   Lab Results  Component Value Date   WBC 7.8 06/21/2024   HGB 15.8 06/21/2024   HCT 47.8 06/21/2024   PLT 282.0 06/21/2024   GLUCOSE 95 06/21/2024   CHOL 168 06/21/2024   TRIG 63.0 06/21/2024   HDL 44.10 06/21/2024   LDLDIRECT 151.8 05/15/2010   LDLCALC 112 (H) 06/21/2024   ALT 12 06/21/2024   AST 16 06/21/2024   NA 139 06/21/2024   K 4.1 06/21/2024   CL 101 06/21/2024   CREATININE 1.09 06/21/2024   BUN 21 06/21/2024   CO2 28 06/21/2024   TSH 1.65 06/21/2024   PSA 5.21 (H) 06/21/2024    INR 0.93 08/12/2010    CT ABDOMEN PELVIS W CONTRAST Result Date: 03/12/2023 CLINICAL DATA:  Incisional hernia.  Previous splenectomy. EXAM: CT ABDOMEN AND PELVIS WITH CONTRAST TECHNIQUE: Multidetector CT imaging of the abdomen and pelvis was performed using the standard protocol following bolus administration of  intravenous contrast. RADIATION DOSE REDUCTION: This exam was performed according to the departmental dose-optimization program which includes automated exposure control, adjustment of the mA and/or kV according to patient size and/or use of iterative reconstruction technique. CONTRAST:  ISOVUE -300 IOPAMIDOL  (ISOVUE -300) INJECTION 61% COMPARISON:  12/02/2020 FINDINGS: Lower Chest: No acute findings. Hepatobiliary: No hepatic masses identified. 1.7 cm calcified gallstone again seen, however there is no evidence of cholecystitis or biliary ductal dilatation. Pancreas: 11 mm cystic lesion in the pancreatic tail on image 16/2 remains stable, most likely representing an indolent side-branch IPMN. Otherwise unremarkable. Spleen: Prior splenectomy. Adrenals/Urinary Tract: Stable small benign-appearing right renal cysts again noted (No followup imaging is recommended). No suspicious masses identified. No evidence of ureteral calculi or hydronephrosis. Stomach/Bowel: Small hiatal hernia again noted. No evidence of obstruction, inflammatory process or abnormal fluid collections. Mild sigmoid diverticulosis is again noted, however there is no evidence of diverticulitis. Vascular/Lymphatic: No pathologically enlarged lymph nodes. No acute vascular findings. Aortic atherosclerotic calcification incidentally noted. Reproductive:  Stable mild-to-moderately enlarged prostate gland. Other: Anterior abdominal wall surgical mesh again seen. A small midline ventral hernia is seen just inferior to the surgical mesh, which contains a single small bowel loop. No evidence of bowel obstruction or strangulation. Small left  inguinal hernia which contains only fat is unchanged. Musculoskeletal:  No suspicious bone lesions identified. IMPRESSION: Stable small midline ventral hernia just inferior to surgical mesh, which contains a single small bowel loop. No evidence of bowel obstruction or strangulation. Stable small left inguinal hernia, which contains only fat. Stable small hiatal hernia. Cholelithiasis. No radiographic evidence of cholecystitis. Stable enlarged prostate. Colonic diverticulosis, without radiographic evidence of diverticulitis. Aortic Atherosclerosis (ICD10-I70.0). Stable 11 mm cystic lesion in pancreatic tail, most likely representing an indolent side-branch IPMN. Recommend continued imaging follow-up with abdomen CT or MRI in 2 years. This recommendation follows ACR consensus guidelines: Management of Incidental Pancreatic Cysts: A White Paper of the ACR Incidental Findings Committee. J Am Coll Radiol 2017;14:911-923. Electronically Signed   By: Norleen DELENA Kil M.D.   On: 03/12/2023 11:06    Assessment & Plan:   Problem List Items Addressed This Visit     Essential hypertension    Cont on Micardis  and Maxzide      Knee pain, chronic   R>>L Advanced OA Norco prn  Potential benefits of a short or long term opioids use as well as potential risks (i.e. addiction risk, apnea etc) and complications (i.e. Somnolence, constipation and others) were explained to the patient and were aknowledged. F/u w/Ortho       Relevant Medications   HYDROcodone -acetaminophen  (NORCO/VICODIN) 5-325 MG tablet   Coronary artery disease due to lipid rich plaque   Total Agatston score: 306  Stress test was (-)      Relevant Orders   Comprehensive metabolic panel with GFR   CBC with Differential/Platelet   Sedimentation rate   T4, free   Urinalysis   Vitamin B12   Hemoglobin A1c   Lipid panel   Brain natriuretic peptide   DG Chest 2 View   DOE (dyspnea on exertion) - Primary   New ?etiology - could be  restrictive lung disease, CHF, etc 2D ECHO CXR BNP, CBC, other labs Loose wt      Relevant Orders   Comprehensive metabolic panel with GFR   CBC with Differential/Platelet   Sedimentation rate   T4, free   Urinalysis   Vitamin B12   Hemoglobin A1c   Lipid panel   Brain natriuretic  peptide   DG Chest 2 View   DG Chest 2 View   ECHOCARDIOGRAM COMPLETE   Hyperglycemia   Check A1c      Relevant Orders   Hemoglobin A1c   Rectal pain   ?etiology Nl rectal exam Re-do cologuard HCT supp at bedtime or bid May need a GI ref      Other Visit Diagnoses       Screening for colon cancer       Relevant Orders   Cologuard         Meds ordered this encounter  Medications   HYDROcodone -acetaminophen  (NORCO/VICODIN) 5-325 MG tablet    Sig: Take 1 tablet by mouth every 6 (six) hours as needed.    Dispense:  20 tablet    Refill:  0   hydrocortisone  (ANUSOL -HC) 25 MG suppository    Sig: Place 1 suppository (25 mg total) rectally 2 (two) times daily.    Dispense:  20 suppository    Refill:  1      Follow-up: Return in about 2 weeks (around 12/15/2024) for a follow-up visit.  Marolyn Noel, MD "

## 2024-12-01 NOTE — Assessment & Plan Note (Signed)
 Total Agatston score: 306  Stress test was (-)

## 2024-12-01 NOTE — Assessment & Plan Note (Signed)
 R>>L Advanced OA Norco prn  Potential benefits of a short or long term opioids use as well as potential risks (i.e. addiction risk, apnea etc) and complications (i.e. Somnolence, constipation and others) were explained to the patient and were aknowledged. F/u w/Ortho

## 2025-01-10 ENCOUNTER — Ambulatory Visit: Admitting: Family Medicine

## 2025-06-19 ENCOUNTER — Ambulatory Visit
